# Patient Record
Sex: Male | Born: 1940 | Race: White | Hispanic: No | Smoking: Former smoker
Health system: Southern US, Community
[De-identification: ages and names within clinical notes are randomized; demographics above are authoritative.]

## PROBLEM LIST (undated history)

## (undated) DIAGNOSIS — I700 Atherosclerosis of aorta: Secondary | ICD-10-CM

## (undated) DIAGNOSIS — C20 Malignant neoplasm of rectum: Secondary | ICD-10-CM

## (undated) DIAGNOSIS — Z7952 Long term (current) use of systemic steroids: Secondary | ICD-10-CM

## (undated) DIAGNOSIS — I4891 Unspecified atrial fibrillation: Secondary | ICD-10-CM

## (undated) DIAGNOSIS — Z7901 Long term (current) use of anticoagulants: Secondary | ICD-10-CM

## (undated) DIAGNOSIS — Z7960 Long term (current) use of unspecified immunomodulators and immunosuppressants: Secondary | ICD-10-CM

## (undated) DIAGNOSIS — I10 Essential (primary) hypertension: Secondary | ICD-10-CM

## (undated) DIAGNOSIS — Z79899 Other long term (current) drug therapy: Secondary | ICD-10-CM

## (undated) DIAGNOSIS — I714 Abdominal aortic aneurysm, without rupture: Secondary | ICD-10-CM

## (undated) DIAGNOSIS — E039 Hypothyroidism, unspecified: Secondary | ICD-10-CM

## (undated) DIAGNOSIS — M069 Rheumatoid arthritis, unspecified: Secondary | ICD-10-CM

## (undated) DIAGNOSIS — K8689 Other specified diseases of pancreas: Secondary | ICD-10-CM

## (undated) DIAGNOSIS — I495 Sick sinus syndrome: Secondary | ICD-10-CM

## (undated) DIAGNOSIS — M359 Systemic involvement of connective tissue, unspecified: Secondary | ICD-10-CM

## (undated) HISTORY — DX: Malignant neoplasm of rectum: C20

## (undated) HISTORY — DX: Other specified diseases of pancreas: K86.89

## (undated) HISTORY — PX: COLONOSCOPY: SHX174

## (undated) HISTORY — DX: Hypothyroidism, unspecified: E03.9

## (undated) HISTORY — DX: Essential (primary) hypertension: I10

## (undated) HISTORY — DX: Rheumatoid arthritis, unspecified: M06.9

## (undated) HISTORY — DX: Abdominal aortic aneurysm, without rupture: I71.4

## (undated) HISTORY — DX: Unspecified atrial fibrillation: I48.91

---

## 1977-06-04 HISTORY — PX: VARICOSE VEIN SURGERY: SHX832

## 1998-06-04 DIAGNOSIS — C20 Malignant neoplasm of rectum: Secondary | ICD-10-CM

## 1998-06-04 HISTORY — PX: TRANSANAL RECTAL RESECTION: SHX2562

## 1998-06-04 HISTORY — DX: Malignant neoplasm of rectum: C20

## 2005-10-04 ENCOUNTER — Other Ambulatory Visit: Payer: Self-pay

## 2005-10-30 ENCOUNTER — Ambulatory Visit: Payer: Self-pay | Admitting: Unknown Physician Specialty

## 2007-03-06 ENCOUNTER — Ambulatory Visit: Payer: Self-pay | Admitting: Surgery

## 2011-08-10 ENCOUNTER — Encounter: Payer: Self-pay | Admitting: Internal Medicine

## 2011-08-28 ENCOUNTER — Encounter: Payer: Self-pay | Admitting: Internal Medicine

## 2011-08-29 ENCOUNTER — Ambulatory Visit (INDEPENDENT_AMBULATORY_CARE_PROVIDER_SITE_OTHER): Payer: Federal, State, Local not specified - PPO | Admitting: Internal Medicine

## 2011-08-29 ENCOUNTER — Encounter: Payer: Self-pay | Admitting: Internal Medicine

## 2011-08-29 ENCOUNTER — Telehealth: Payer: Self-pay | Admitting: Internal Medicine

## 2011-08-29 VITALS — BP 144/72 | HR 76 | Ht 75.0 in | Wt 198.0 lb

## 2011-08-29 DIAGNOSIS — Z85048 Personal history of other malignant neoplasm of rectum, rectosigmoid junction, and anus: Secondary | ICD-10-CM | POA: Insufficient documentation

## 2011-08-29 DIAGNOSIS — Z8679 Personal history of other diseases of the circulatory system: Secondary | ICD-10-CM | POA: Insufficient documentation

## 2011-08-29 DIAGNOSIS — Z1211 Encounter for screening for malignant neoplasm of colon: Secondary | ICD-10-CM

## 2011-08-29 DIAGNOSIS — I10 Essential (primary) hypertension: Secondary | ICD-10-CM | POA: Insufficient documentation

## 2011-08-29 DIAGNOSIS — M069 Rheumatoid arthritis, unspecified: Secondary | ICD-10-CM

## 2011-08-29 DIAGNOSIS — Z85038 Personal history of other malignant neoplasm of large intestine: Secondary | ICD-10-CM

## 2011-08-29 HISTORY — DX: Rheumatoid arthritis, unspecified: M06.9

## 2011-08-29 MED ORDER — PEG-KCL-NACL-NASULF-NA ASC-C 100 G PO SOLR: 1.0000 | Freq: Once | ORAL | Status: DC

## 2011-08-29 NOTE — Telephone Encounter (Signed)
Received 171 pages. Sent to Dr. Rhea Belton. SD 08/29/11.

## 2011-08-29 NOTE — Patient Instructions (Signed)
You have been scheduled for a colonoscopy with propofol. Please follow written instructions given to you at your visit today.  Please pick up your prep kit at the pharmacy within the next 1-3 days.     We have sent the following medications to your pharmacy for you to pick up at your convenience:Moviprep, please follow the instructions that were given to you today at your office visit.

## 2011-08-29 NOTE — Progress Notes (Signed)
Subjective:    Patient ID: Damon Torres, male    DOB: 12/04/1940, 71 y.o.   MRN: [id number]  HPI Damon Torres is a 72 year old male with a past medical history of rectal cancer status post resection, rheumatoid arthritis on Enbrel who is seen in consultation by the request of Dr. Dan Humphreys for evaluation of surveillance colonoscopy. The patient is currently doing well and he is without complaint. No abdominal pain, loss of appetite, nausea, vomiting. No diarrhea or constipation. No blood in the stool or melena. He was diagnosed with rectal cancer at [facility] in 2000 and underwent what sounds like a two-part surgery, with ileal diversion for some period. He then had secondary anastomosis. He reports having colonoscopies initially at year from his diagnosed, 3 years, and then every 5 years. He recalls be due for his surveillance colonoscopy now. No other complaints.  Review of Systems As per history of present illness, otherwise negative  Past Medical History  Diagnosis Date  . 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 Hypertension   . Rheumatoid arthritis    Past Surgical History  Procedure Date  . Transanal rectal resection 2000    [facility]  . Varicose vein surgery 1979   Current Outpatient Prescriptions  Medication Sig Dispense Refill  . amLODipine-benazepril (LOTREL) 5-10 MG per capsule Take 1 capsule by mouth daily.      [name] etanercept (ENBREL) 50 MG/ML injection Inject 50 mg into the skin once a week.      . peg 3350 powder (MOVIPREP) SOLR Take 1 kit (100 g total) by mouth once.  1 kit  0   No Known Allergies  Family History  Problem Relation Age of Onset  . Liver cancer Mother   . Breast cancer Mother     Social History  . Marital Status: Married    Number of Children: 2   Occupational History  . retired [employer]    Social History Main Topics  . Smoking status: Former Games developer  . Smokeless tobacco: None  . Alcohol Use: No  . Drug Use: No          Objective:   Physical Exam BP 144/72  Pulse 76  Ht 6\' 3"  (1.905 m)  Wt 198 lb (89.812 kg)  BMI 24.75 kg/m2 Constitutional: Well-developed and well-nourished. No distress. HEENT: Normocephalic and atraumatic. Oropharynx is clear and moist. No oropharyngeal exudate. Conjunctivae are normal. Pupils are equal round and reactive to light. No scleral icterus. Neck: Neck supple. Trachea midline. Cardiovascular: Normal rate, regular rhythm and intact distal pulses. No M/R/G Pulmonary/chest: Effort normal and breath sounds normal. No wheezing, rales or rhonchi. Abdominal: Soft, nontender, nondistended. Bowel sounds active throughout. There are no masses palpable. No hepatosplenomegaly. Well-healed multiple abdominal scars Extremities: no clubbing, cyanosis, or edema Lymphadenopathy: No cervical adenopathy noted. Neurological: Alert and oriented to person place and time. Skin: Skin is warm and dry. No rashes noted. Psychiatric: Normal mood and affect. Behavior is normal.  Previous records reviewed: Labs obtained, 08/07/2011 WBC 5.7, hemoglobin 12.1, hematocrit 35.8, MCV 97.8, platelets 185 Sodium 139, potassium 4.9, chloride 106, CO2 28.7, BUN 33, creatinine 1.2 Albumin 4.1 Alkaline phosphatase 47, bili 0.3, AST 22, ALT 17 Total protein 7.2  Colonoscopy, date 02/01/2003 -- : Colonic anastomosis in the rectum. The sigmoid, descending colon, splenic flexure, transverse colon, hepatic flexure, ascending colon, cecum, and ileocecal valve are normal.  Colonoscopy, date 11/10/1998 -- one sessile polyp, 4 mm in size, in the transverse  colon. Resected. Pathology: Tubular adenoma, no high-grade dysplasia  Colonoscopy, date 10/05/1999 -- colocolonic anastomosis in the rectum, otherwise normal.    Assessment & Plan:  71 year old male with a past medical history of rectal cancer status post resection, rheumatoid arthritis on Enbrel who is seen in consultation by the request of Dr. Dan Humphreys for evaluation  of surveillance colonoscopy  1. Hx of colon cancer -- the patient has a history of rectal cancer status post resection with colocolonic anastomosis. He is now due for surveillance colonoscopy, and we have discussed this test including the risks and benefits. He is agreeable to proceed. This will be scheduled for him in the coming weeks. He is asymptomatic at present. He is high risk given his history and therefore should continue with the surveillance interval every 5 yrs.

## 2011-09-24 ENCOUNTER — Ambulatory Visit (AMBULATORY_SURGERY_CENTER): Payer: Federal, State, Local not specified - PPO | Admitting: Internal Medicine

## 2011-09-24 ENCOUNTER — Encounter: Payer: Self-pay | Admitting: Internal Medicine

## 2011-09-24 VITALS — BP 154/92 | HR 73 | Temp 97.2°F | Resp 19 | Ht 75.0 in | Wt 198.0 lb

## 2011-09-24 DIAGNOSIS — Z85048 Personal history of other malignant neoplasm of rectum, rectosigmoid junction, and anus: Secondary | ICD-10-CM

## 2011-09-24 DIAGNOSIS — Z860100 Personal history of colon polyps, unspecified: Secondary | ICD-10-CM

## 2011-09-24 DIAGNOSIS — D126 Benign neoplasm of colon, unspecified: Secondary | ICD-10-CM

## 2011-09-24 DIAGNOSIS — K635 Polyp of colon: Secondary | ICD-10-CM

## 2011-09-24 DIAGNOSIS — Z1211 Encounter for screening for malignant neoplasm of colon: Secondary | ICD-10-CM

## 2011-09-24 MED ORDER — SODIUM CHLORIDE 0.9 % IV SOLN: 500.0000 mL | INTRAVENOUS | Status: DC

## 2011-09-24 NOTE — Progress Notes (Signed)
12:35 Molli Hazard, CRNA per Marrion Coy, CRNA hung second bag of normal saline 0.9% bag. Maw  The pt tolerated the colonoscopy very well. Maw

## 2011-09-24 NOTE — Patient Instructions (Signed)
HOLD ASPIRIN PRODUCTS AND ANTIINFLAMMATORIES FOR ONE Damon Torres, UNTIL April 29,2013.  YOU HAD AN ENDOSCOPIC PROCEDURE TODAY AT THE [facility]: Refer to the procedure report that was given to you for any specific questions about what was found during the examination.  If the procedure report does not answer your questions, please call your gastroenterologist to clarify.  If you requested that your care partner not be given the details of your procedure findings, then the procedure report has been included in a sealed envelope for you to review at your convenience later.  YOU SHOULD EXPECT: Some feelings of bloating in the abdomen. Passage of more gas than usual.  Walking can help get rid of the air that was put into your GI tract during the procedure and reduce the bloating. If you had a lower endoscopy (such as a colonoscopy or flexible sigmoidoscopy) you may notice spotting of blood in your stool or on the toilet paper. If you underwent a bowel prep for your procedure, then you may not have a normal bowel movement for a few days.  DIET: Your first meal following the procedure should be a light meal and then it is ok to progress to your normal diet.  A half-sandwich or bowl of soup is an example of a good first meal.  Heavy or fried foods are harder to digest and may make you feel nauseous or bloated.  Likewise meals heavy in dairy and vegetables can cause extra gas to form and this can also increase the bloating.  Drink plenty of fluids but you should avoid alcoholic beverages for 24 hours.  ACTIVITY: Your care partner should take you home directly after the procedure.  You should plan to take it easy, moving slowly for the rest of the day.  You can resume normal activity the day after the procedure however you should NOT DRIVE or use heavy machinery for 24 hours (because of the sedation medicines used during the test).    SYMPTOMS TO REPORT IMMEDIATELY: A gastroenterologist can be reached at  any hour.  During normal business hours, 8:30 AM to 5:00 PM Monday through Friday, call [phone].  After hours and on weekends, please call the GI answering service at [phone] who will take a message and have the physician on call contact you.   Following lower endoscopy (colonoscopy or flexible sigmoidoscopy):  Excessive amounts of blood in the stool  Significant tenderness or worsening of abdominal pains  Swelling of the abdomen that is new, acute  Fever of 100F or higher  Following upper endoscopy (EGD)  Vomiting of blood or coffee ground material  New chest pain or pain under the shoulder blades  Painful or persistently difficult swallowing  New shortness of breath  Fever of 100F or higher  Black, tarry-looking stools  FOLLOW UP: If any biopsies were taken you will be contacted by phone or by letter within the next 1-3 weeks.  Call your gastroenterologist if you have not heard about the biopsies in 3 weeks.  Our staff will call the home number listed on your records the next business day following your procedure to check on you and address any questions or concerns that you may have at that time regarding the information given to you following your procedure. This is a courtesy call and so if there is no answer at the home number and we have not heard from you through the emergency physician on call, we will assume that you have returned to  your regular daily activities without incident.  SIGNATURES/CONFIDENTIALITY: You and/or your care partner have signed paperwork which will be entered into your electronic medical record.  These signatures attest to the fact that that the information above on your After Visit Summary has been reviewed and is understood.  Full responsibility of the confidentiality of this discharge information lies with you and/or your care-partner.

## 2011-09-24 NOTE — Progress Notes (Signed)
Patient did not experience any of the following events: a burn prior to discharge; a fall within the facility; wrong site/side/patient/procedure/implant event; or a hospital transfer or hospital admission upon discharge from the facility. (G8907) Patient did not have preoperative order for IV antibiotic SSI prophylaxis. (G8918)  

## 2011-09-24 NOTE — Op Note (Signed)
[facility] [address] [place], [place]  [address]  COLONOSCOPY PROCEDURE REPORT  PATIENT:  Damon Torres, Damon Torres  MR#:  [id number] BIRTHDATE:  1941-01-22, 71 yrs. old  GENDER:  male ENDOSCOPIST:  Carie Caddy. Loukisha Gunnerson, MD REF. BY:  Ronna Polio, M.D. PROCEDURE DATE:  09/24/2011 PROCEDURE:  Colonoscopy with snare polypectomy ASA CLASS:  Class II INDICATIONS:  history of colon cancer, surveillance and high-risk screening MEDICATIONS:   MAC sedation, administered by CRNA, propofol (Diprivan) 350 mg IV  DESCRIPTION OF PROCEDURE:   After the risks benefits and alternatives of the procedure were thoroughly explained, informed consent was obtained.  Digital rectal exam was performed and revealed no rectal masses.   The LB CF-[id number] endoscope was introduced through the anus and advanced to the terminal ileum which was intubated for a short distance, without limitations. The quality of the prep was good, using MoviPrep.  The instrument was then slowly withdrawn as the colon was fully examined. <<PROCEDUREIMAGES>>  FINDINGS:  The terminal ileum appeared normal.  A 5 mm sessile polyp was found in the ascending colon. Polyp was snared without cautery. Retrieval was successful.  Mild diverticulosis was found in the left colon.  There was a surgical anastomosis in the rectum with very mild erythema, but was otherwise healthy appearing. Unable to retroflex due to prior surgery.   The scope was then withdrawn from the cecum and the procedure completed.  COMPLICATIONS:  None ENDOSCOPIC IMPRESSION: 1) Normal terminal ileum 2) Sessile polyp in the ascending colon. Removed and sent to pathology. 3) Mild diverticulosis in the left colon 4) Anastomosis in the rectum, healthy appearing.  RECOMMENDATIONS: 1) Hold aspirin, aspirin products, and anti-inflammatory medication for 1 week. 2) Await pathology results 3) High fiber diet. 4) Given your personal history rectal cancer and the  1 polyp removed today, you will need a repeat colonoscopy in 5 years.  Carie Caddy. Rhea Belton, MD  CC:  Ronna Polio MD The Patient  n. eSIGNEDCarie Caddy. Mathis Cashman at 09/24/2011 12:49 PM  Marcy Panning, [phone]

## 2011-09-25 ENCOUNTER — Telehealth: Payer: Self-pay

## 2011-09-25 NOTE — Telephone Encounter (Signed)
  Follow up Call-  Call back number 09/24/2011  Post procedure Call Back phone  # [phone]  Permission to leave phone message Yes     Patient questions:  Do you have a fever, pain , or abdominal swelling? no Pain Score  0 *  Have you tolerated food without any problems? yes  Have you been able to return to your normal activities? yes  Do you have any questions about your discharge instructions: Diet   no Medications  no Follow up visit  no  Do you have questions or concerns about your Care? no  Actions: * If pain score is 4 or above: No action needed, pain <4. Per the pt "I am fine no problems". Maw

## 2011-09-28 ENCOUNTER — Encounter: Payer: Self-pay | Admitting: Internal Medicine

## 2012-02-13 ENCOUNTER — Ambulatory Visit (INDEPENDENT_AMBULATORY_CARE_PROVIDER_SITE_OTHER): Payer: Federal, State, Local not specified - PPO | Admitting: Internal Medicine

## 2012-02-13 ENCOUNTER — Encounter: Payer: Self-pay | Admitting: Internal Medicine

## 2012-02-13 VITALS — BP 138/72 | HR 70 | Temp 98.0°F | Ht 74.0 in | Wt 183.0 lb

## 2012-02-13 DIAGNOSIS — M069 Rheumatoid arthritis, unspecified: Secondary | ICD-10-CM

## 2012-02-13 DIAGNOSIS — Z85048 Personal history of other malignant neoplasm of rectum, rectosigmoid junction, and anus: Secondary | ICD-10-CM

## 2012-02-13 DIAGNOSIS — Z23 Encounter for immunization: Secondary | ICD-10-CM

## 2012-02-13 DIAGNOSIS — I10 Essential (primary) hypertension: Secondary | ICD-10-CM

## 2012-02-13 MED ORDER — AMLODIPINE BESY-BENAZEPRIL HCL 5-10 MG PO CAPS: 1.0000 | ORAL_CAPSULE | Freq: Every day | ORAL | Status: DC

## 2012-02-13 NOTE — Assessment & Plan Note (Signed)
Has kept up with colonoscopies No symptoms Bowels are different since surgery---but okay

## 2012-02-13 NOTE — Assessment & Plan Note (Signed)
BP Readings from Last 3 Encounters:  02/13/12 138/72  09/24/11 154/92  08/29/11 144/72   Good control No problems with the meds Labs done at [facility] reviewed--looks fine from March

## 2012-02-13 NOTE — Assessment & Plan Note (Signed)
Doing well on embrel Really quiet Dr Gavin Potters manages this

## 2012-02-13 NOTE — Progress Notes (Signed)
  Subjective:    Patient ID: Damon Torres, male    DOB: July 02, 1940, 71 y.o.   MRN: [id number]  HPI Changing doctors from Dr Dan Humphreys  Had rectal cancer---surgery in 2000 Had ostomy and then reanastamosis Bowels are not regular since then but okay (skips days, then may go multiple times) Occ gets up at night with urgency--some sphincter control issues No pad but occ trouble holding it  Has high blood pressure Goes back 12-15 years Has done well with the current medication Usually well controlled  Rheumatoid arthritis diagnosed ~1990 Sees Dr Lavenia Atlas Now on embrel---has really helped him and he considers it controlled Hands were damaged but have stabilized Retains good function  Has problem with left foot 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       Wears seat belt  HENT: Positive for tinnitus. Negative for hearing loss, congestion, rhinorrhea and dental problem.        Regular with dentist  Eyes: Negative for visual disturbance.       No diplopia or unilateral vision loss  Respiratory: Negative for cough, chest tightness and shortness of breath.   Cardiovascular: Negative for chest pain, palpitations and leg swelling.  Gastrointestinal: Negative for nausea, vomiting, abdominal pain, constipation and blood in stool.       No heartburn  Genitourinary: Negative for urgency, frequency and difficulty urinating.       No sexual problems  Musculoskeletal: Positive for joint swelling and arthralgias. Negative for back pain.  Skin: Negative for rash.       No suspicious areas  Neurological: Negative for dizziness, syncope, weakness, light-headedness, numbness and headaches.  Hematological: Negative for adenopathy. Bruises/bleeds easily.  Psychiatric/Behavioral: Positive for disturbed wake/sleep cycle. Negative for  dysphoric mood. The patient is not nervous/anxious.        Not a great sleeper---chronic No daytime somnolence       Objective:   Physical Exam  Constitutional: He appears well-developed and well-nourished. No distress.  HENT:  Mouth/Throat: Oropharynx is clear and moist. No oropharyngeal exudate.  Eyes: Conjunctivae normal and EOM are normal. Pupils are equal, round, and reactive to light.  Neck: Normal range of motion. Neck supple. No thyromegaly present.  Cardiovascular: Normal rate, regular rhythm, normal heart sounds and intact distal pulses.  Exam reveals no gallop.   No murmur heard.      occ skip beats  Pulmonary/Chest: Effort normal and breath sounds normal. No respiratory distress. He has no wheezes. He has no rales.  Abdominal: Soft. There is no tenderness.  Musculoskeletal: He exhibits no edema and no tenderness.       Thickening in PIP of hands without active synovitis Bilateral bunions but no skin breakdown  Lymphadenopathy:    He has no cervical adenopathy.  Skin: No rash noted. No erythema.       Scattered ecchymoses  Psychiatric: He has a normal mood and affect. His behavior is normal. Thought content normal.          Assessment & Plan:

## 2012-02-19 NOTE — Progress Notes (Signed)
  Subjective:    Patient ID: Yves Fodor, male    DOB: 1941/04/03, 71 y.o.   MRN: [id number]  HPI  Current Outpatient Prescriptions on File Prior to Visit  Medication Sig Dispense Refill  . amLODipine-benazepril (LOTREL) 5-10 MG per capsule Take 1 capsule by mouth daily.  90 capsule  3  . etanercept (ENBREL) 50 MG/ML injection Inject 50 mg into the skin once a week.        No Known Allergies  Past Medical History  Diagnosis Date  . 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 Hypertension   . Rheumatoid arthritis     Past Surgical History  Procedure Date  . Transanal rectal resection 2000    [facility]  . Varicose vein surgery 1979  . Colonoscopy     Family History  Problem Relation Age of Onset  . Liver cancer Mother   . Breast cancer Mother   . Heart disease Mother   . Colon cancer Neg Hx   . Esophageal cancer Neg Hx   . Rectal cancer Neg Hx   . Stomach cancer Neg Hx   . Diabetes Neg Hx   . Heart disease Brother   . Hypertension Brother     History   Social History  . Marital Status: Married    Spouse Name: N/A    Number of Children: 2  . Years of Education: N/A   Occupational History  . 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 Smoking status: Former Games developer  . Smokeless tobacco: Never Used  . Alcohol Use: No  . Drug Use: No  . Sexually Active: Not on file   Other Topics Concern  . Not on file   Social History Narrative   No living willWould want wife to make health care decisionsInfo given on advanced directives 9/11/13Would accept resuscitation attemptsWould not want tube feeds if cognitively unaware     Review of Systems     Objective:   Physical Exam        Assessment & Plan:

## 2012-02-26 DIAGNOSIS — M069 Rheumatoid arthritis, unspecified: Secondary | ICD-10-CM

## 2012-02-26 DIAGNOSIS — M2012 Hallux valgus (acquired), left foot: Secondary | ICD-10-CM | POA: Insufficient documentation

## 2012-02-26 HISTORY — DX: Rheumatoid arthritis, unspecified: M06.9

## 2012-02-26 HISTORY — DX: Hallux valgus (acquired), left foot: M20.12

## 2012-06-04 DIAGNOSIS — E038 Other specified hypothyroidism: Secondary | ICD-10-CM

## 2012-06-04 HISTORY — DX: Other specified hypothyroidism: E03.8

## 2012-07-01 DIAGNOSIS — I10 Essential (primary) hypertension: Secondary | ICD-10-CM

## 2012-07-01 HISTORY — DX: Essential (primary) hypertension: I10

## 2012-07-05 HISTORY — PX: OTHER SURGICAL HISTORY: SHX169

## 2012-09-16 ENCOUNTER — Encounter: Payer: Federal, State, Local not specified - PPO | Admitting: Internal Medicine

## 2012-09-26 ENCOUNTER — Encounter: Payer: Self-pay | Admitting: Internal Medicine

## 2012-09-26 ENCOUNTER — Ambulatory Visit (INDEPENDENT_AMBULATORY_CARE_PROVIDER_SITE_OTHER): Payer: Federal, State, Local not specified - PPO | Admitting: Internal Medicine

## 2012-09-26 VITALS — BP 148/70 | HR 64 | Temp 98.6°F | Ht 75.0 in | Wt 188.0 lb

## 2012-09-26 DIAGNOSIS — Z0000 Encounter for general adult medical examination without abnormal findings: Secondary | ICD-10-CM | POA: Insufficient documentation

## 2012-09-26 DIAGNOSIS — I499 Cardiac arrhythmia, unspecified: Secondary | ICD-10-CM

## 2012-09-26 DIAGNOSIS — I10 Essential (primary) hypertension: Secondary | ICD-10-CM

## 2012-09-26 DIAGNOSIS — M069 Rheumatoid arthritis, unspecified: Secondary | ICD-10-CM

## 2012-09-26 DIAGNOSIS — R350 Frequency of micturition: Secondary | ICD-10-CM

## 2012-09-26 DIAGNOSIS — Z23 Encounter for immunization: Secondary | ICD-10-CM

## 2012-09-26 LAB — LIPID PANEL
Cholesterol: 189 mg/dL (ref 0–200)
LDL Cholesterol: 127 mg/dL — ABNORMAL HIGH (ref 0–99)
Total CHOL/HDL Ratio: 4
Triglycerides: 97 mg/dL (ref 0.0–149.0)
VLDL: 19.4 mg/dL (ref 0.0–40.0)

## 2012-09-26 LAB — HEPATIC FUNCTION PANEL
Alkaline Phosphatase: 56 U/L (ref 39–117)
Bilirubin, Direct: 0.1 mg/dL (ref 0.0–0.3)
Total Bilirubin: 0.5 mg/dL (ref 0.3–1.2)
Total Protein: 7.7 g/dL (ref 6.0–8.3)

## 2012-09-26 LAB — TSH: TSH: 16.1 u[IU]/mL — ABNORMAL HIGH (ref 0.35–5.50)

## 2012-09-26 LAB — BASIC METABOLIC PANEL
CO2: 24 mEq/L (ref 19–32)
Calcium: 9.2 mg/dL (ref 8.4–10.5)
Chloride: 100 mEq/L (ref 96–112)
Sodium: 135 mEq/L (ref 135–145)

## 2012-09-26 NOTE — Assessment & Plan Note (Signed)
BP Readings from Last 3 Encounters:  09/26/12 148/70  02/13/12 138/72  09/24/11 154/92   BP okay Hopefully can improve with better fitness efforts

## 2012-09-26 NOTE — Assessment & Plan Note (Signed)
EKG shows sinus with freq ectopy No action needed

## 2012-09-26 NOTE — Progress Notes (Signed)
Subjective:    Patient ID: Damon Torres, male    DOB: [birth year], 72 y.o.   MRN: [id number]  HPI Here for physical  Had surgery on right foot---toe pinning (with large toe having pin permanently) Still with some ankle swelling on the right---doesn't vary Mildly uncomfortable from the tightness  Still on enbrel from Dr Gavin Potters No recent blood work  Keeps up with colonoscopies  Current Outpatient Prescriptions on File Prior to Visit  Medication Sig Dispense Refill  . amLODipine-benazepril (LOTREL) 5-10 MG per capsule Take 1 capsule by mouth daily.  90 capsule  3  . etanercept (ENBREL) 50 MG/ML injection Inject 50 mg into the skin once a week.       No current facility-administered medications on file prior to visit.    No Known Allergies  Past Medical History  Diagnosis Date  . 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 Hypertension   . Rheumatoid arthritis     Past Surgical History  Procedure Laterality Date  . Transanal rectal resection  2000    [facility]  . Varicose vein surgery  1979  . Colonoscopy    . Right foot surgery Right 2/14    toes pinned    Family History  Problem Relation Age of Onset  . Liver cancer Mother   . Breast cancer Mother   . Heart disease Mother   . Colon cancer Neg Hx   . Esophageal cancer Neg Hx   . Rectal cancer Neg Hx   . Stomach cancer Neg Hx   . Diabetes Neg Hx   . Heart disease Brother   . Hypertension Brother     History   Social History  . Marital Status: Married    Spouse Name: N/A    Number of Children: 2  . Years of Education: N/A   Occupational History  . 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 Smoking status: Former Games developer  . Smokeless tobacco: Never Used  . Alcohol Use: No  . Drug Use: No  . Sexually Active: Not on file   Other Topics Concern  . Not on file   Social History Narrative   No living will   Would want wife to make health  care decisions   Would accept resuscitation attempts   Would not want tube feeds if cognitively unaware   Review of Systems  Constitutional: Negative for fatigue and unexpected weight change.       Weight stable Not going to Y due to foot surgery  HENT: Negative for hearing loss, congestion, rhinorrhea, dental problem and tinnitus.        Regular with dentist  Eyes: Negative for visual disturbance.       No diplopia or unilateral vision loss  Respiratory: Negative for cough, chest tightness and shortness of breath.   Cardiovascular: Positive for leg swelling. Negative for chest pain and palpitations.       Right ankle  Gastrointestinal: Negative for nausea, vomiting, abdominal pain, constipation and blood in stool.       No heartburn  Endocrine: Negative for cold intolerance and heat intolerance.  Genitourinary: Positive for frequency.       Slow stream Nocturia x 1 No sexual stream  Musculoskeletal: Negative for back pain, joint swelling and arthralgias.       Arthritis controlled on enbrel  Skin: Negative for rash.  No suspicious lesions  Allergic/Immunologic: Negative for environmental allergies and immunocompromised state.  Neurological: Negative for dizziness, syncope, weakness, light-headedness, numbness and headaches.  Hematological: Negative for adenopathy. Bruises/bleeds easily.  Psychiatric/Behavioral: Negative for sleep disturbance and dysphoric mood. The patient is not nervous/anxious.        Objective:   Physical Exam  Constitutional: He is oriented to person, place, and time. He appears well-developed and well-nourished. No distress.  HENT:  Head: Normocephalic and atraumatic.  Right Ear: External ear normal.  Left Ear: External ear normal.  Mouth/Throat: Oropharynx is clear and moist. No oropharyngeal exudate.  Eyes: Conjunctivae and EOM are normal. Pupils are equal, round, and reactive to light.  Neck: Normal range of motion. Neck supple. No thyromegaly  present.  Cardiovascular: Normal rate, normal heart sounds and intact distal pulses.  Exam reveals no gallop.   No murmur heard. Irregular (?regular with freq ectopy vs. Irregular)  Pulmonary/Chest: Effort normal and breath sounds normal. No respiratory distress. He has no wheezes. He has no rales.  Abdominal: Soft. There is no tenderness.  Musculoskeletal: He exhibits edema. He exhibits no tenderness.  Right ankle thick with minimal pitting  Lymphadenopathy:    He has no cervical adenopathy.  Neurological: He is alert and oriented to person, place, and time.  Skin: No rash noted. No erythema.  Scattered small ecchymotic areas  Psychiatric: He has a normal mood and affect. His behavior is normal.          Assessment & Plan:

## 2012-09-26 NOTE — Assessment & Plan Note (Signed)
Controlled with the enbrel 

## 2012-09-26 NOTE — Assessment & Plan Note (Signed)
Generally healthy Needs to get back to exercise now that foot is getting better Discussed PSA---will check one more time since it has been 10 years or so since his last check Td to update

## 2012-09-26 NOTE — Addendum Note (Signed)
Addended by: Sueanne Margarita on: 09/26/2012 10:50 AM   Modules accepted: Orders

## 2012-10-16 ENCOUNTER — Other Ambulatory Visit (INDEPENDENT_AMBULATORY_CARE_PROVIDER_SITE_OTHER): Payer: Federal, State, Local not specified - PPO

## 2012-10-16 DIAGNOSIS — E039 Hypothyroidism, unspecified: Secondary | ICD-10-CM

## 2012-10-17 ENCOUNTER — Encounter: Payer: Self-pay | Admitting: Internal Medicine

## 2012-10-21 MED ORDER — LEVOTHYROXINE SODIUM 25 MCG PO TABS: 25.0000 ug | ORAL_TABLET | Freq: Every day | ORAL | Status: DC

## 2012-10-21 NOTE — Addendum Note (Signed)
Addended by: Liane Comber C on: 10/21/2012 12:30 PM   Modules accepted: Orders

## 2012-12-31 ENCOUNTER — Other Ambulatory Visit (INDEPENDENT_AMBULATORY_CARE_PROVIDER_SITE_OTHER): Payer: Federal, State, Local not specified - PPO

## 2012-12-31 DIAGNOSIS — E039 Hypothyroidism, unspecified: Secondary | ICD-10-CM

## 2012-12-31 LAB — TSH: TSH: 12.98 u[IU]/mL — ABNORMAL HIGH (ref 0.35–5.50)

## 2013-01-05 ENCOUNTER — Other Ambulatory Visit: Payer: Self-pay | Admitting: *Deleted

## 2013-01-05 MED ORDER — LEVOTHYROXINE SODIUM 50 MCG PO TABS: 50.0000 ug | ORAL_TABLET | Freq: Every day | ORAL | Status: DC

## 2013-02-17 ENCOUNTER — Other Ambulatory Visit: Payer: Self-pay

## 2013-02-17 MED ORDER — AMLODIPINE BESY-BENAZEPRIL HCL 5-10 MG PO CAPS: 1.0000 | ORAL_CAPSULE | Freq: Every day | ORAL | Status: DC

## 2013-02-17 NOTE — Telephone Encounter (Signed)
Damon Torres left v/m requestinig refill amlodipine benazepril to [facility]. Advised Damon Fontanez done.

## 2013-04-04 DIAGNOSIS — I4891 Unspecified atrial fibrillation: Secondary | ICD-10-CM

## 2013-04-04 HISTORY — DX: Unspecified atrial fibrillation: I48.91

## 2013-04-09 ENCOUNTER — Other Ambulatory Visit: Payer: Self-pay

## 2013-04-12 DIAGNOSIS — I4891 Unspecified atrial fibrillation: Secondary | ICD-10-CM | POA: Insufficient documentation

## 2013-04-12 DIAGNOSIS — I4811 Longstanding persistent atrial fibrillation: Secondary | ICD-10-CM | POA: Insufficient documentation

## 2013-04-14 ENCOUNTER — Encounter: Payer: Self-pay | Admitting: Internal Medicine

## 2013-04-14 DIAGNOSIS — I4891 Unspecified atrial fibrillation: Secondary | ICD-10-CM

## 2013-06-08 DIAGNOSIS — E039 Hypothyroidism, unspecified: Secondary | ICD-10-CM | POA: Insufficient documentation

## 2013-06-08 DIAGNOSIS — Z7901 Long term (current) use of anticoagulants: Secondary | ICD-10-CM

## 2013-06-08 HISTORY — DX: Hypothyroidism, unspecified: E03.9

## 2013-06-08 HISTORY — DX: Long term (current) use of anticoagulants: Z79.01

## 2013-06-09 ENCOUNTER — Encounter: Payer: Self-pay | Admitting: Internal Medicine

## 2013-06-09 MED ORDER — LEVOTHYROXINE SODIUM 75 MCG PO TABS: 75.0000 ug | ORAL_TABLET | Freq: Every day | ORAL | Status: DC

## 2013-07-29 ENCOUNTER — Other Ambulatory Visit: Payer: Self-pay | Admitting: Internal Medicine

## 2013-07-29 DIAGNOSIS — I10 Essential (primary) hypertension: Secondary | ICD-10-CM

## 2013-07-31 ENCOUNTER — Telehealth: Payer: Self-pay | Admitting: Internal Medicine

## 2013-07-31 ENCOUNTER — Other Ambulatory Visit: Payer: Federal, State, Local not specified - PPO

## 2013-07-31 ENCOUNTER — Other Ambulatory Visit (INDEPENDENT_AMBULATORY_CARE_PROVIDER_SITE_OTHER): Payer: Federal, State, Local not specified - PPO

## 2013-07-31 DIAGNOSIS — I10 Essential (primary) hypertension: Secondary | ICD-10-CM

## 2013-07-31 LAB — COMPREHENSIVE METABOLIC PANEL
ALBUMIN: 3.9 g/dL (ref 3.5–5.2)
ALT: 15 U/L (ref 0–53)
AST: 20 U/L (ref 0–37)
Alkaline Phosphatase: 45 U/L (ref 39–117)
BUN: 27 mg/dL — ABNORMAL HIGH (ref 6–23)
CALCIUM: 9.7 mg/dL (ref 8.4–10.5)
CHLORIDE: 102 meq/L (ref 96–112)
CO2: 32 meq/L (ref 19–32)
CREATININE: 1.4 mg/dL (ref 0.4–1.5)
GFR: 53.69 mL/min — AB (ref >60.00)
GLUCOSE: 109 mg/dL — AB (ref 70–99)
POTASSIUM: 4.5 meq/L (ref 3.5–5.1)
Sodium: 137 mEq/L (ref 135–145)
Total Bilirubin: 0.4 mg/dL (ref 0.3–1.2)
Total Protein: 7.5 g/dL (ref 6.0–8.3)

## 2013-07-31 LAB — CBC WITH DIFFERENTIAL/PLATELET
Basophils Absolute: 0 10*3/uL (ref 0.0–0.1)
Basophils Relative: 0.7 % (ref 0.0–3.0)
EOS ABS: 0.3 10*3/uL (ref 0.0–0.7)
Eosinophils Relative: 6.2 % — ABNORMAL HIGH (ref 0.0–5.0)
HCT: 37.4 % — ABNORMAL LOW (ref 39.0–52.0)
Hemoglobin: 12.2 g/dL — ABNORMAL LOW (ref 13.0–17.0)
Lymphocytes Relative: 24.3 % (ref 12.0–46.0)
Lymphs Abs: 1.3 10*3/uL (ref 0.7–4.0)
MCHC: 32.5 g/dL (ref 30.0–36.0)
MCV: 102.3 fl — AB (ref 78.0–100.0)
MONO ABS: 0.6 10*3/uL (ref 0.1–1.0)
Monocytes Relative: 11.3 % (ref 3.0–12.0)
Neutro Abs: 3.2 10*3/uL (ref 1.4–7.7)
Neutrophils Relative %: 57.5 % (ref 43.0–77.0)
PLATELETS: 229 10*3/uL (ref 150.0–400.0)
RBC: 3.66 Mil/uL — AB (ref 4.22–5.81)
RDW: 12.8 % (ref 11.5–14.6)
WBC: 5.6 10*3/uL (ref 4.5–10.5)

## 2013-07-31 LAB — LIPID PANEL
CHOLESTEROL: 175 mg/dL (ref 0–200)
HDL: 49.9 mg/dL (ref >39.00)
LDL Cholesterol: 105 mg/dL — ABNORMAL HIGH (ref 0–99)
TRIGLYCERIDES: 99 mg/dL (ref 0.0–149.0)
Total CHOL/HDL Ratio: 4
VLDL: 19.8 mg/dL (ref 0.0–40.0)

## 2013-07-31 LAB — T4, FREE: Free T4: 0.97 ng/dL (ref 0.60–1.60)

## 2013-07-31 LAB — TSH: TSH: 13.44 u[IU]/mL — ABNORMAL HIGH (ref 0.35–5.50)

## 2013-07-31 NOTE — Telephone Encounter (Signed)
Relevant patient education assigned to patient using Emmi. ° °

## 2013-08-19 ENCOUNTER — Other Ambulatory Visit: Payer: Self-pay | Admitting: Internal Medicine

## 2013-08-19 NOTE — Telephone Encounter (Signed)
Last OV 09/2012.

## 2013-08-19 NOTE — Telephone Encounter (Signed)
rx sent to pharmacy by e-script  

## 2013-08-19 NOTE — Telephone Encounter (Signed)
Okay to fill for 6 months Needs physical within that time

## 2013-08-24 ENCOUNTER — Other Ambulatory Visit: Payer: Self-pay | Admitting: Internal Medicine

## 2013-10-02 ENCOUNTER — Encounter: Payer: Self-pay | Admitting: Internal Medicine

## 2013-10-02 ENCOUNTER — Ambulatory Visit (INDEPENDENT_AMBULATORY_CARE_PROVIDER_SITE_OTHER): Payer: Federal, State, Local not specified - PPO | Admitting: Internal Medicine

## 2013-10-02 VITALS — BP 138/80 | HR 67 | Temp 97.8°F | Ht 75.0 in | Wt 183.0 lb

## 2013-10-02 DIAGNOSIS — I10 Essential (primary) hypertension: Secondary | ICD-10-CM

## 2013-10-02 DIAGNOSIS — Z23 Encounter for immunization: Secondary | ICD-10-CM

## 2013-10-02 DIAGNOSIS — M069 Rheumatoid arthritis, unspecified: Secondary | ICD-10-CM

## 2013-10-02 DIAGNOSIS — Z0000 Encounter for general adult medical examination without abnormal findings: Secondary | ICD-10-CM

## 2013-10-02 DIAGNOSIS — I4891 Unspecified atrial fibrillation: Secondary | ICD-10-CM

## 2013-10-02 NOTE — Assessment & Plan Note (Signed)
Generally healthy No PSA due to age 73 today If off enbrel for some time, would give zostavax then

## 2013-10-02 NOTE — Assessment & Plan Note (Signed)
BP Readings from Last 3 Encounters:  10/02/13 138/80  09/26/12 148/70  02/13/12 138/72   Good control

## 2013-10-02 NOTE — Assessment & Plan Note (Signed)
Rate well controlled On xarelto

## 2013-10-02 NOTE — Assessment & Plan Note (Signed)
Seems to be in remission on the med

## 2013-10-02 NOTE — Progress Notes (Signed)
   Subjective:    Patient ID: Damon Torres, male    DOB: 12-14-1940, 73 y.o.   MRN: [id number]  HPI    Review of Systems     Objective:   Physical Exam  Musculoskeletal: He exhibits edema.  2+ left foot edema          Assessment & Plan:

## 2013-10-02 NOTE — Progress Notes (Signed)
Subjective:    Patient ID: Damon Torres, male    DOB: [birth year], 73 y.o.   MRN: [id number]  HPI Here for physical  EKG in November showed atrial fib Seen by Dr Waylan Boga metoprolol and xarelto  On enbrel for RA This is working well  Still works part time---appraisals. Own business Current Outpatient Prescriptions on File Prior to Visit  Medication Sig Dispense Refill  . amLODipine-benazepril (LOTREL) 5-10 MG per capsule TAKE 1 CAPSULE DAILY  90 capsule  0  . etanercept (ENBREL) 50 MG/ML injection Inject 50 mg into the skin once a week.      . levothyroxine (SYNTHROID, LEVOTHROID) 75 MCG tablet Take 1 tablet (75 mcg total) by mouth daily.  90 tablet  3   No current facility-administered medications on file prior to visit.    No Known Allergies  Past Medical History  Diagnosis Date  . 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 Hypertension   . Rheumatoid arthritis(714.0)   . Subclinical hypothyroidism 2014  . Atrial fibrillation 11/14    Past Surgical History  Procedure Laterality Date  . [facility]anal rectal resection  2000    [facility]  . Varicose vein surgery  1979  . Colonoscopy    . Right foot surgery Right 2/14    toes pinned    Family History  Problem Relation Age of Onset  . Liver cancer Mother   . Breast cancer Mother   . Heart disease Mother   . Colon cancer Neg Hx   . Esophageal cancer Neg Hx   . Rectal cancer Neg Hx   . Stomach cancer Neg Hx   . Diabetes Neg Hx   . Heart disease Brother   . Hypertension Brother     History   Social History  . Marital Status: Married    Spouse Name: N/A    Number of Children: 2  . Years of Education: N/A   Occupational History  . 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 Smoking status: Former Research scientist (life sciences)  . Smokeless tobacco: Never Used  . Alcohol Use: No  . Drug Use: No  . Sexual Activity: Not on file   Other Topics Concern  . Not  on file   Social History Narrative   No living will   Would want wife to make health care decisions   Would accept resuscitation attempts   Would not want tube feeds if cognitively unaware   Review of Systems  Constitutional: Negative for fatigue and unexpected weight change.       Wears seat belt  HENT: Positive for hearing loss. Negative for dental problem and tinnitus.        Regular with dentist  Eyes: Negative for visual disturbance.       No diplopia or unilateral vision loss  Respiratory: Negative for cough, chest tightness and shortness of breath.   Cardiovascular: Positive for leg swelling. Negative for chest pain and palpitations.       Swelling just from foot surgery  Gastrointestinal: Negative for nausea, vomiting, abdominal pain, constipation and blood in stool.       No heartburn  Endocrine: Negative for cold intolerance and heat intolerance.  Genitourinary: Negative for urgency, frequency and difficulty urinating.       No sexual problems  Musculoskeletal: Positive for arthralgias. Negative for back pain and joint swelling.  Still some foot soreness RA is quiet  Skin: Negative for rash.       No suspicious lesions  Allergic/Immunologic: Positive for immunocompromised state. Negative for environmental allergies.       On enbrel  Neurological: Negative for dizziness, syncope, weakness, light-headedness and headaches.  Hematological: Negative for adenopathy. Bruises/bleeds easily.  Psychiatric/Behavioral: Negative for sleep disturbance and dysphoric mood. The patient is not nervous/anxious.        Objective:   Physical Exam  Constitutional: He is oriented to person, place, and time. He appears well-developed and well-nourished. No distress.  HENT:  Head: Normocephalic and atraumatic.  Right Ear: External ear normal.  Left Ear: External ear normal.  Mouth/Throat: Oropharynx is clear and moist. No oropharyngeal exudate.  Eyes: Conjunctivae and EOM are  normal. Pupils are equal, round, and reactive to light.  Neck: Normal range of motion. Neck supple. No thyromegaly present.  Cardiovascular: Normal rate, normal heart sounds and intact distal pulses.  Exam reveals no gallop.   No murmur heard. irregular  Pulmonary/Chest: Effort normal and breath sounds normal. No respiratory distress. He has no wheezes. He has no rales.  Abdominal: Soft. There is no tenderness.  Musculoskeletal: He exhibits no edema and no tenderness.  Lymphadenopathy:    He has no cervical adenopathy.  Neurological: He is alert and oriented to person, place, and time.  Skin: No rash noted. No erythema.  Psychiatric: He has a normal mood and affect. His behavior is normal.          Assessment & Plan:

## 2013-10-02 NOTE — Addendum Note (Signed)
Addended by: Despina Hidden on: 10/02/2013 12:16 PM   Modules accepted: Orders

## 2013-10-02 NOTE — Progress Notes (Signed)
Pre visit review using our clinic review tool, if applicable. No additional management support is needed unless otherwise documented below in the visit note. 

## 2013-10-13 ENCOUNTER — Other Ambulatory Visit: Payer: Self-pay | Admitting: Internal Medicine

## 2014-01-12 ENCOUNTER — Ambulatory Visit (INDEPENDENT_AMBULATORY_CARE_PROVIDER_SITE_OTHER): Payer: Federal, State, Local not specified - PPO | Admitting: Internal Medicine

## 2014-01-12 ENCOUNTER — Encounter: Payer: Self-pay | Admitting: Internal Medicine

## 2014-01-12 VITALS — BP 130/70 | HR 64 | Temp 98.6°F | Wt 180.0 lb

## 2014-01-12 DIAGNOSIS — K6289 Other specified diseases of anus and rectum: Secondary | ICD-10-CM

## 2014-01-12 MED ORDER — HYDROCORTISONE 2.5 % RE CREA: 1.0000 "application " | TOPICAL_CREAM | Freq: Three times a day (TID) | RECTAL | Status: DC | PRN

## 2014-01-12 NOTE — Progress Notes (Signed)
Subjective:    Patient ID: RAEDYN WENKE, male    DOB: 1941/04/17, 73 y.o.   MRN: [id number]  HPI Has a sore rectum Not sure if outside or inside 3-4 weeks Constant sensation Sitz baths have helped Preparation H not much help  No blood in bowl or toilet paper Bowels are irregular for 15 years since the rectal cancer Usually goes several times per day and loose Rarely will have large BM and then not go for 2 days. No episodes where he had injury No pain with passing stool  Last colonoscopy was about 2 years ago  Current Outpatient Prescriptions on File Prior to Visit  Medication Sig Dispense Refill  . amLODipine-benazepril (LOTREL) 5-10 MG per capsule TAKE 1 CAPSULE DAILY  90 capsule  3  . calcium-vitamin D (OSCAL WITH D) 500-200 MG-UNIT per tablet Take 1 tablet by mouth daily with breakfast.      . etanercept (ENBREL) 50 MG/ML injection Inject 50 mg into the skin once a week.      . levothyroxine (SYNTHROID, LEVOTHROID) 75 MCG tablet Take 1 tablet (75 mcg total) by mouth daily.  90 tablet  3  . metoprolol succinate (TOPROL-XL) 25 MG 24 hr tablet Take 25 mg by mouth daily.       . Omega-3 Fatty Acids (FISH OIL) 1000 MG CAPS Take 1 capsule by mouth daily.      . rivaroxaban (XARELTO) 20 MG TABS tablet Take 20 mg by mouth daily with supper.       No current facility-administered medications on file prior to visit.    No Known Allergies  Past Medical History  Diagnosis Date  . 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 Hypertension   . Rheumatoid arthritis(714.0)   . Subclinical hypothyroidism 2014  . Atrial fibrillation 11/14    Past Surgical History  Procedure Laterality Date  . [facility]anal rectal resection  2000    [facility]  . Varicose vein surgery  1979  . Colonoscopy    . Right foot surgery Right 2/14    toes pinned    Family History  Problem Relation Age of Onset  . Liver cancer Mother   . Breast cancer Mother   . Heart disease Mother   . Colon cancer Neg Hx     . Esophageal cancer Neg Hx   . Rectal cancer Neg Hx   . Stomach cancer Neg Hx   . Diabetes Neg Hx   . Heart disease Brother   . Hypertension Brother     History   Social History  . Marital Status: Married    Spouse Name: N/A    Number of Children: 2  . Years of Education: N/A   Occupational History  . 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 Smoking status: Former Research scientist (life sciences)  . Smokeless tobacco: Never Used  . Alcohol Use: No  . Drug Use: No  . Sexual Activity: Not on file   Other Topics Concern  . Not on file   Social History Narrative   No living will   Would want wife to make health care decisions   Would accept resuscitation attempts   Would not want tube feeds if cognitively unaware   Review of Systems No fever Appetite is fine No abdominal pain Weight is stable    Objective:   Physical Exam  Constitutional: He appears well-developed  and well-nourished. No distress.  Abdominal: Soft. He exhibits no distension. There is no tenderness. There is no rebound and no guarding.  Genitourinary:  Mild circumferential irritation at rectum Very small rectal vault but no masses or tenderness No hemorrhoids          Assessment & Plan:

## 2014-01-12 NOTE — Assessment & Plan Note (Signed)
Looks only to be some irritation Will try anusol [facility] If ongoing symptoms, would set up with a local surgeon to check him (surgery 15 years ago and no signs of recurrent cancer--but would just want to make sure)--- Byrnett/Sankar

## 2014-01-12 NOTE — Patient Instructions (Signed)
If the rectal pain is not better in the next couple of weeks, please call for a referral to a surgeon (as we discussed).

## 2014-01-12 NOTE — Progress Notes (Signed)
Pre visit review using our clinic review tool, if applicable. No additional management support is needed unless otherwise documented below in the visit note. 

## 2014-04-23 ENCOUNTER — Ambulatory Visit (INDEPENDENT_AMBULATORY_CARE_PROVIDER_SITE_OTHER): Payer: Federal, State, Local not specified - PPO | Admitting: Internal Medicine

## 2014-04-23 ENCOUNTER — Encounter: Payer: Self-pay | Admitting: Internal Medicine

## 2014-04-23 VITALS — BP 130/80 | HR 66 | Temp 97.8°F | Wt 182.0 lb

## 2014-04-23 DIAGNOSIS — K529 Noninfective gastroenteritis and colitis, unspecified: Secondary | ICD-10-CM | POA: Insufficient documentation

## 2014-04-23 DIAGNOSIS — M79671 Pain in right foot: Secondary | ICD-10-CM | POA: Insufficient documentation

## 2014-04-23 MED ORDER — COLESEVELAM HCL 3.75 G PO PACK: 1.0000 | PACK | Freq: Two times a day (BID) | ORAL | Status: DC

## 2014-04-23 MED ORDER — LEVOTHYROXINE SODIUM 75 MCG PO TABS: 75.0000 ug | ORAL_TABLET | Freq: Every day | ORAL | Status: DC

## 2014-04-23 NOTE — Progress Notes (Signed)
Subjective:    Patient ID: Damon Torres, male    DOB: 1941/03/24, 73 y.o.   MRN: [id number]  HPI Having left foot pain January surgery to repair bunion--did well after that but still some tenderness When he wears a shoe--he has pain across mid foot Notes some veins dilated there Pain starts with walking--better if stops (and takes the pressure off)  Having "too many BMs"--loose but formed 10-20 stools per day---mostly at night  Will have incontinence at night if he is not quick Not as much during the day---doesn't bother him Has been off and on an issue since rectal surgery Worse in past 2 weeks No blood No pain  Current Outpatient Prescriptions on File Prior to Visit  Medication Sig Dispense Refill  . amLODipine-benazepril (LOTREL) 5-10 MG per capsule TAKE 1 CAPSULE DAILY 90 capsule 3  . calcium-vitamin D (OSCAL WITH D) 500-200 MG-UNIT per tablet Take 1 tablet by mouth daily with breakfast.    . etanercept (ENBREL) 50 MG/ML injection Inject 50 mg into the skin once a week.    . levothyroxine (SYNTHROID, LEVOTHROID) 75 MCG tablet Take 1 tablet (75 mcg total) by mouth daily. 90 tablet 3  . metoprolol succinate (TOPROL-XL) 25 MG 24 hr tablet Take 25 mg by mouth daily.     . Omega-3 Fatty Acids (FISH OIL) 1000 MG CAPS Take 1 capsule by mouth daily.    . rivaroxaban (XARELTO) 20 MG TABS tablet Take 20 mg by mouth daily with supper.     No current facility-administered medications on file prior to visit.    No Known Allergies  Past Medical History  Diagnosis Date  . Rectal cancer 2000    At [facility] -stage 3. RT and chemo also  . Hypertension   . Rheumatoid arthritis(714.0)   . Subclinical hypothyroidism 2014  . Atrial fibrillation 11/14    Past Surgical History  Procedure Laterality Date  . [facility]anal rectal resection  2000    [facility]  . Varicose vein surgery  1979  . Colonoscopy    . Right foot surgery Right 2/14    toes pinned    Family History  Problem Relation Age  of Onset  . Liver cancer Mother   . Breast cancer Mother   . Heart disease Mother   . Colon cancer Neg Hx   . Esophageal cancer Neg Hx   . Rectal cancer Neg Hx   . Stomach cancer Neg Hx   . Diabetes Neg Hx   . Heart disease Brother   . Hypertension Brother     History   Social History  . Marital Status: Married    Spouse Name: N/A    Number of Children: 2  . Years of Education: N/A   Occupational History  . 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 Smoking status: Former Research scientist (life sciences)  . Smokeless tobacco: Never Used  . Alcohol Use: No  . Drug Use: No  . Sexual Activity: Not on file   Other Topics Concern  . Not on file   Social History Narrative   No living will   Would want wife to make health care decisions   Would accept resuscitation attempts   Would not want tube feeds if cognitively unaware   Review of Systems No fever Appetite is fine Weight stable    Objective:   Physical Exam  Constitutional: He appears well-developed and  well-nourished. No distress.  Abdominal: Soft. He exhibits no distension. There is no tenderness. There is no rebound and no guarding.  Musculoskeletal:  No foot swelling or pain Ankle negative          Assessment & Plan:

## 2014-04-23 NOTE — Assessment & Plan Note (Signed)
Seems mechanical (slight venous dilation not involved) He will try better shoes (like [employer] sports) Consider eval by Dr Lorelei Pont

## 2014-04-23 NOTE — Patient Instructions (Signed)
Try the welchol once a day and increase to twice a day if your frequent bowel movements continue. If better shoes don't help the foot pain, set up an appointment with Dr Lorelei Pont here.

## 2014-04-23 NOTE — Assessment & Plan Note (Signed)
Seems functional No worrisome features

## 2014-05-11 MED ORDER — LEVOTHYROXINE SODIUM 75 MCG PO TABS: 75.0000 ug | ORAL_TABLET | Freq: Every day | ORAL | Status: DC

## 2014-05-11 NOTE — Addendum Note (Signed)
Addended by: Despina Hidden on: 05/11/2014 12:09 PM   Modules accepted: Orders

## 2014-06-01 ENCOUNTER — Encounter: Payer: Self-pay | Admitting: Internal Medicine

## 2014-06-01 MED ORDER — COLESEVELAM HCL 3.75 G PO PACK: 1.0000 | PACK | Freq: Two times a day (BID) | ORAL | Status: DC

## 2014-06-02 ENCOUNTER — Other Ambulatory Visit: Payer: Self-pay | Admitting: *Deleted

## 2014-06-02 MED ORDER — COLESEVELAM HCL 3.75 G PO PACK: 1.0000 | PACK | Freq: Every day | ORAL | Status: DC

## 2014-06-02 NOTE — Telephone Encounter (Signed)
Received fax from [facility] stating that welchol should be once daily and not twice daily. Spoke with patient and he takes once daily. i will resend back to [facility] corrected

## 2014-07-11 DIAGNOSIS — I7143 Infrarenal abdominal aortic aneurysm, without rupture: Secondary | ICD-10-CM

## 2014-07-11 HISTORY — DX: Infrarenal abdominal aortic aneurysm, without rupture: I71.43

## 2014-08-09 ENCOUNTER — Encounter: Payer: Self-pay | Admitting: Family Medicine

## 2014-08-09 ENCOUNTER — Ambulatory Visit (INDEPENDENT_AMBULATORY_CARE_PROVIDER_SITE_OTHER): Payer: Federal, State, Local not specified - PPO | Admitting: Family Medicine

## 2014-08-09 VITALS — BP 110/60 | HR 63 | Temp 98.3°F | Ht 75.0 in | Wt 183.0 lb

## 2014-08-09 DIAGNOSIS — M069 Rheumatoid arthritis, unspecified: Secondary | ICD-10-CM

## 2014-08-09 DIAGNOSIS — M7742 Metatarsalgia, left foot: Secondary | ICD-10-CM

## 2014-08-09 NOTE — Progress Notes (Signed)
Dr. Frederico Hamman T. Nohealani Medinger, MD, [facility] [place], [address] Phone: [phone] Fax: [phone]  08/09/2014  Patient: Damon Torres, MRN: [id number], DOB: 1940-09-18, 74 y.o.  Primary Physician:  Viviana Simpler, MD  Chief Complaint: Foot Pain  Subjective:   KEREM GILMER is a 74 y.o. very pleasant male patient who presents with the following:  Pleasant gentleman with a long-standing history of rheumatoid arthritis who is on Enbrel for 15 years, and he also within the last 2 years had some foot surgery where he has had functional fusion of the great toe bilaterally as well as some additional forefoot surgery, which appears to be of the third and fifth.  Operative notes are not available.  He today reports having some pain primarily in the forefoot on the left.  He is not having significant numbness or tingling.  He has not had any trauma or accident that he can recall.  B bunionectomy L midfoot is a little swollen.   GW Felipa Evener is Rheum, currently on Enbrel.   R hindfoot valgus L great toe fusion L midfoot pain  Metatarsal bar  Past Medical History, Surgical History, Social History, Family History, Problem List, Medications, and Allergies have been reviewed and updated if relevant.  GEN: No fevers, chills. Nontoxic. Primarily MSK c/o today. MSK: Detailed in the HPI GI: tolerating PO intake without difficulty Neuro: No numbness, parasthesias, or tingling associated. Otherwise the pertinent positives of the ROS are noted above.   Objective:   BP 110/60 mmHg  Pulse 63  Temp(Src) 98.3 F (36.8 C) (Oral)  Ht 6\' 3"  (1.905 m)  Wt 183 lb (83.008 kg)  BMI 22.87 kg/m2   GEN: WDWN, NAD, Non-toxic, Alert & Oriented x 3 HEENT: Atraumatic, Normocephalic.  Ears and Nose: No external deformity. EXTR: No clubbing/cyanosis/edema PSYCH: Normally interactive. Conversant. Not depressed or anxious appearing.  Calm  demeanor.   FEET: B Echymosis: no Edema: no ROM: full LE B Gait: heel toe, non-antalgic, dramatic pronation on the R MT pain: no Callus pattern: none Lateral Mall: NT Medial Mall: NT Talus: NT Navicular: NT Cuboid: NT Calcaneous: NT Metatarsals: L TTP with squeeze, not along shaft 5th MT: NT Phalanges: NT Achilles: NT Plantar Fascia: NT Fat Pad: NT Peroneals: NT Post Tib: NT Great Toe: NO MOTION Ant Drawer: neg ATFL: NT CFL: NT Deltoid: NT Other foot breakdown: none Long arch: R > L Long arch collapse Transverse arch: prior collapse with post-op changes Hindfoot breakdown: dramatic R hindfoot valgus with too many toes sign, L minimal Sensation: intact   Radiology: No results found.  Assessment and Plan:   Metatarsalgia of left foot  Rheumatoid arthritis  >25 minutes spent in face to face time with patient, >50% spent in counselling or coordination of care:  Interestingly, the patient has more foot breakdown on the right, particularly in the longitudinal arch as well as the hindfoot.  He is having all of his symptoms on the left, so I decided to make no correction on the right.  He functionally has a fused first MTP bilaterally secondary to operation.  It is possible that this is creating some translated force into the forefoot laterally and more proximal.  I wanted to start with something basic, and I created him a metatarsal bar to unload this region on a basic sports insole.  He felt that this felt better immediately.  I gave him an extra sheet of poron to try  on other shoes.  I appreciate the opportunity to evaluate this very friendly patient. If you have any question regarding her care or prognosis, do not hesitate to ask.   Signed,  Maud Deed. Chiquetta Langner, MD   Patient's Medications  New Prescriptions   No medications on file  Previous Medications   AMLODIPINE-BENAZEPRIL (LOTREL) 5-10 MG PER CAPSULE    TAKE 1 CAPSULE DAILY   CALCIUM-VITAMIN D (OSCAL WITH D)  500-200 MG-UNIT PER TABLET    Take 1 tablet by mouth daily with breakfast.   COLESEVELAM HCL (WELCHOL) 3.75 G PACK    Take 1 packet by mouth daily.   ETANERCEPT (ENBREL) 50 MG/ML INJECTION    Inject 50 mg into the skin once a week.   LEVOTHYROXINE (SYNTHROID, LEVOTHROID) 75 MCG TABLET    Take 1 tablet (75 mcg total) by mouth daily. PT WOULD LIKE GENERIC   METOPROLOL SUCCINATE (TOPROL-XL) 25 MG 24 HR TABLET    Take 25 mg by mouth daily.    OMEGA-3 FATTY ACIDS (FISH OIL) 1000 MG CAPS    Take 1 capsule by mouth daily.   RIVAROXABAN (XARELTO) 20 MG TABS TABLET    Take 20 mg by mouth daily with supper.  Modified Medications   No medications on file  Discontinued Medications   No medications on file

## 2014-08-09 NOTE — Progress Notes (Signed)
Pre visit review using our clinic review tool, if applicable. No additional management support is needed unless otherwise documented below in the visit note. 

## 2014-08-30 DIAGNOSIS — M19072 Primary osteoarthritis, left ankle and foot: Secondary | ICD-10-CM

## 2014-08-30 HISTORY — DX: Primary osteoarthritis, left ankle and foot: M19.072

## 2014-10-05 ENCOUNTER — Ambulatory Visit (INDEPENDENT_AMBULATORY_CARE_PROVIDER_SITE_OTHER): Payer: Federal, State, Local not specified - PPO | Admitting: Internal Medicine

## 2014-10-05 ENCOUNTER — Encounter: Payer: Self-pay | Admitting: Internal Medicine

## 2014-10-05 VITALS — BP 128/70 | HR 65 | Temp 97.7°F | Ht 75.0 in | Wt 179.0 lb

## 2014-10-05 DIAGNOSIS — M069 Rheumatoid arthritis, unspecified: Secondary | ICD-10-CM | POA: Diagnosis not present

## 2014-10-05 DIAGNOSIS — Z0000 Encounter for general adult medical examination without abnormal findings: Secondary | ICD-10-CM | POA: Diagnosis not present

## 2014-10-05 DIAGNOSIS — I4819 Other persistent atrial fibrillation: Secondary | ICD-10-CM

## 2014-10-05 DIAGNOSIS — I481 Persistent atrial fibrillation: Secondary | ICD-10-CM | POA: Diagnosis not present

## 2014-10-05 DIAGNOSIS — I10 Essential (primary) hypertension: Secondary | ICD-10-CM

## 2014-10-05 DIAGNOSIS — E039 Hypothyroidism, unspecified: Secondary | ICD-10-CM

## 2014-10-05 DIAGNOSIS — E038 Other specified hypothyroidism: Secondary | ICD-10-CM

## 2014-10-05 LAB — COMPREHENSIVE METABOLIC PANEL
ALT: 23 U/L (ref 0–53)
AST: 25 U/L (ref 0–37)
Albumin: 3.7 g/dL (ref 3.5–5.2)
Alkaline Phosphatase: 47 U/L (ref 39–117)
BUN: 23 mg/dL (ref 6–23)
CALCIUM: 9.4 mg/dL (ref 8.4–10.5)
CO2: 29 mEq/L (ref 19–32)
CREATININE: 1.41 mg/dL (ref 0.40–1.50)
Chloride: 106 mEq/L (ref 96–112)
GFR: 52.21 mL/min — ABNORMAL LOW (ref >60.00)
GLUCOSE: 88 mg/dL (ref 70–99)
POTASSIUM: 4.1 meq/L (ref 3.5–5.1)
Sodium: 138 mEq/L (ref 135–145)
Total Bilirubin: 0.4 mg/dL (ref 0.2–1.2)
Total Protein: 7.4 g/dL (ref 6.0–8.3)

## 2014-10-05 LAB — CBC WITH DIFFERENTIAL/PLATELET
BASOS ABS: 0 10*3/uL (ref 0.0–0.1)
Basophils Relative: 0.6 % (ref 0.0–3.0)
EOS PCT: 3.9 % (ref 0.0–5.0)
Eosinophils Absolute: 0.2 10*3/uL (ref 0.0–0.7)
HCT: 36.1 % — ABNORMAL LOW (ref 39.0–52.0)
Hemoglobin: 12.5 g/dL — ABNORMAL LOW (ref 13.0–17.0)
LYMPHS PCT: 19.1 % (ref 12.0–46.0)
Lymphs Abs: 1.1 10*3/uL (ref 0.7–4.0)
MCHC: 34.5 g/dL (ref 30.0–36.0)
MCV: 98.9 fl (ref 78.0–100.0)
MONOS PCT: 12.4 % — AB (ref 3.0–12.0)
Monocytes Absolute: 0.7 10*3/uL (ref 0.1–1.0)
NEUTROS PCT: 64 % (ref 43.0–77.0)
Neutro Abs: 3.6 10*3/uL (ref 1.4–7.7)
PLATELETS: 208 10*3/uL (ref 150.0–400.0)
RBC: 3.66 Mil/uL — ABNORMAL LOW (ref 4.22–5.81)
RDW: 13.5 % (ref 11.5–15.5)
WBC: 5.6 10*3/uL (ref 4.0–10.5)

## 2014-10-05 LAB — LIPID PANEL
CHOL/HDL RATIO: 4
CHOLESTEROL: 175 mg/dL (ref 0–200)
HDL: 47.5 mg/dL (ref >39.00)
LDL Cholesterol: 114 mg/dL — ABNORMAL HIGH (ref 0–99)
NonHDL: 127.5
TRIGLYCERIDES: 67 mg/dL (ref 0.0–149.0)
VLDL: 13.4 mg/dL (ref 0.0–40.0)

## 2014-10-05 LAB — T4, FREE: FREE T4: 0.88 ng/dL (ref 0.60–1.60)

## 2014-10-05 NOTE — Assessment & Plan Note (Signed)
Good rate control On xarelto 

## 2014-10-05 NOTE — Assessment & Plan Note (Signed)
Joints quiet and no pain on Rx

## 2014-10-05 NOTE — Assessment & Plan Note (Signed)
BP Readings from Last 3 Encounters:  10/05/14 128/70  08/09/14 110/60  04/23/14 130/80   Good control Due for labs

## 2014-10-05 NOTE — Progress Notes (Signed)
Subjective:    Patient ID: Damon Torres, male    DOB: 30-May-1941, 74 y.o.   MRN: [id number]  HPI Here for physical  Reviewed foot pain Has had cortisone shots-- helped some Orthotics not indicated Seems better now  On enbrel for his RA Working well Last visit with Dr Jefm Bryant in December  Dr Naida Sleight at [facility] is cardiologist No heart issues No palpitations  No chest pain or SOB No exercise---discussed. He stays busy with yard and house work (here and place in [place])  No more follow up from rectal cancer Last colonoscopy 4/13 with Dr Pyrtle--due 5 years  Current Outpatient Prescriptions on File Prior to Visit  Medication Sig Dispense Refill  . amLODipine-benazepril (LOTREL) 5-10 MG per capsule TAKE 1 CAPSULE DAILY 90 capsule 3  . calcium-vitamin D (OSCAL WITH D) 500-200 MG-UNIT per tablet Take 1 tablet by mouth daily with breakfast.    . etanercept (ENBREL) 50 MG/ML injection Inject 50 mg into the skin once a week.    . metoprolol succinate (TOPROL-XL) 25 MG 24 hr tablet Take 25 mg by mouth daily.     . Omega-3 Fatty Acids (FISH OIL) 1000 MG CAPS Take 1 capsule by mouth daily.    . rivaroxaban (XARELTO) 20 MG TABS tablet Take 20 mg by mouth daily with supper.     No current facility-administered medications on file prior to visit.    No Known Allergies  Past Medical History  Diagnosis Date  . Rectal cancer 2000    At [facility] -stage 3. RT and chemo also  . Hypertension   . Rheumatoid arthritis(714.0)   . Subclinical hypothyroidism 2014  . Atrial fibrillation 11/14    Past Surgical History  Procedure Laterality Date  . [facility]  2000    [facility]  . Varicose vein surgery  1979  . Colonoscopy    . Right foot surgery Right 2/14    toes pinned    Family History  Problem Relation Age of Onset  . Liver cancer Mother   . Breast cancer Mother   . Heart disease Mother   . Colon cancer Neg Hx   . Esophageal cancer Neg Hx   . Rectal cancer Neg Hx   .  Stomach cancer Neg Hx   . Diabetes Neg Hx   . Heart disease Brother   . Hypertension Brother     History   Social History  . Marital Status: Married    Spouse Name: N/A  . Number of Children: 2  . Years of Education: N/A   Occupational History  . 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 Smoking status: Former Research scientist (life sciences)  . Smokeless tobacco: Never Used  . Alcohol Use: No  . Drug Use: No  . Sexual Activity: Not on file   Other Topics Concern  . Not on file   Social History Narrative   No living will   Would want wife to make health care decisions   Would accept resuscitation attempts   Would not want tube feeds if cognitively unaware   Review of Systems  Constitutional: Negative for fatigue and unexpected weight change.       Wears seat belt  HENT: Negative for dental problem, hearing loss and tinnitus.        Keeps up with dentist  Eyes: Negative for visual disturbance.  No diplopia or unilateral vision loss  Gastrointestinal: Negative for nausea, vomiting, abdominal pain, constipation and blood in stool.       No heartburn Still with fecal frequency since his surgery--but no incontinence  Endocrine: Negative for polydipsia and polyuria.  Genitourinary: Negative for urgency, frequency and difficulty urinating.       No sexual problems  Musculoskeletal: Negative for back pain, joint swelling and arthralgias.  Skin: Negative for rash.       No suspicious lesions  Allergic/Immunologic: Negative for environmental allergies and immunocompromised state.  Neurological: Negative for dizziness, syncope, weakness, light-headedness, numbness and headaches.  Hematological: Negative for adenopathy. Bruises/bleeds easily.  Psychiatric/Behavioral: Negative for sleep disturbance and dysphoric mood. The patient is not nervous/anxious.        Objective:   Physical Exam  Constitutional: He appears  well-developed and well-nourished. No distress.  HENT:  Head: Normocephalic and atraumatic.  Right Ear: External ear normal.  Left Ear: External ear normal.  Mouth/Throat: Oropharynx is clear and moist. No oropharyngeal exudate.  Eyes: Conjunctivae and EOM are normal. Pupils are equal, round, and reactive to light.  Neck: Normal range of motion. Neck supple. No thyromegaly present.  Cardiovascular: Normal rate, normal heart sounds and intact distal pulses.  Exam reveals no gallop.   No murmur heard. irregular  Pulmonary/Chest: Effort normal and breath sounds normal. No respiratory distress. He has no wheezes. He has no rales.  Abdominal: Soft. There is no tenderness.  Musculoskeletal: He exhibits no tenderness.  Trace ankle edema  Lymphadenopathy:    He has no cervical adenopathy.  Skin: No rash noted. No erythema.  Psychiatric: He has a normal mood and affect. His behavior is normal.          Assessment & Plan:

## 2014-10-05 NOTE — Assessment & Plan Note (Signed)
Healthy but should work on fitness No zostavax due to enbrel No PSA due to age Colonoscopy due 2018

## 2014-10-05 NOTE — Assessment & Plan Note (Signed)
Persistently high TSH even with meds---but normal free T4 Will recheck

## 2014-10-05 NOTE — Progress Notes (Signed)
Pre visit review using our clinic review tool, if applicable. No additional management support is needed unless otherwise documented below in the visit note. 

## 2014-11-06 ENCOUNTER — Other Ambulatory Visit: Payer: Self-pay | Admitting: Internal Medicine

## 2014-11-08 ENCOUNTER — Encounter: Payer: Self-pay | Admitting: Internal Medicine

## 2014-12-03 ENCOUNTER — Encounter: Admission: RE | Payer: Self-pay | Source: Ambulatory Visit

## 2014-12-03 ENCOUNTER — Ambulatory Visit
Admission: RE | Admit: 2014-12-03 | Payer: Federal, State, Local not specified - PPO | Source: Ambulatory Visit | Admitting: Gastroenterology

## 2014-12-03 SURGERY — COLONOSCOPY WITH PROPOFOL
Anesthesia: General

## 2015-01-11 ENCOUNTER — Encounter: Payer: Self-pay | Admitting: Internal Medicine

## 2015-01-11 ENCOUNTER — Ambulatory Visit (INDEPENDENT_AMBULATORY_CARE_PROVIDER_SITE_OTHER): Payer: Federal, State, Local not specified - PPO | Admitting: Internal Medicine

## 2015-01-11 VITALS — BP 128/70 | HR 63 | Temp 98.6°F | Wt 174.0 lb

## 2015-01-11 DIAGNOSIS — K529 Noninfective gastroenteritis and colitis, unspecified: Secondary | ICD-10-CM | POA: Diagnosis not present

## 2015-01-11 DIAGNOSIS — E039 Hypothyroidism, unspecified: Secondary | ICD-10-CM | POA: Diagnosis not present

## 2015-01-11 LAB — TSH: TSH: 9.03 u[IU]/mL — ABNORMAL HIGH (ref 0.35–4.50)

## 2015-01-11 LAB — T4, FREE: Free T4: 1.07 ng/dL (ref 0.60–1.60)

## 2015-01-11 NOTE — Assessment & Plan Note (Signed)
He seems to be euthryoid Not sure that this could be related to his loose stools I don't think I would increase the dose unless TSH is over 10

## 2015-01-11 NOTE — Progress Notes (Signed)
   Subjective:    Patient ID: Damon Torres, male    DOB: 1941-04-12, 74 y.o.   MRN: [id number]  HPI Saw Dr Rayann Heman at [facility] for chronic diarrhea No difference on the creon Now planning on going ahead with the colonoscopy  He is wondering if the thyroid is okay and whether that could have anything to do with the diarrhea  Weight is slowing going down Trying to eat more though  No change in skin, hair or nails  Current Outpatient Prescriptions on File Prior to Visit  Medication Sig Dispense Refill  . amLODipine-benazepril (LOTREL) 5-10 MG per capsule TAKE 1 CAPSULE DAILY 90 capsule 3  . calcium-vitamin D (OSCAL WITH D) 500-200 MG-UNIT per tablet Take 1 tablet by mouth daily with breakfast.    . etanercept (ENBREL) 50 MG/ML injection Inject 50 mg into the skin once a week.    . levothyroxine (SYNTHROID, LEVOTHROID) 75 MCG tablet Take 75 mcg by mouth daily before breakfast.    . metoprolol succinate (TOPROL-XL) 25 MG 24 hr tablet Take 25 mg by mouth daily.     . Omega-3 Fatty Acids (FISH OIL) 1000 MG CAPS Take 1 capsule by mouth daily.    . rivaroxaban (XARELTO) 20 MG TABS tablet Take 20 mg by mouth daily with supper.     No current facility-administered medications on file prior to visit.    No Known Allergies  Past Medical History  Diagnosis Date  . Rectal cancer 2000    At [facility] -stage 3. RT and chemo also  . Hypertension   . Rheumatoid arthritis(714.0)   . Subclinical hypothyroidism 2014  . Atrial fibrillation 11/14    Past Surgical History  Procedure Laterality Date  . Transanal rectal resection  2000    [facility]  . Varicose vein surgery  1979  . Colonoscopy    . Right foot surgery Right 2/14    toes pinned    Family History  Problem Relation Age of Onset  . Liver cancer Mother   . Breast cancer Mother   . Heart disease Mother   . Colon cancer Neg Hx   . Esophageal cancer Neg Hx   . Rectal cancer Neg Hx   . Stomach cancer Neg Hx   . Diabetes Neg Hx   . Heart  disease Brother   . Hypertension Brother     History   Social History  . Marital Status: Married    Spouse Name: N/A  . Number of Children: 2  . Years of Education: N/A   Occupational History  . 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 Smoking status: Former Research scientist (life sciences)  . Smokeless tobacco: Never Used  . Alcohol Use: No  . Drug Use: No  . Sexual Activity: Not on file   Other Topics Concern  . Not on file   Social History Narrative   No living will   Would want wife to make health care decisions   Would accept resuscitation attempts   Would not want tube feeds if cognitively unaware   Review of Systems Not sleeping well---due to loose stools that wake him up  Some fecal incontinence at night--needs Attends then    Objective:   Physical Exam        Assessment & Plan:

## 2015-01-11 NOTE — Assessment & Plan Note (Signed)
With weight loss Currently undergoing GI evaluation

## 2015-01-11 NOTE — Progress Notes (Signed)
Pre visit review using our clinic review tool, if applicable. No additional management support is needed unless otherwise documented below in the visit note. 

## 2015-01-27 ENCOUNTER — Encounter
Admission: RE | Disposition: A | Discharge status: Home / Self Care | Payer: Self-pay | Source: Ambulatory Visit | Attending: Gastroenterology

## 2015-01-27 ENCOUNTER — Encounter: Payer: Self-pay | Admitting: Anesthesiology

## 2015-01-27 ENCOUNTER — Ambulatory Visit
Admission: RE | Admit: 2015-01-27 | Discharge: 2015-01-27 | Disposition: A | Discharge status: Home / Self Care | Payer: Federal, State, Local not specified - PPO | Source: Ambulatory Visit | Attending: Gastroenterology | Admitting: Gastroenterology

## 2015-01-27 ENCOUNTER — Ambulatory Visit: Payer: Federal, State, Local not specified - PPO | Admitting: Anesthesiology

## 2015-01-27 DIAGNOSIS — M069 Rheumatoid arthritis, unspecified: Secondary | ICD-10-CM | POA: Diagnosis not present

## 2015-01-27 DIAGNOSIS — I10 Essential (primary) hypertension: Secondary | ICD-10-CM | POA: Diagnosis not present

## 2015-01-27 DIAGNOSIS — Z85048 Personal history of other malignant neoplasm of rectum, rectosigmoid junction, and anus: Secondary | ICD-10-CM | POA: Insufficient documentation

## 2015-01-27 DIAGNOSIS — K635 Polyp of colon: Secondary | ICD-10-CM | POA: Diagnosis not present

## 2015-01-27 DIAGNOSIS — E02 Subclinical iodine-deficiency hypothyroidism: Secondary | ICD-10-CM | POA: Diagnosis not present

## 2015-01-27 DIAGNOSIS — Z803 Family history of malignant neoplasm of breast: Secondary | ICD-10-CM | POA: Diagnosis not present

## 2015-01-27 DIAGNOSIS — Z87891 Personal history of nicotine dependence: Secondary | ICD-10-CM | POA: Insufficient documentation

## 2015-01-27 DIAGNOSIS — R197 Diarrhea, unspecified: Secondary | ICD-10-CM | POA: Diagnosis present

## 2015-01-27 DIAGNOSIS — K6389 Other specified diseases of intestine: Secondary | ICD-10-CM | POA: Diagnosis not present

## 2015-01-27 DIAGNOSIS — I4891 Unspecified atrial fibrillation: Secondary | ICD-10-CM | POA: Insufficient documentation

## 2015-01-27 DIAGNOSIS — Z9221 Personal history of antineoplastic chemotherapy: Secondary | ICD-10-CM | POA: Diagnosis not present

## 2015-01-27 DIAGNOSIS — K529 Noninfective gastroenteritis and colitis, unspecified: Secondary | ICD-10-CM | POA: Insufficient documentation

## 2015-01-27 DIAGNOSIS — Z800 Family history of malignant neoplasm of digestive organs: Secondary | ICD-10-CM | POA: Insufficient documentation

## 2015-01-27 HISTORY — PX: COLONOSCOPY WITH PROPOFOL: SHX5780

## 2015-01-27 SURGERY — COLONOSCOPY WITH PROPOFOL
Anesthesia: General

## 2015-01-27 MED ORDER — SODIUM CHLORIDE 0.9 % IV SOLN: INTRAVENOUS | Status: DC

## 2015-01-27 MED ORDER — PHENYLEPHRINE HCL 10 MG/ML IJ SOLN
INTRAMUSCULAR | Status: DC | PRN
  Administered 2015-01-27 (×2): 100 ug via INTRAVENOUS

## 2015-01-27 MED ORDER — SODIUM CHLORIDE 0.9 % IV SOLN
INTRAVENOUS | Status: DC
  Administered 2015-01-27: 1000 mL via INTRAVENOUS

## 2015-01-27 MED ORDER — PROPOFOL 10 MG/ML IV BOLUS
INTRAVENOUS | Status: DC | PRN
  Administered 2015-01-27: 60 mg via INTRAVENOUS

## 2015-01-27 MED ORDER — PROPOFOL INFUSION 10 MG/ML OPTIME
INTRAVENOUS | Status: DC | PRN
  Administered 2015-01-27: 200 ug/kg/min via INTRAVENOUS

## 2015-01-27 MED ORDER — LIDOCAINE HCL (CARDIAC) 20 MG/ML IV SOLN
INTRAVENOUS | Status: DC | PRN
  Administered 2015-01-27: 60 mg via INTRAVENOUS

## 2015-01-27 NOTE — Transfer of Care (Signed)
Immediate Anesthesia Transfer of Care Note  Patient: Damon Torres  Procedure(s) Performed: Procedure(s): COLONOSCOPY WITH PROPOFOL (N/A)  Patient Location: PACU  Anesthesia Type:General  Level of Consciousness: awake  Airway & Oxygen Therapy: Patient Spontanous Breathing and Patient connected to nasal cannula oxygen  Post-op Assessment: Report given to RN and Post -op Vital signs reviewed and stable  Post vital signs: Reviewed and stable  Last Vitals:  Filed Vitals:   01/27/15 1039  BP: 108/66  Pulse: 73  Temp: 35.9 C  Resp: 18    Complications: No apparent anesthesia complications

## 2015-01-27 NOTE — H&P (Signed)
Primary Care Physician:  Viviana Simpler, MD  Pre-Procedure History & Physical: HPI:  Damon Torres is a 74 y.o. male is here for an colonoscopy.   Past Medical History  Diagnosis Date  . Rectal cancer 2000    At [facility] -stage 3. RT and chemo also  . Hypertension   . Rheumatoid arthritis(714.0)   . Subclinical hypothyroidism 2014  . Atrial fibrillation 11/14    Past Surgical History  Procedure Laterality Date  . [facility]anal rectal resection  2000    [facility]  . Varicose vein surgery  1979  . Colonoscopy    . Right foot surgery Right 2/14    toes pinned    Prior to Admission medications   Medication Sig Start Date End Date Taking? Authorizing Provider  amLODipine-benazepril (LOTREL) 5-10 MG per capsule TAKE 1 CAPSULE DAILY 11/06/14  Yes Venia Carbon, MD  calcium-vitamin D (OSCAL WITH D) 500-200 MG-UNIT per tablet Take 1 tablet by mouth daily with breakfast.   Yes Historical Provider, MD  etanercept (ENBREL) 50 MG/ML injection Inject 50 mg into the skin once a week.   Yes Historical Provider, MD  levothyroxine (SYNTHROID, LEVOTHROID) 75 MCG tablet Take 75 mcg by mouth daily before breakfast.   Yes Historical Provider, MD  metoprolol succinate (TOPROL-XL) 25 MG 24 hr tablet Take 25 mg by mouth daily.  09/10/13  Yes Historical Provider, MD  Omega-3 Fatty Acids (FISH OIL) 1000 MG CAPS Take 1 capsule by mouth daily.   Yes Historical Provider, MD  Pancrelipase, Lip-Prot-Amyl, [id number] UNITS CPEP Take by mouth. 12/01/14 12/01/15 Yes Historical Provider, MD  rivaroxaban (XARELTO) 20 MG TABS tablet Take 20 mg by mouth daily with supper.   Yes Historical Provider, MD    Allergies as of 01/26/2015  . (No Known Allergies)    Family History  Problem Relation Age of Onset  . Liver cancer Mother   . Breast cancer Mother   . Heart disease Mother   . Colon cancer Neg Hx   . Esophageal cancer Neg Hx   . Rectal cancer Neg Hx   . Stomach cancer Neg Hx   . Diabetes Neg Hx   . Heart disease Brother    . Hypertension Brother     Social History   Social History  . Marital Status: Married    Spouse Name: N/A  . Number of Children: 2  . Years of Education: N/A   Occupational History  . 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 Smoking status: Former Research scientist (life sciences)  . Smokeless tobacco: Never Used  . Alcohol Use: No  . Drug Use: No  . Sexual Activity: Not on file   Other Topics Concern  . Not on file   Social History Narrative   No living will   Would want wife to make health care decisions   Would accept resuscitation attempts   Would not want tube feeds if cognitively unaware     Physical Exam: BP 124/80 mmHg  Pulse 85  Temp(Src) 96.8 F (36 C) (Tympanic)  Resp 16  Ht 6\' 3"  (1.905 m)  Wt 78.019 kg (172 lb)  BMI 21.50 kg/m2  SpO2 100% General:   Alert,  pleasant and cooperative in NAD Head:  Normocephalic and atraumatic. Neck:  Supple; no masses or thyromegaly. Lungs:  Clear throughout to auscultation.    Heart:  Regular rate and rhythm. Abdomen:  Soft, nontender  and nondistended. Normal bowel sounds, without guarding, and without rebound.   Neurologic:  Alert and  oriented x4;  grossly normal neurologically.  Impression/Plan: Damon Torres is here for an colonoscopy to be performed for chronic diarrhea  Risks, benefits, limitations, and alternatives regarding  colonoscopy have been reviewed with the patient.  Questions have been answered.  All parties agreeable.   Josefine Class, MD  01/27/2015, 10:05 AM

## 2015-01-27 NOTE — Discharge Instructions (Signed)

## 2015-01-27 NOTE — Anesthesia Preprocedure Evaluation (Signed)
Anesthesia Evaluation  Patient identified by MRN, date of birth, ID band Patient awake    Reviewed: Allergy & Precautions, NPO status , Patient's Chart, lab work & pertinent test results, reviewed documented beta blocker date and time   Airway Mallampati: II  TM Distance: >3 FB     Dental  (+) Partial Lower, Upper Dentures, Chipped   Pulmonary former smoker,          Cardiovascular hypertension, Pt. on medications and Pt. on home beta blockers     Neuro/Psych    GI/Hepatic   Endo/Other  Hypothyroidism   Renal/GU      Musculoskeletal  (+) Arthritis -, Rheumatoid disorders,    Abdominal   Peds  Hematology   Anesthesia Other Findings   Reproductive/Obstetrics                             Anesthesia Physical Anesthesia Plan  ASA: III  Anesthesia Plan: General   Post-op Pain Management:    Induction:   Airway Management Planned: Nasal Cannula  Additional Equipment:   Intra-op Plan:   Post-operative Plan:   Informed Consent: I have reviewed the patients History and Physical, chart, labs and discussed the procedure including the risks, benefits and alternatives for the proposed anesthesia with the patient or authorized representative who has indicated his/her understanding and acceptance.     Plan Discussed with: CRNA  Anesthesia Plan Comments:         Anesthesia Quick Evaluation

## 2015-01-27 NOTE — Op Note (Signed)
[facility] Gastroenterology Patient Name: Damon Torres Procedure Date: 01/27/2015 9:59 AM MRN: [id number] Account #: [id number] Date of Birth: 1940-08-01 Admit Type: Outpatient Age: 75 Room: [facility] ENDO ROOM 1 Gender: Male Note Status: Finalized Procedure:         Colonoscopy Indications:       Last colonoscopy 3 years ago, Chronic diarrhea Patient Profile:   This is a 74 year old male. Providers:         Gerrit Heck. Rayann Heman, MD Referring MD:      Venia Carbon, MD (Referring MD) Medicines:         Propofol per Anesthesia Complications:     No immediate complications. Procedure:         Pre-Anesthesia Assessment:                    - Prior to the procedure, a History and Physical was                     performed, and patient medications, allergies and                     sensitivities were reviewed. The patient's tolerance of                     previous anesthesia was reviewed.                    After obtaining informed consent, the colonoscope was                     passed under direct vision. Throughout the procedure, the                     patient's blood pressure, pulse, and oxygen saturations                     were monitored continuously. The Colonoscope was                     introduced through the anus and advanced to the the                     terminal ileum. The colonoscopy was performed without                     difficulty. The patient tolerated the procedure well. The                     quality of the bowel preparation was excellent. Findings:      The perianal and digital rectal examinations were normal.      The terminal ileum appeared normal.      There was evidence of a prior end-to-end colo-rectal anastomosis in the       rectum. This was patent. This was characterized by healthy appearing       mucosa. This was traversed.      A 2 mm polyp was found in the proximal transverse colon. The polyp was       sessile. The polyp was removed  with a jumbo cold forceps. Resection and       retrieval were complete.      Biopsies for histology were taken with a cold forceps from the right       colon, left colon and rectum for evaluation of microscopic colitis.  Impression:        - The examined portion of the ileum was normal.                    - Patent end-to-end colo-rectal anastomosis, characterized                     by healthy appearing mucosa.                    - One 2 mm polyp in the proximal transverse colon.                     Resected and retrieved.                    - Biopsies were taken with a cold forceps from the right                     colon, left colon and rectum for evaluation of microscopic                     colitis.                    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 Recommendation:    - Observe patient in GI recovery unit.                    - Resume regular diet.                    - Return to GI clinic. Consdier trial of xifaxin, 24 hour                     stool collection for lytes and osms, fecal fat.                    - Continue present medications.                    - Await pathology results.                    - Repeat colonoscopy in 5 years for surveillance.                    - The findings and recommendations were discussed with the                     patient.                    - The findings and recommendations were discussed with the                     patient's family. Procedure Code(s): --- Professional ---                    [phone], Colonoscopy, flexible; with biopsy, single or                     multiple CPT copyright 2014 American Medical Association. All rights reserved. The codes documented in this report are preliminary and upon coder review may  be revised to meet current compliance requirements. Mellody Life, MD 01/27/2015 10:37:18 AM This report has been signed electronically. Number of Addenda: 0 Note Initiated On: 01/27/2015 9:59 AM Scope Withdrawal  Time: 0 hours 12  minutes 49 seconds  Total Procedure Duration: 0 hours 14 minutes 44 seconds       [facility]

## 2015-01-28 ENCOUNTER — Other Ambulatory Visit: Payer: Self-pay | Admitting: Gastroenterology

## 2015-01-28 DIAGNOSIS — R197 Diarrhea, unspecified: Secondary | ICD-10-CM

## 2015-01-28 DIAGNOSIS — R634 Abnormal weight loss: Secondary | ICD-10-CM

## 2015-01-28 NOTE — Anesthesia Postprocedure Evaluation (Signed)
  Anesthesia Post-op Note  Patient: Damon Torres  Procedure(s) Performed: Procedure(s): COLONOSCOPY WITH PROPOFOL (N/A)  Anesthesia type:General  Patient location: PACU  Post pain: Pain level controlled  Post assessment: Post-op Vital signs reviewed, Patient's Cardiovascular Status Stable, Respiratory Function Stable, Patent Airway and No signs of Nausea or vomiting  Post vital signs: Reviewed and stable  Last Vitals:  Filed Vitals:   01/27/15 1110  BP: 130/91  Pulse: 64  Temp:   Resp: 18    Level of consciousness: awake, alert  and patient cooperative  Complications: No apparent anesthesia complications

## 2015-01-31 LAB — SURGICAL PATHOLOGY

## 2015-02-09 ENCOUNTER — Ambulatory Visit
Admission: RE | Admit: 2015-02-09 | Discharge: 2015-02-09 | Disposition: A | Discharge status: Home / Self Care | Payer: Federal, State, Local not specified - PPO | Source: Ambulatory Visit | Attending: Gastroenterology | Admitting: Gastroenterology

## 2015-02-09 DIAGNOSIS — R634 Abnormal weight loss: Secondary | ICD-10-CM | POA: Diagnosis present

## 2015-02-09 DIAGNOSIS — N281 Cyst of kidney, acquired: Secondary | ICD-10-CM | POA: Insufficient documentation

## 2015-02-09 DIAGNOSIS — J849 Interstitial pulmonary disease, unspecified: Secondary | ICD-10-CM | POA: Insufficient documentation

## 2015-02-09 DIAGNOSIS — R197 Diarrhea, unspecified: Secondary | ICD-10-CM

## 2015-02-09 DIAGNOSIS — K529 Noninfective gastroenteritis and colitis, unspecified: Secondary | ICD-10-CM | POA: Insufficient documentation

## 2015-02-09 DIAGNOSIS — I714 Abdominal aortic aneurysm, without rupture: Secondary | ICD-10-CM | POA: Diagnosis not present

## 2015-02-09 HISTORY — DX: Systemic involvement of connective tissue, unspecified: M35.9

## 2015-02-09 MED ORDER — IOHEXOL 350 MG/ML SOLN
100.0000 mL | Freq: Once | INTRAVENOUS | Status: AC | PRN
  Administered 2015-02-09: 100 mL via INTRAVENOUS

## 2015-03-29 ENCOUNTER — Other Ambulatory Visit: Payer: Self-pay | Admitting: *Deleted

## 2015-03-29 MED ORDER — LEVOTHYROXINE SODIUM 75 MCG PO TABS: 75.0000 ug | ORAL_TABLET | Freq: Every day | ORAL | Status: DC

## 2015-05-05 DIAGNOSIS — I714 Abdominal aortic aneurysm, without rupture, unspecified: Secondary | ICD-10-CM

## 2015-05-05 HISTORY — DX: Abdominal aortic aneurysm, without rupture, unspecified: I71.40

## 2015-05-05 HISTORY — DX: Abdominal aortic aneurysm, without rupture: I71.4

## 2015-05-09 ENCOUNTER — Other Ambulatory Visit: Payer: Self-pay | Admitting: Gastroenterology

## 2015-05-09 DIAGNOSIS — K8689 Other specified diseases of pancreas: Secondary | ICD-10-CM

## 2015-05-17 ENCOUNTER — Ambulatory Visit: Payer: Federal, State, Local not specified - PPO | Admitting: Physical Therapy

## 2015-05-31 ENCOUNTER — Ambulatory Visit
Admission: RE | Admit: 2015-05-31 | Discharge: 2015-05-31 | Disposition: A | Discharge status: Home / Self Care | Payer: Federal, State, Local not specified - PPO | Source: Ambulatory Visit | Attending: Gastroenterology | Admitting: Gastroenterology

## 2015-05-31 DIAGNOSIS — K8689 Other specified diseases of pancreas: Secondary | ICD-10-CM

## 2015-05-31 DIAGNOSIS — N281 Cyst of kidney, acquired: Secondary | ICD-10-CM | POA: Diagnosis not present

## 2015-05-31 DIAGNOSIS — I714 Abdominal aortic aneurysm, without rupture: Secondary | ICD-10-CM | POA: Insufficient documentation

## 2015-05-31 MED ORDER — GADOBENATE DIMEGLUMINE 529 MG/ML IV SOLN
20.0000 mL | Freq: Once | INTRAVENOUS | Status: AC | PRN
  Administered 2015-05-31: 16 mL via INTRAVENOUS

## 2015-06-14 ENCOUNTER — Encounter: Payer: Self-pay | Admitting: Internal Medicine

## 2015-06-14 DIAGNOSIS — I714 Abdominal aortic aneurysm, without rupture, unspecified: Secondary | ICD-10-CM | POA: Insufficient documentation

## 2015-06-14 DIAGNOSIS — K8689 Other specified diseases of pancreas: Secondary | ICD-10-CM

## 2015-08-11 LAB — HM SIGMOIDOSCOPY: HM SIGMOIDOSCOPY: NORMAL

## 2015-08-15 ENCOUNTER — Ambulatory Visit: Payer: Federal, State, Local not specified - PPO | Attending: Gastroenterology | Admitting: Physical Therapy

## 2015-08-15 DIAGNOSIS — M62838 Other muscle spasm: Secondary | ICD-10-CM | POA: Diagnosis present

## 2015-08-15 DIAGNOSIS — R293 Abnormal posture: Secondary | ICD-10-CM

## 2015-08-15 DIAGNOSIS — R531 Weakness: Secondary | ICD-10-CM | POA: Insufficient documentation

## 2015-08-15 NOTE — Patient Instructions (Signed)
Pelvic tilting in sidelying (pillow between knees)  And also in sitting  10 x 3 / day

## 2015-08-16 NOTE — Therapy (Signed)
[place] MAIN [facility] SERVICES [address] [place], [place], [address] Phone: [phone]   Fax:  [phone]  Physical Therapy Evaluation  Patient Details  Name: Damon Torres MRN: [id number] Date of Birth: Aug 20, 1940 Referring Provider: Rayann Heman   Encounter Date: 08/15/2015      PT End of Session - 08/16/15 1945    Visit Number 1   Number of Visits 12   Date for PT Re-Evaluation 11/07/15   PT Start Time 1000   PT Stop Time 1105   PT Time Calculation (min) 65 min   Activity Tolerance Patient tolerated treatment well;No increased pain   Behavior During Therapy [facility] for tasks assessed/performed      Past Medical History  Diagnosis Date  . Rectal cancer ([place]) 2000    At [facility] -stage 3. RT and chemo also  . Hypertension   . Rheumatoid arthritis(714.0)   . Subclinical hypothyroidism 2014  . Atrial fibrillation ([place]) 11/14  . Collagen vascular disease ([place])   . AAA (abdominal aortic aneurysm) ([place]) 12/16    4cm infrarenal  . Pancreatic insufficiency [facility])     Past Surgical History  Procedure Laterality Date  . Transanal rectal resection  2000    [facility]  . Varicose vein surgery  1979  . Colonoscopy    . Right foot surgery Right 2/14    toes pinned  . Colonoscopy with propofol N/A 01/27/2015    Procedure: COLONOSCOPY WITH PROPOFOL;  Surgeon: Josefine Class, MD;  Location: [facility] ENDOSCOPY;  Service: Endoscopy;  Laterality: N/A;    There were no vitals filed for this visit.  Visit Diagnosis:  Poor posture - Plan: PT plan of care cert/re-cert  Muscle spasm - Plan: PT plan of care cert/re-cert  Weakness generalized - Plan: PT plan of care cert/re-cert      Subjective Assessment - 08/16/15 2000    Subjective Pt was Dx w/ rectal CA in 2000 and underwent radiation and chemothoerapy. Since, that time, pt experienced frequent bowel  movements 8-15 x/ day for 3x /week. Over the past 9 months, this bowel frequency had worsened with a  need to wear Depends and 3-4x/week.  At this time, pt found out his pancreas stopped working and now he has to take enzymes for digestion. Currently, pt no longer has to wear Depends but pt continues have frequent bowel movements 8-12x/ for 3-4 x week. Pt learned he had lesions in the anal opening which is the cause for constant pain 3-4/10 (12/10 with bowel momvents). Pt learned he did not have colitis nor Crohns. Bowel movements [place] Stool 3-4, occasionally 6.  Over the past month, pt reported he experiences mm spasms in the rectum. Pt also reports fecal incontinence with every sitz bath (2 x/day as a routine for comfort). Pt also stated he has had fecal incontinence 30% over the past months.  Denied hemorrhoids. Blood in stool for the past months (MD is aware).  This pain and these Sx have impacted pt from performing hobbies and and ADLs. Pt does not "feel like it".  Pt has lost 30 pounds.       Pertinent History  Pt enjoys performing yard work, housework, work on cars. No increased pain with these activities. Hx of RA.     Patient Stated Goals 1) get better             [facility] PT Assessment - 08/16/15 1942    Assessment   Medical Diagnosis K52.9, K62.89, M62.89  Referring Provider REIN    Precautions   Precautions None   Restrictions   Weight Bearing Restrictions No   Observation/Other Assessments   Observations severe forward head, increased thoracic kyphosis, posterior tilt of pelvis    Other Surveys  --  COREFO 28%   Coordination   Gross Motor Movements are Fluid and Coordinated --  limited diaphragmatic excursion    Single Leg Stance   Comments unable to perform bilaterally without UE support    Posture/Postural Control   Posture Comments lumbopelvic instability w/ ASLR                  Pelvic Floor Special Questions - 08/16/15 1938    External Perineal Exam Noted small dried fecal pieces inside gluteal cleft      Skin Integrity --  redness w/ pimples around  anus, skin intact; no open wounds   Pelvic Floor Internal Exam pt consented verbally. Withholding deep rectal assessment due to Hx of radiation therapy to the area    Exam Type Rectal   Palpation EAS , strong contraction. Noted increased mm tensions posterior aspect. Decreased with sustained pressure and guided pelvic tilts and breathing                   PT Education - 08/16/15 1945    Education provided Yes   Education Details POC, goals, anatomy, physiology   Person(s) Educated Patient   Methods Explanation;Demonstration;Tactile cues;Verbal cues;Handout   Comprehension Verbalized understanding;Returned demonstration             PT Long Term Goals - 08/16/15 1953    PT LONG TERM GOAL #1   Title Pt will decrease his COREFO score from 28% to < 20% in order to demo improved pelvic floor function.   Time 12   Period Weeks   Status New   PT LONG TERM GOAL #2   Title Pt will demo proper upright sitting posture across 2 visits to decrease pressure on rectum and improve pelvic floor function.    Time 12   Period Weeks   Status New   PT LONG TERM GOAL #3   Title Pt will demo IND with HEP    Time 12   Period Weeks   Status New   PT LONG TERM GOAL #4   Title Pt will demo > 20 sec on SLS bilaterally in order to decrease risk for falls.   Time 12   Period Weeks   Status New               Plan - 08/16/15 1946    Clinical Impression Statement Pt is a 75 yo male who complains of fecal  incontinence, urgency, and frequency. Pt's clinical presentations include  rectal mm tensions, posterior pelvic tilt, increased thoracic kyphosis,  forward head,  weakness in his hips,  weak coordination/ strength of deep  core mm with habits that place strain on his pelvic floor mm.  Pt's  personal factors that contribute to his Sx include Hx of rectal CA and  radiation/ chemo treatment. Pt is considering surgery to resolve his Sx  and will be discussing further with Dr. Rayann Heman  re: option. Pt demo'd  decreased mm tensions in his rectum and improved diaphragmatic breathing with pelvic floor lengthening post-Tx.      Pt will benefit from skilled therapeutic intervention in order to improve on the following deficits Abnormal gait;Pain;Improper body mechanics;Hypermobility;Decreased coordination;Decreased activity tolerance;Decreased endurance;Hypomobility;Decreased range of motion;Decreased balance;Impaired flexibility;Increased muscle spasms;Postural  dysfunction;Decreased mobility   Rehab Potential Fair   PT Frequency 1x / week   PT Duration 12 weeks   PT Treatment/Interventions ADLs/Self Care Home Management;Gait training;Fluidtherapy;Aquatic Therapy;Biofeedback;Cryotherapy;Parrafin;Moist Heat;Traction;Stair training;Functional mobility training;Therapeutic activities;Therapeutic exercise;Balance training;Neuromuscular re-education;Manual techniques;Patient/family education;Scar mobilization;Passive range of motion;Energy conservation;Taping   Consulted and Agree with Plan of Care Patient         Problem List Patient Active Problem List   Diagnosis Date Noted  . AAA (abdominal aortic aneurysm) ([place])   . Pancreatic insufficiency ([place])   . Hypothyroidism   . Frequent stools 04/23/2014  . Atrial fibrillation ([place])   . Routine general medical examination at a health care facility 09/26/2012  . History of rectal cancer 08/29/2011  . Rheumatoid arthritis ([place]) 08/29/2011  . Hypertension 08/29/2011  . History of varicose veins 08/29/2011    Jerl Mina ,PT, DPT, E-RYT  08/16/2015, 8:00 PM  [place] MAIN [facility] SERVICES [address] [place], [place], [address] Phone: [phone]   Fax:  [phone]  Name: KARTIER ATER MRN: [id number] Date of Birth: 1941/06/01

## 2015-08-22 ENCOUNTER — Ambulatory Visit: Payer: Federal, State, Local not specified - PPO | Admitting: Physical Therapy

## 2015-08-22 DIAGNOSIS — R531 Weakness: Secondary | ICD-10-CM

## 2015-08-22 DIAGNOSIS — R293 Abnormal posture: Secondary | ICD-10-CM | POA: Diagnosis not present

## 2015-08-22 DIAGNOSIS — M62838 Other muscle spasm: Secondary | ICD-10-CM

## 2015-08-22 NOTE — Therapy (Signed)
[place] MAIN [facility] SERVICES [address] [place], [place], [address] Phone: [phone]   Fax:  [phone]  Physical Therapy Treatment  Patient Details  Name: Damon Torres MRN: [id number] Date of Birth: [birth year] Referring Provider: Rayann Heman   Encounter Date: 08/22/2015      PT End of Session - 08/22/15 1105    Visit Number 2   Number of Visits 12   Date for PT Re-Evaluation 11/07/15   PT Start Time 1005   PT Stop Time 1105   PT Time Calculation (min) 60 min   Activity Tolerance Patient tolerated treatment well;No increased pain   Behavior During Therapy [facility] for tasks assessed/performed      Past Medical History  Diagnosis Date  . Rectal cancer ([place]) 2000    At [facility] -stage 3. RT and chemo also  . Hypertension   . Rheumatoid arthritis(714.0)   . Subclinical hypothyroidism 2014  . Atrial fibrillation ([place]) 11/14  . Collagen vascular disease ([place])   . AAA (abdominal aortic aneurysm) ([place]) 12/16    4cm infrarenal  . Pancreatic insufficiency [facility])     Past Surgical History  Procedure Laterality Date  . Transanal rectal resection  2000    [facility]  . Varicose vein surgery  1979  . Colonoscopy    . Right foot surgery Right 2/14    toes pinned  . Colonoscopy with propofol N/A 01/27/2015    Procedure: COLONOSCOPY WITH PROPOFOL;  Surgeon: Josefine Class, MD;  Location: [facility] ENDOSCOPY;  Service: Endoscopy;  Laterality: N/A;    There were no vitals filed for this visit.  Visit Diagnosis:  Poor posture  Muscle spasm  Weakness generalized      Subjective Assessment - 08/22/15 1013    Subjective Pt reported he saw his surgeon and was informed he did not need surgery and that the "lesions" in the colon need to heal. Pt reports bowels are still frequent. Pt's sleep interrupted  5 x within 4 hours .  The urge comes as soon as he lays on his side. Fecal frequency occurs mostly at night.  Pt reports he does not eat enough veggies  nor has kept track of his water intake. Pt staed the toileting technique he learned at last session was helpful.       Patient is accompained by: Family member   Pertinent History  Pt enjoys performing yard work, housework, work on cars. No increased pain with these activities. Hx of RA.     Patient Stated Goals 1) get better             [facility] PT Assessment - 08/22/15 1056    Observation/Other Assessments   Observations sacral sitting present. slightly improved upright posture, cued for alignment for porper sitting   AROM   Overall AROM Comments limited trunk rotation in sidelying (open book exercise modified)                     [facility] Adult PT Treatment/Exercise - 08/22/15 1059    Self-Care   Other Self-Care Comments  fiber and water intake, guided to keep a food diary, explained the role of relxation for healing and decreasing urgency   Neuro Re-ed    Neuro Re-ed Details  body scan and paced breathing    Exercises   Other Exercises  open book    Manual Therapy   Manual therapy comments MWM open book STM  PT Education - 08/22/15 1104    Education provided Yes   Education Details HEP   Person(s) Educated Patient   Methods Demonstration;Explanation;Tactile cues;Verbal cues;Handout   Comprehension Verbalized understanding;Returned demonstration             PT Long Term Goals - 08/16/15 1953    PT LONG TERM GOAL #1   Title Pt will decrease his COREFO score from 28% to < 20% in order to demo improved pelvic floor function.   Time 12   Period Weeks   Status New   PT LONG TERM GOAL #2   Title Pt will demo proper upright sitting posture across 2 visits to decrease pressure on rectum and improve pelvic floor function.    Time 12   Period Weeks   Status New   PT LONG TERM GOAL #3   Title Pt will demo IND with HEP    Time 12   Period Weeks   Status New   PT LONG TERM GOAL #4   Title Pt will demo > 20 sec on SLS bilaterally in order  to decrease risk for falls.   Time 12   Period Weeks   Status New               Plan - 08/22/15 1106    Clinical Impression Statement Pt reported he consulted a surgeon and he was informed that he did not need surgery. Pt showed slight improvement with upright posture and good carry over with diaphragmatic breathing. 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  Pt was educated on pre-bedtime routine to induce relaxation in order to decrease anxiety/ nighttime fecal frequency, and promote healing. Pt was also educated on increasing fiber and water intake in order to improve bowel regularity.  Given his compromised GI system, pt was also educated to use The Mindful Diet book to promote relaxation and optimal absorbency of nutrients. Pt will require skilled PT to address his goals.      Pt will benefit from skilled therapeutic intervention in order to improve on the following deficits Abnormal gait;Pain;Improper body mechanics;Hypermobility;Decreased coordination;Decreased activity tolerance;Decreased endurance;Hypomobility;Decreased range of motion;Decreased balance;Impaired flexibility;Increased muscle spasms;Postural dysfunction;Decreased mobility   Rehab Potential Fair   PT Frequency 1x / week   PT Duration 12 weeks   PT Treatment/Interventions ADLs/Self Care Home Management;Gait training;Fluidtherapy;Aquatic Therapy;Biofeedback;Cryotherapy;Parrafin;Moist Heat;Traction;Stair training;Functional mobility training;Therapeutic activities;Therapeutic exercise;Balance training;Neuromuscular re-education;Manual techniques;Patient/family education;Scar mobilization;Passive range of motion;Energy conservation;Taping   Consulted and Agree with Plan of Care Patient        Problem List Patient Active Problem List   Diagnosis Date Noted  . AAA (abdominal aortic aneurysm) ([place])   . Pancreatic  insufficiency ([place])   . Hypothyroidism   . Frequent stools 04/23/2014  . Atrial fibrillation ([place])   . Routine general medical examination at a health care facility 09/26/2012  . History of rectal cancer 08/29/2011  . Rheumatoid arthritis ([place]) 08/29/2011  . Hypertension 08/29/2011  . History of varicose veins 08/29/2011    Jerl Mina ,PT, DPT, E-RYT  08/22/2015, 11:34 AM  [place] MAIN [facility] SERVICES [address] [place], [place], [address] Phone: [phone]   Fax:  [phone]  Name: Damon Torres MRN: [id number] Date of Birth: June 15, 1940

## 2015-08-22 NOTE — Patient Instructions (Signed)
Complete 3 day of food diary:  Bed time routine:  Open book 15 x pillow between knees and behind back , both sides    Paced breathing to calm the nervous system, inhale 1-2-3- pause, exhale 3-2-1- pause (10 x )    Body scan technique ( [email or web address])

## 2015-08-25 ENCOUNTER — Ambulatory Visit: Payer: Federal, State, Local not specified - PPO | Admitting: Physical Therapy

## 2015-08-25 ENCOUNTER — Encounter: Payer: Self-pay | Admitting: Internal Medicine

## 2015-08-25 DIAGNOSIS — M62838 Other muscle spasm: Secondary | ICD-10-CM

## 2015-08-25 DIAGNOSIS — R293 Abnormal posture: Secondary | ICD-10-CM

## 2015-08-25 DIAGNOSIS — R531 Weakness: Secondary | ICD-10-CM

## 2015-08-25 NOTE — Patient Instructions (Addendum)
Nutrition:  [email or web address]  . Under "Short" column  . Homemade Vegetable Broth . Welcome to My Pantry . Cooking Spices    [email or web address]   [email or web address]   Speak to your MD for additional resources , possible referral for nutritionist   Incorporate steamed vegetables into lunch and dinner, fresh fruits for increased fiber  _____________________  You are now ready to begin training the deep core muscles system: diaphragm, transverse abdominis, pelvic floor . These muscles must work together as a team.           The key to these exercises to train the brain to coordinate the timing of these muscles and to have them turn on for long periods of time to hold you upright against gravity (especially important if you are on your feet all day).These muscles are postural muscles and play a role stabilizing your spine and bodyweight. By doing these repetitions slowly and correctly instead of doing crunches, you will achieve a flatter belly without a lower pooch. You are also placing your spine in a more neutral position and breathing properly which in turn, decreases your risk for problems related to your pelvic floor, abdominal, and low back such as pelvic organ prolapse, hernias, diastasis recti (separation of superficial muscles), disk herniations, spinal fractures. These exercises set a solid foundation for you to later progress to resistance/ strength training with therabands and weights and return to other typical fitness exercises with a stronger deeper core.  Do Level 1-2 (10 x 3 sets. 2 x day)     Yellow band "w"  10 x 3 sets / 2 x day

## 2015-08-26 NOTE — Therapy (Signed)
[place] MAIN [facility] SERVICES [address] [place], [place], [address] Phone: [phone]   Fax:  [phone]  Physical Therapy Treatment  Patient Details  Name: Damon Torres MRN: [id number] Date of Birth: 1940/11/15 Referring Provider: REIN   Encounter Date: 08/25/2015      PT End of Session - 08/26/15 2250    Visit Number 3   Number of Visits 12   Date for PT Re-Evaluation 11/07/15   PT Start Time 1330   PT Stop Time 1430   PT Time Calculation (min) 60 min   Activity Tolerance Patient tolerated treatment well;No increased pain   Behavior During Therapy [facility] for tasks assessed/performed      Past Medical History  Diagnosis Date  . Rectal cancer ([place]) 2000    At [facility] -stage 3. RT and chemo also  . Hypertension   . Rheumatoid arthritis(714.0)   . Subclinical hypothyroidism 2014  . Atrial fibrillation ([place]) 11/14  . Collagen vascular disease ([place])   . AAA (abdominal aortic aneurysm) ([place]) 12/16    4cm infrarenal  . Pancreatic insufficiency [facility])     Past Surgical History  Procedure Laterality Date  . Transanal rectal resection  2000    [facility]  . Varicose vein surgery  1979  . Colonoscopy    . Right foot surgery Right 2/14    toes pinned  . Colonoscopy with propofol N/A 01/27/2015    Procedure: COLONOSCOPY WITH PROPOFOL;  Surgeon: Josefine Class, MD;  Location: [facility] ENDOSCOPY;  Service: Endoscopy;  Laterality: N/A;    There were no vitals filed for this visit.  Visit Diagnosis:  Poor posture  Muscle spasm  Weakness generalized      Subjective Assessment - 08/26/15 2245    Subjective Pt reported he has been trying to gain back his weight because he had lost 30#.  Last night, pt ate pizza buffet in large portions which led to 5 bowel movements that night and additional BMs up until 7 am this morning.  Pt started drinking Slim Fast a few weeks. Pt returned his Food Journal.    Patient is accompained by: Family  member   Pertinent History  Pt enjoys performing yard work, housework, work on cars. No increased pain with these activities. Hx of RA.     Patient Stated Goals 1) get better             [facility] PT Assessment - 08/26/15 2249    Observation/Other Assessments   Observations more upright posture w/ minor cuing                     [facility] Adult PT Treatment/Exercise - 08/26/15 2249    Self-Care   Other Self-Care Comments  see pt instructions   Neuro Re-ed    Neuro Re-ed Details  see pt instructions                PT Education - 08/26/15 2250    Education provided Yes   Education Details HEP   Person(s) Educated Patient   Methods Explanation;Demonstration;Tactile cues;Verbal cues;Handout   Comprehension Returned demonstration;Verbalized understanding             PT Long Term Goals - 08/16/15 1953    PT LONG TERM GOAL #1   Title Pt will decrease his COREFO score from 28% to < 20% in order to demo improved pelvic floor function.   Time 12   Period Weeks   Status New  PT LONG TERM GOAL #2   Title Pt will demo proper upright sitting posture across 2 visits to decrease pressure on rectum and improve pelvic floor function.    Time 12   Period Weeks   Status New   PT LONG TERM GOAL #3   Title Pt will demo IND with HEP    Time 12   Period Weeks   Status New   PT LONG TERM GOAL #4   Title Pt will demo > 20 sec on SLS bilaterally in order to decrease risk for falls.   Time 12   Period Weeks   Status New               Plan - 08/26/15 2250    Clinical Impression Statement Pt showed improved upright posture and was progressed to deep core exercises and posterior back strengthening in supine position today. Pt received nutritional resources as pt would benefit from  consultation with a nutrition to make healthier choices for better digestion given compromised GI system and pt's food diary that showed  poor intake of fiber. Pt was also educated on  body scan technique to induce relaxation to manage anxiety and promote parasympathetic activation.  Pt will continue to benefit from skilled PT.     Pt will benefit from skilled therapeutic intervention in order to improve on the following deficits Abnormal gait;Pain;Improper body mechanics;Hypermobility;Decreased coordination;Decreased activity tolerance;Decreased endurance;Hypomobility;Decreased range of motion;Decreased balance;Impaired flexibility;Increased muscle spasms;Postural dysfunction;Decreased mobility   Rehab Potential Fair   PT Frequency 1x / week   PT Duration 12 weeks   PT Treatment/Interventions ADLs/Self Care Home Management;Gait training;Fluidtherapy;Aquatic Therapy;Biofeedback;Cryotherapy;Parrafin;Moist Heat;Traction;Stair training;Functional mobility training;Therapeutic activities;Therapeutic exercise;Balance training;Neuromuscular re-education;Manual techniques;Patient/family education;Scar mobilization;Passive range of motion;Energy conservation;Taping   Consulted and Agree with Plan of Care Patient        Problem List Patient Active Problem List   Diagnosis Date Noted  . AAA (abdominal aortic aneurysm) ([place])   . Pancreatic insufficiency ([place])   . Hypothyroidism   . Frequent stools 04/23/2014  . Atrial fibrillation ([place])   . Routine general medical examination at a health care facility 09/26/2012  . History of rectal cancer 08/29/2011  . Rheumatoid arthritis ([place]) 08/29/2011  . Hypertension 08/29/2011  . History of varicose veins 08/29/2011    Jerl Mina ,PT, DPT, E-RYT  08/26/2015, 10:56 PM  [place] MAIN [facility] SERVICES [address] [place], [place], [address] Phone: [phone]   Fax:  [phone]  Name: Damon Torres MRN: [id number] Date of Birth: [birth year]

## 2015-08-29 ENCOUNTER — Ambulatory Visit: Payer: Federal, State, Local not specified - PPO | Admitting: Physical Therapy

## 2015-09-05 ENCOUNTER — Ambulatory Visit: Payer: Federal, State, Local not specified - PPO | Attending: Gastroenterology | Admitting: Physical Therapy

## 2015-09-05 DIAGNOSIS — M62838 Other muscle spasm: Secondary | ICD-10-CM | POA: Diagnosis present

## 2015-09-05 DIAGNOSIS — R293 Abnormal posture: Secondary | ICD-10-CM | POA: Diagnosis present

## 2015-09-05 DIAGNOSIS — R531 Weakness: Secondary | ICD-10-CM | POA: Diagnosis present

## 2015-09-05 NOTE — Patient Instructions (Addendum)
Community classess  Tai Chi for balance  Arthritis classes for building muscles    Information for Arthritis Support Group

## 2015-09-06 NOTE — Therapy (Signed)
[place] MAIN [facility] SERVICES [address] [place], [place], [address] Phone: [phone]   Fax:  [phone]  Physical Therapy Treatment /Discharge Summary  Patient Details  Name: Damon Torres MRN: [id number] Date of Birth: [birth year] Referring Provider: Rayann Heman   Encounter Date: 09/05/2015      PT End of Session - 09/05/15 1538    Visit Number 4   Number of Visits 12   Date for PT Re-Evaluation 11/07/15   PT Start Time 5726   PT Stop Time 1546   PT Time Calculation (min) 35 min   Activity Tolerance Patient tolerated treatment well;No increased pain   Behavior During Therapy [facility] for tasks assessed/performed      Past Medical History  Diagnosis Date  . Rectal cancer ([place]) 2000    At [facility] -stage 3. RT and chemo also  . Hypertension   . Rheumatoid arthritis(714.0)   . Subclinical hypothyroidism 2014  . Atrial fibrillation ([place]) 11/14  . Collagen vascular disease ([place])   . AAA (abdominal aortic aneurysm) ([place]) 12/16    4cm infrarenal  . Pancreatic insufficiency [facility])     Past Surgical History  Procedure Laterality Date  . Transanal rectal resection  2000    [facility]  . Varicose vein surgery  1979  . Colonoscopy    . Right foot surgery Right 2/14    toes pinned  . Colonoscopy with propofol N/A 01/27/2015    Procedure: COLONOSCOPY WITH PROPOFOL;  Surgeon: Josefine Class, MD;  Location: [facility] ENDOSCOPY;  Service: Endoscopy;  Laterality: N/A;    There were no vitals filed for this visit.  Visit Diagnosis:  Poor posture  Muscle spasm  Weakness generalized      Subjective Assessment - 09/06/15 2329    Subjective Pt reports his bowel frequency Sx has improved by 30% and his rectal mm spasms have decreased to no mm for the past weak. Pt has a little pain with his bowel movement and feels he has not healed up. Pt has returned to eating what he likes and started incorporated more veggies. Pt reports PT was been  a "life saver" to  him.     Patient is accompained by: Family member   Pertinent History  Pt enjoys performing yard work, housework, work on cars. No increased pain with these activities. Hx of RA.     Patient Stated Goals 1) get better             [facility] PT Assessment - 09/06/15 2329    Observation/Other Assessments   Observations more upright posture in standing, gait, and sitting   Single Leg Stance   Comments 30 sec bilaterally with hand support on wall , unable to perform > 1 sec without UE support                     [facility] Adult PT Treatment/Exercise - 09/05/15 1554    Self-Care   Other Self-Care Comments  reviewed food diary and discussed maintenance post d/c for wellness                 PT Education - 09/05/15 1551    Education provided Yes   Education Details d/c and community classes for wellness   Methods Explanation   Comprehension Verbalized understanding;Returned demonstration;Verbal cues required;Tactile cues required             PT Long Term Goals - 09/05/15 1514    PT LONG TERM GOAL #1  Title Pt will decrease his COREFO score from 28% to < 20% in order to demo improved pelvic floor function. (09/05/15: 21%)    Time 12   Period Weeks   Status Achieved   PT LONG TERM GOAL #2   Title Pt will demo proper upright sitting posture across 2 visits to decrease pressure on rectum and improve pelvic floor function.    Time 12   Period Weeks   Status Achieved   PT LONG TERM GOAL #3   Title Pt will demo IND with HEP    Time 12   Period Weeks   Status Achieved   PT LONG TERM GOAL #4   Title Pt will demo > 20 sec on SLS bilaterally in order to decrease risk for falls. ( 09/05/15:    Time 12   Period Weeks   Status Partially Met               Plan - 09/05/15 1530    Clinical Impression Statement Pt reports since [facility], his Sx has improved a "A Very Great Deal better" according to [facility].Scale. His bowel movements have decreased in frequency with less  diarrhea and less fecal incontinence. Pt is able to sleep through the night without having to get up to the toilet for bowel movements. Pt has made healthier eating habits and maintained his exercises which have contributed to his progress. Pt 's rectal mm spasms have resolved.  Pt has all his goals related to his bowel Sx but has partially met one remaining goal related to balance. Pt was provided information on community fitness classes to maintain LE strength and improve balance.  Pt is ready to be d/c at this time. Thank you for your referral.    Pt will benefit from skilled therapeutic intervention in order to improve on the following deficits Abnormal gait;Pain;Improper body mechanics;Hypermobility;Decreased coordination;Decreased activity tolerance;Decreased endurance;Hypomobility;Decreased range of motion;Decreased balance;Impaired flexibility;Increased muscle spasms;Postural dysfunction;Decreased mobility   Rehab Potential Fair   PT Frequency 1x / week   PT Duration 12 weeks   PT Treatment/Interventions ADLs/Self Care Home Management;Gait training;Fluidtherapy;Aquatic Therapy;Biofeedback;Cryotherapy;Parrafin;Moist Heat;Traction;Stair training;Functional mobility training;Therapeutic activities;Therapeutic exercise;Balance training;Neuromuscular re-education;Manual techniques;Patient/family education;Scar mobilization;Passive range of motion;Energy conservation;Taping   Consulted and Agree with Plan of Care Patient        Problem List Patient Active Problem List   Diagnosis Date Noted  . AAA (abdominal aortic aneurysm) ([place])   . Pancreatic insufficiency ([place])   . Hypothyroidism   . Frequent stools 04/23/2014  . Atrial fibrillation ([place])   . Routine general medical examination at a health care facility 09/26/2012  . History of rectal cancer 08/29/2011  . Rheumatoid arthritis ([place]) 08/29/2011  . Hypertension 08/29/2011  . History of varicose veins 08/29/2011    Jerl Mina ,PT,  DPT, E-RYT  09/06/2015, 11:31 PM  [place] MAIN [facility] SERVICES [address] [place], [place], [address] Phone: [phone]   Fax:  [phone]  Name: MIKHAEL HENDRIKS MRN: [id number] Date of Birth: 22-Jul-1940

## 2015-09-08 ENCOUNTER — Encounter: Payer: Federal, State, Local not specified - PPO | Admitting: Physical Therapy

## 2015-09-12 ENCOUNTER — Encounter: Payer: Federal, State, Local not specified - PPO | Admitting: Physical Therapy

## 2015-09-19 ENCOUNTER — Encounter: Payer: Federal, State, Local not specified - PPO | Admitting: Physical Therapy

## 2015-09-21 ENCOUNTER — Other Ambulatory Visit: Payer: Self-pay | Admitting: Internal Medicine

## 2015-09-26 ENCOUNTER — Encounter: Payer: Federal, State, Local not specified - PPO | Admitting: Physical Therapy

## 2015-09-29 DIAGNOSIS — D7589 Other specified diseases of blood and blood-forming organs: Secondary | ICD-10-CM | POA: Insufficient documentation

## 2015-09-29 DIAGNOSIS — M6289 Other specified disorders of muscle: Secondary | ICD-10-CM | POA: Insufficient documentation

## 2015-09-29 DIAGNOSIS — K8689 Other specified diseases of pancreas: Secondary | ICD-10-CM

## 2015-09-29 HISTORY — DX: Other specified diseases of blood and blood-forming organs: D75.89

## 2015-09-29 HISTORY — DX: Other specified diseases of pancreas: K86.89

## 2015-09-29 HISTORY — DX: Other specified disorders of muscle: M62.89

## 2015-10-03 ENCOUNTER — Encounter: Payer: Federal, State, Local not specified - PPO | Admitting: Physical Therapy

## 2015-10-12 ENCOUNTER — Encounter: Payer: Self-pay | Admitting: Internal Medicine

## 2015-10-12 ENCOUNTER — Ambulatory Visit (INDEPENDENT_AMBULATORY_CARE_PROVIDER_SITE_OTHER): Payer: Federal, State, Local not specified - PPO | Admitting: Internal Medicine

## 2015-10-12 VITALS — BP 136/92 | HR 63 | Temp 98.1°F | Ht 73.75 in | Wt 176.5 lb

## 2015-10-12 DIAGNOSIS — I714 Abdominal aortic aneurysm, without rupture, unspecified: Secondary | ICD-10-CM

## 2015-10-12 DIAGNOSIS — I482 Chronic atrial fibrillation, unspecified: Secondary | ICD-10-CM

## 2015-10-12 DIAGNOSIS — I10 Essential (primary) hypertension: Secondary | ICD-10-CM

## 2015-10-12 DIAGNOSIS — Z0000 Encounter for general adult medical examination without abnormal findings: Secondary | ICD-10-CM | POA: Diagnosis not present

## 2015-10-12 DIAGNOSIS — K8689 Other specified diseases of pancreas: Secondary | ICD-10-CM

## 2015-10-12 DIAGNOSIS — M05741 Rheumatoid arthritis with rheumatoid factor of right hand without organ or systems involvement: Secondary | ICD-10-CM

## 2015-10-12 DIAGNOSIS — M05742 Rheumatoid arthritis with rheumatoid factor of left hand without organ or systems involvement: Secondary | ICD-10-CM

## 2015-10-12 NOTE — Assessment & Plan Note (Signed)
Good control on enbrel Sees Dr Jefm Bryant

## 2015-10-12 NOTE — Assessment & Plan Note (Signed)
4 cm infrarenal Just seen on MRI Will recheck with ultrasound next year

## 2015-10-12 NOTE — Assessment & Plan Note (Signed)
BP Readings from Last 3 Encounters:  10/12/15 136/92  01/27/15 130/91  01/11/15 128/70   Generally controlled No changes for now Reviewed recent labs at [facility]

## 2015-10-12 NOTE — Assessment & Plan Note (Signed)
No PSA due to age Recent colonoscopy Can't get zostavax on enbrel UTD otherwise Discussed staying active in the winter

## 2015-10-12 NOTE — Progress Notes (Signed)
Subjective:    Patient ID: Damon Torres, male    DOB: 01-30-1941, 75 y.o.   MRN: [id number]  HPI Here for physical  Has had a number of problems since my last visit Bowels had gotten worse Saw Dr Rayann Heman at [facility]---colonoscopy was okay (biopsy did show slight colitis) Eventually diagnosed with pancreatic insufficiency. 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 amLODipine-benazepril (LOTREL) 5-10 MG capsule TAKE 1 CAPSULE DAILY 90 capsule 0  . calcium-vitamin D (OSCAL WITH D) 500-200 MG-UNIT per tablet Take 1 tablet by mouth daily with breakfast.    . etanercept (ENBREL) 50 MG/ML injection Inject 50 mg into the skin once a week.    . levothyroxine (SYNTHROID, LEVOTHROID) 75 MCG tablet Take 1 tablet (75 mcg total) by mouth daily before breakfast. 90 tablet 3  . metoprolol succinate (TOPROL-XL) 25 MG 24 hr tablet Take 25 mg by mouth daily.     . Omega-3 Fatty Acids (FISH OIL) 1000 MG CAPS Take 1 capsule by mouth daily.    . rivaroxaban (XARELTO) 20 MG TABS tablet Take 20 mg by mouth daily with supper.     No current facility-administered medications on file prior to visit.    No Known Allergies  Past Medical History  Diagnosis Date  . Rectal cancer ([place]) 2000    At [facility] -stage 3. RT and chemo also  . Hypertension   . Rheumatoid arthritis(714.0)   . Subclinical hypothyroidism 2014  . Atrial fibrillation ([place]) 11/14  . Collagen vascular disease ([place])   . 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 Pancreatic insufficiency [facility])     Past Surgical History  Procedure Laterality Date  . Transanal  [facility]al [facility]  [facility]  . Varicose vein surgery  1979  . Colonoscopy    . Right foot surgery Right 2/14    toes pinned  . Colonoscopy with propofol N/A 01/27/2015    Procedure: COLONOSCOPY WITH PROPOFOL;  Surgeon: Josefine Class, MD;  Location: [facility] ENDOSCOPY;  Service: Endoscopy;  Laterality: N/A;    Family History  Problem Relation Age of Onset  . Liver cancer Mother   . Breast cancer Mother   . Heart disease Mother   . Colon cancer Neg Hx   . Esophageal cancer Neg Hx   . Rectal cancer Neg Hx   . Stomach cancer Neg Hx   . Diabetes Neg Hx   . Heart disease Brother   . Hypertension Brother     Social History   Social History  . Marital Status: Married    Spouse Name: N/A  . Number of Children: 2  . Years of Education: N/A   Occupational History  . 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 Smoking status: Former Research scientist (life sciences)  . Smokeless tobacco: Never Used  . Alcohol Use: No  . Drug Use: No  . Sexual Activity: Not on file   Other Topics Concern  . Not on file   Social History  Narrative   No living will   Would want wife to make health care decisions   Would accept resuscitation attempts   Would not want tube feeds if cognitively unaware   Review of Systems  Constitutional: Negative for fatigue.       Had lost weight--but coming back up again Wears seat belt  HENT: Negative for dental problem, hearing loss and tinnitus.        Keeps up with dentist  Eyes: Negative for visual disturbance.       No diplopia or unilateral vision loss  Respiratory: Negative for cough, chest tightness and shortness of breath.   Cardiovascular: Negative for chest pain, palpitations and leg swelling.  Gastrointestinal: Negative for nausea, vomiting and abdominal pain.       No heartburn  Endocrine: Negative for polydipsia and polyuria.  Genitourinary: Negative for urgency and difficulty urinating.        No sexual problems  Musculoskeletal: Negative for back pain, joint swelling and arthralgias.  Skin: Negative for rash.       No suspicious lesions  Allergic/Immunologic: Negative for environmental allergies and immunocompromised state.  Neurological: Negative for dizziness, syncope, weakness, light-headedness and headaches.  Hematological: Negative for adenopathy. Bruises/bleeds easily.  Psychiatric/Behavioral: Negative for sleep disturbance and dysphoric mood. The patient is not nervous/anxious.        Objective:   Physical Exam  Constitutional: He is oriented to person, place, and time. He appears well-developed and well-nourished. No distress.  HENT:  Head: Normocephalic and atraumatic.  Right Ear: External ear normal.  Left Ear: External ear normal.  Mouth/Throat: Oropharynx is clear and moist. No oropharyngeal exudate.  Eyes: Conjunctivae are normal. Pupils are equal, round, and reactive to light.  Neck: Normal range of motion. Neck supple. No thyromegaly present.  Cardiovascular: Normal rate, normal heart sounds and intact distal pulses.  Exam reveals no gallop.   No murmur heard. irregular  Pulmonary/Chest: Effort normal and breath sounds normal. No respiratory distress. He has no wheezes. He has no rales.  Abdominal: Soft. There is no tenderness.  Musculoskeletal: He exhibits no edema or tenderness.  No joint swelling  Lymphadenopathy:    He has no cervical adenopathy.  Neurological: He is alert and oriented to person, place, and time.  Skin: No rash noted. No erythema.  Psychiatric: He has a normal mood and affect. His behavior is normal.          Assessment & Plan:

## 2015-10-12 NOTE — Progress Notes (Signed)
Pre visit review using our clinic review tool, if applicable. No additional management support is needed unless otherwise documented below in the visit note. 

## 2015-10-12 NOTE — Assessment & Plan Note (Signed)
Rate is controlled On xarelto 

## 2015-10-12 NOTE — Assessment & Plan Note (Signed)
Also sphincter dysynergy---all this is better lately

## 2015-10-23 ENCOUNTER — Other Ambulatory Visit: Payer: Self-pay | Admitting: Internal Medicine

## 2016-02-14 ENCOUNTER — Encounter: Payer: Self-pay | Admitting: *Deleted

## 2016-02-14 ENCOUNTER — Encounter
Admission: RE | Disposition: A | Discharge status: Home / Self Care | Payer: Self-pay | Source: Ambulatory Visit | Attending: Ophthalmology

## 2016-02-14 ENCOUNTER — Ambulatory Visit: Payer: Federal, State, Local not specified - PPO | Admitting: Anesthesiology

## 2016-02-14 ENCOUNTER — Ambulatory Visit
Admission: RE | Admit: 2016-02-14 | Discharge: 2016-02-14 | Disposition: A | Discharge status: Home / Self Care | Payer: Federal, State, Local not specified - PPO | Source: Ambulatory Visit | Attending: Ophthalmology | Admitting: Ophthalmology

## 2016-02-14 DIAGNOSIS — I10 Essential (primary) hypertension: Secondary | ICD-10-CM | POA: Insufficient documentation

## 2016-02-14 DIAGNOSIS — Z79899 Other long term (current) drug therapy: Secondary | ICD-10-CM | POA: Insufficient documentation

## 2016-02-14 DIAGNOSIS — Z85048 Personal history of other malignant neoplasm of rectum, rectosigmoid junction, and anus: Secondary | ICD-10-CM | POA: Diagnosis not present

## 2016-02-14 DIAGNOSIS — M069 Rheumatoid arthritis, unspecified: Secondary | ICD-10-CM | POA: Insufficient documentation

## 2016-02-14 DIAGNOSIS — H2511 Age-related nuclear cataract, right eye: Secondary | ICD-10-CM | POA: Diagnosis not present

## 2016-02-14 DIAGNOSIS — I4891 Unspecified atrial fibrillation: Secondary | ICD-10-CM | POA: Diagnosis not present

## 2016-02-14 DIAGNOSIS — Z87891 Personal history of nicotine dependence: Secondary | ICD-10-CM | POA: Diagnosis not present

## 2016-02-14 DIAGNOSIS — E039 Hypothyroidism, unspecified: Secondary | ICD-10-CM | POA: Insufficient documentation

## 2016-02-14 DIAGNOSIS — Z7901 Long term (current) use of anticoagulants: Secondary | ICD-10-CM | POA: Insufficient documentation

## 2016-02-14 HISTORY — PX: CATARACT EXTRACTION W/PHACO: SHX586

## 2016-02-14 SURGERY — PHACOEMULSIFICATION, CATARACT, WITH IOL INSERTION
Anesthesia: Monitor Anesthesia Care | Site: Eye | Laterality: Right | Wound class: Clean

## 2016-02-14 MED ORDER — ARMC OPHTHALMIC DILATING GEL
OPHTHALMIC | Status: AC
  Administered 2016-02-14: 1 via OPHTHALMIC
  Filled 2016-02-14: qty 0.25

## 2016-02-14 MED ORDER — EPINEPHRINE HCL 1 MG/ML IJ SOLN
INTRAMUSCULAR | Status: AC
  Filled 2016-02-14: qty 2

## 2016-02-14 MED ORDER — NA CHONDROIT SULF-NA HYALURON 40-17 MG/ML IO SOLN
INTRAOCULAR | Status: DC | PRN
  Administered 2016-02-14: 1 mL via INTRAOCULAR

## 2016-02-14 MED ORDER — NA CHONDROIT SULF-NA HYALURON 40-17 MG/ML IO SOLN
INTRAOCULAR | Status: AC
  Filled 2016-02-14: qty 1

## 2016-02-14 MED ORDER — MIDAZOLAM HCL 2 MG/2ML IJ SOLN
INTRAMUSCULAR | Status: DC | PRN
  Administered 2016-02-14 (×2): 0.5 mg via INTRAVENOUS

## 2016-02-14 MED ORDER — MOXIFLOXACIN HCL 0.5 % OP SOLN: 1.0000 [drp] | OPHTHALMIC | Status: DC | PRN

## 2016-02-14 MED ORDER — MOXIFLOXACIN HCL 0.5 % OP SOLN
OPHTHALMIC | Status: AC
  Filled 2016-02-14: qty 3

## 2016-02-14 MED ORDER — ARMC OPHTHALMIC DILATING GEL
1.0000 "application " | OPHTHALMIC | Status: AC
  Administered 2016-02-14: 1 via OPHTHALMIC

## 2016-02-14 MED ORDER — TETRACAINE HCL 0.5 % OP SOLN
OPHTHALMIC | Status: AC
  Filled 2016-02-14: qty 2

## 2016-02-14 MED ORDER — CEFUROXIME OPHTHALMIC INJECTION 1 MG/0.1 ML
INJECTION | OPHTHALMIC | Status: DC | PRN
  Administered 2016-02-14: .1 mL via INTRACAMERAL

## 2016-02-14 MED ORDER — CEFUROXIME OPHTHALMIC INJECTION 1 MG/0.1 ML
INJECTION | OPHTHALMIC | Status: AC
  Filled 2016-02-14: qty 0.2

## 2016-02-14 MED ORDER — CARBACHOL 0.01 % IO SOLN
INTRAOCULAR | Status: DC | PRN
  Administered 2016-02-14: .5 mL via INTRAOCULAR

## 2016-02-14 MED ORDER — SODIUM CHLORIDE 0.9 % IV SOLN
INTRAVENOUS | Status: DC
  Administered 2016-02-14: 12:00:00 via INTRAVENOUS

## 2016-02-14 MED ORDER — POVIDONE-IODINE 5 % OP SOLN
1.0000 "application " | OPHTHALMIC | Status: DC | PRN
  Administered 2016-02-14: 1 via OPHTHALMIC

## 2016-02-14 MED ORDER — POVIDONE-IODINE 5 % OP SOLN
OPHTHALMIC | Status: AC
  Filled 2016-02-14: qty 30

## 2016-02-14 MED ORDER — ALFENTANIL 500 MCG/ML IJ INJ
INJECTION | INTRAMUSCULAR | Status: DC | PRN
  Administered 2016-02-14: 250 ug via INTRAVENOUS

## 2016-02-14 MED ORDER — MOXIFLOXACIN HCL 0.5 % OP SOLN
OPHTHALMIC | Status: DC | PRN
  Administered 2016-02-14: 1 [drp] via OPHTHALMIC

## 2016-02-14 MED ORDER — LIDOCAINE HCL (PF) 4 % IJ SOLN
INTRAOCULAR | Status: DC | PRN
  Administered 2016-02-14: .5 mL via OPHTHALMIC

## 2016-02-14 MED ORDER — TETRACAINE HCL 0.5 % OP SOLN
1.0000 [drp] | OPHTHALMIC | Status: DC | PRN
  Administered 2016-02-14: 1 [drp] via OPHTHALMIC

## 2016-02-14 MED ORDER — EPINEPHRINE HCL 1 MG/ML IJ SOLN
INTRAMUSCULAR | Status: DC | PRN
  Administered 2016-02-14: 1 mL via OPHTHALMIC

## 2016-02-14 MED ORDER — LIDOCAINE HCL (PF) 4 % IJ SOLN
INTRAMUSCULAR | Status: AC
  Filled 2016-02-14: qty 5

## 2016-02-14 MED ORDER — LIDOCAINE HCL (PF) 1 % IJ SOLN
INTRAMUSCULAR | Status: AC
  Filled 2016-02-14: qty 2

## 2016-02-14 SURGICAL SUPPLY — 21 items
CANNULA ANT/CHMB 27GA (MISCELLANEOUS) ×2 IMPLANT
CUP MEDICINE 2OZ PLAST GRAD ST (MISCELLANEOUS) ×2 IMPLANT
GLOVE BIO SURGEON STRL SZ8 (GLOVE) ×2 IMPLANT
GLOVE BIOGEL M 6.5 STRL (GLOVE) ×2 IMPLANT
GLOVE SURG LX 8.0 MICRO (GLOVE) ×1
GLOVE SURG LX STRL 8.0 MICRO (GLOVE) ×1 IMPLANT
GOWN STRL REUS W/ TWL LRG LVL3 (GOWN DISPOSABLE) ×2 IMPLANT
GOWN STRL REUS W/TWL LRG LVL3 (GOWN DISPOSABLE) ×2
LENS IOL TECNIS ITEC 18.0 (Intraocular Lens) ×2 IMPLANT
PACK CATARACT (MISCELLANEOUS) ×2 IMPLANT
PACK CATARACT BRASINGTON LX (MISCELLANEOUS) ×2 IMPLANT
PACK EYE AFTER SURG (MISCELLANEOUS) ×2 IMPLANT
SOL BSS BAG (MISCELLANEOUS) ×2
SOL PREP PVP 2OZ (MISCELLANEOUS) ×2
SOLUTION BSS BAG (MISCELLANEOUS) ×1 IMPLANT
SOLUTION PREP PVP 2OZ (MISCELLANEOUS) ×1 IMPLANT
SYR 3ML LL SCALE MARK (SYRINGE) ×2 IMPLANT
SYR 5ML LL (SYRINGE) ×2 IMPLANT
SYR TB 1ML 27GX1/2 LL (SYRINGE) ×2 IMPLANT
WATER STERILE IRR 250ML POUR (IV SOLUTION) ×2 IMPLANT
WIPE NON LINTING 3.25X3.25 (MISCELLANEOUS) ×2 IMPLANT

## 2016-02-14 NOTE — Anesthesia Preprocedure Evaluation (Signed)
Anesthesia Evaluation  Patient identified by MRN, date of birth, ID band Patient awake    Reviewed: Allergy & Precautions, NPO status , Patient's Chart, lab work & pertinent test results, reviewed documented beta blocker date and time   History of Anesthesia Complications Negative for: history of anesthetic complications  Airway Mallampati: III  TM Distance: >3 FB Neck ROM: limited    Dental  (+) Partial Lower, Upper Dentures, Chipped, Poor Dentition, Missing   Pulmonary former smoker,    Pulmonary exam normal breath sounds clear to auscultation       Cardiovascular hypertension, Pt. on medications and Pt. on home beta blockers + Peripheral Vascular Disease  Normal cardiovascular exam Rhythm:regular Rate:Normal     Neuro/Psych    GI/Hepatic   Endo/Other  Hypothyroidism   Renal/GU      Musculoskeletal  (+) Arthritis , Rheumatoid disorders,    Abdominal   Peds  Hematology   Anesthesia Other Findings Past Medical History: 12/16: AAA (abdominal aortic aneurysm) ([facility])     Comment: 4cm infrarenal 11/14: Atrial fibrillation ([facility]) No date: Collagen vascular disease ([place]) No date: Hypertension No date: Pancreatic insufficiency ([place]) 2000: Rectal cancer ([place])     Comment: At [facility] -stage 3. RT and chemo also No date: Rheumatoid arthritis(714.0) 2014: Subclinical hypothyroidism   Reproductive/Obstetrics                             Anesthesia Physical  Anesthesia Plan  ASA: III  Anesthesia Plan: MAC   Post-op Pain Management:    Induction:   Airway Management Planned: Nasal Cannula  Additional Equipment:   Intra-op Plan:   Post-operative Plan:   Informed Consent: I have reviewed the patients History and Physical, chart, labs and discussed the procedure including the risks, benefits and alternatives for the proposed anesthesia with the patient or authorized representative who has  indicated his/her understanding and acceptance.     Plan Discussed with: CRNA, Surgeon and Anesthesiologist  Anesthesia Plan Comments:         Anesthesia Quick Evaluation

## 2016-02-14 NOTE — Discharge Instructions (Signed)
Eye Surgery Discharge Instructions  Expect mild scratchy sensation or mild soreness. DO NOT RUB YOUR EYE!  The day of surgery:  Minimal physical activity, but bed rest is not required  No reading, computer work, or close hand work  No bending, lifting, or straining.  May watch TV  For 24 hours:  No driving, legal decisions, or alcoholic beverages  Safety precautions  Eat anything you prefer: It is better to start with liquids, then soup then solid foods.  _____ Eye patch should be worn until postoperative exam tomorrow.  ____ Solar shield eyeglasses should be worn for comfort in the sunlight/patch while sleeping  Resume all regular medications including aspirin or Coumadin if these were discontinued prior to surgery. You may shower, bathe, shave, or wash your hair. Tylenol may be taken for mild discomfort.  Call your doctor if you experience significant pain, nausea, or vomiting, fever > 101 or other signs of infection. [phone] or [phone] Specific instructions:  Follow-up Information    Tim Lair, MD. Go on 02/15/2016.   Specialty:  Ophthalmology Why:  Appointment time is set for 09:35 am (TOMORROW) Contact information: [place] [address] [phone]

## 2016-02-14 NOTE — Transfer of Care (Signed)
Immediate Anesthesia Transfer of Care Note  Patient: Damon Torres  Procedure(s) Performed: Procedure(s) with comments: CATARACT EXTRACTION PHACO AND INTRAOCULAR LENS PLACEMENT (IOC) (Right) - [place] 01:02 AP% 18.7 CDE 11.59 Fluid pack lot # [id number]  Patient Location: PACU  Anesthesia Type:MAC  Level of Consciousness: awake, alert  and oriented  Airway & Oxygen Therapy: Patient Spontanous Breathing  Post-op Assessment: Report given to RN and Post -op Vital signs reviewed and stable  Post vital signs: Reviewed and stable  Last Vitals:  Vitals:   02/14/16 1105  BP: (!) 146/83  Pulse: 64  Resp: 16  Temp: 36.6 C    Last Pain:  Vitals:   02/14/16 1105  TempSrc: Oral         Complications: No apparent anesthesia complications

## 2016-02-14 NOTE — Op Note (Signed)
PREOPERATIVE DIAGNOSIS:  Nuclear sclerotic cataract of the right eye.   POSTOPERATIVE DIAGNOSIS: nuclear sclerotic cataract right eye   OPERATIVE PROCEDURE:  Procedure(s): CATARACT EXTRACTION PHACO AND INTRAOCULAR LENS PLACEMENT (IOC)   SURGEON:  Birder Robson, MD.   ANESTHESIA:  Anesthesiologist: Andria Frames, MD CRNA: Johnna Acosta, CRNA  1.      Managed anesthesia care. 2.      Topical tetracaine drops followed by 2% Xylocaine jelly applied in the preoperative holding area.    3.  0.2 ml of epi-Shugarcaine was  placed in the anterior chamber following the paracentesis.    COMPLICATIONS:  None.   TECHNIQUE:   Stop and chop   DESCRIPTION OF PROCEDURE:  The patient was examined and consented in the preoperative holding area where the aforementioned topical anesthesia was applied to the right eye and then brought back to the Operating Room where the right eye was prepped and draped in the usual sterile ophthalmic fashion and a lid speculum was placed. A paracentesis was created with the side port blade and the anterior chamber was filled with viscoelastic. A near clear corneal incision was performed with the steel keratome. A continuous curvilinear capsulorrhexis was performed with a cystotome followed by the capsulorrhexis forceps. Hydrodissection and hydrodelineation were carried out with BSS on a blunt cannula. The lens was removed in a stop and chop  technique and the remaining cortical material was removed with the irrigation-aspiration handpiece. The capsular bag was inflated with viscoelastic and the Technis [id number]  lens was placed in the capsular bag without complication. The remaining viscoelastic was removed from the eye with the irrigation-aspiration handpiece. The wounds were hydrated. The anterior chamber was flushed with Miostat and the eye was inflated to physiologic pressure. 0.1 mL of cefuroxime concentration 10 mg/mL was placed in the anterior chamber. The wounds  were found to be water tight. The eye was dressed with Vigamox. The patient was given protective glasses to wear throughout the day and a shield with which to sleep tonight. The patient was also given drops with which to begin a drop regimen today and will follow-up with me in one day.  Implant Name Type Inv. Item Serial No. Manufacturer Lot No. LRB No. Used  LENS IOL DIOP 18.0 - [id number]   Intraocular Lens LENS IOL DIOP 18.0 [id number]   AMO   Right 1   Procedure(s) with comments: CATARACT EXTRACTION PHACO AND INTRAOCULAR LENS PLACEMENT (IOC) (Right) - [place] 01:02 AP% 18.7 CDE 11.59 Fluid pack lot # [id number]  Electronically signed: Tim Lair 02/14/2016 12:38 PM

## 2016-02-14 NOTE — Anesthesia Postprocedure Evaluation (Signed)
Anesthesia Post Note  Patient: Damon Torres  Procedure(s) Performed: Procedure(s) (LRB): CATARACT EXTRACTION PHACO AND INTRAOCULAR LENS PLACEMENT (IOC) (Right)  Patient location during evaluation: PACU Anesthesia Type: MAC Level of consciousness: awake and alert Pain management: pain level controlled Vital Signs Assessment: post-procedure vital signs reviewed and stable Respiratory status: spontaneous breathing, nonlabored ventilation, respiratory function stable and patient connected to nasal cannula oxygen Cardiovascular status: stable and blood pressure returned to baseline Anesthetic complications: no    Last Vitals:  Vitals:   02/14/16 1242 02/14/16 1256  BP: (!) 141/74 140/78  Pulse:    Resp: 16 16  Temp: 36.6 C     Last Pain:  Vitals:   02/14/16 1242  TempSrc: Oral                 Precious Haws Piscitello

## 2016-02-14 NOTE — H&P (Signed)
  All labs reviewed. Abnormal studies sent to patients PCP when indicated.  Previous H&P reviewed, patient examined, there are NO CHANGES.  Damon Saephan LOUIS9/12/201712:12 PM

## 2016-02-14 NOTE — Anesthesia Procedure Notes (Signed)
Procedure Name: MAC Date/Time: 02/14/2016 12:18 PM Performed by: Johnna Acosta Pre-anesthesia Checklist: Patient identified, Emergency Drugs available, Suction available, Patient being monitored and Timeout performed Patient Re-evaluated:Patient Re-evaluated prior to inductionOxygen Delivery Method: Nasal cannula

## 2016-03-28 DIAGNOSIS — K3189 Other diseases of stomach and duodenum: Secondary | ICD-10-CM

## 2016-03-28 HISTORY — DX: Other diseases of stomach and duodenum: K31.89

## 2016-04-01 DIAGNOSIS — K52832 Lymphocytic colitis: Secondary | ICD-10-CM

## 2016-04-01 HISTORY — DX: Lymphocytic colitis: K52.832

## 2016-10-12 ENCOUNTER — Ambulatory Visit (INDEPENDENT_AMBULATORY_CARE_PROVIDER_SITE_OTHER): Payer: Federal, State, Local not specified - PPO | Admitting: Internal Medicine

## 2016-10-12 ENCOUNTER — Encounter: Payer: Self-pay | Admitting: Internal Medicine

## 2016-10-12 VITALS — BP 134/80 | HR 47 | Temp 97.9°F | Ht 73.5 in | Wt 179.0 lb

## 2016-10-12 DIAGNOSIS — M05741 Rheumatoid arthritis with rheumatoid factor of right hand without organ or systems involvement: Secondary | ICD-10-CM

## 2016-10-12 DIAGNOSIS — I482 Chronic atrial fibrillation: Secondary | ICD-10-CM | POA: Diagnosis not present

## 2016-10-12 DIAGNOSIS — E039 Hypothyroidism, unspecified: Secondary | ICD-10-CM | POA: Diagnosis not present

## 2016-10-12 DIAGNOSIS — I714 Abdominal aortic aneurysm, without rupture, unspecified: Secondary | ICD-10-CM

## 2016-10-12 DIAGNOSIS — M05742 Rheumatoid arthritis with rheumatoid factor of left hand without organ or systems involvement: Secondary | ICD-10-CM | POA: Diagnosis not present

## 2016-10-12 DIAGNOSIS — I10 Essential (primary) hypertension: Secondary | ICD-10-CM

## 2016-10-12 DIAGNOSIS — Z0000 Encounter for general adult medical examination without abnormal findings: Secondary | ICD-10-CM | POA: Diagnosis not present

## 2016-10-12 DIAGNOSIS — I4821 Permanent atrial fibrillation: Secondary | ICD-10-CM

## 2016-10-12 NOTE — Assessment & Plan Note (Signed)
Healthy Colon fine in 2016 No PSA due to age Would give shingrix if off the enbrel

## 2016-10-12 NOTE — Progress Notes (Signed)
Subjective:    Patient ID: Damon Torres, male    DOB: 11-Feb-1941, 76 y.o.   MRN: [id number]  HPI Here for physical  Multiple visits for pancreatic insufficiency Has to use the enzymes for this  Noted the AAA found on MRI in 2016 Hasn't had follow up yet  Continues on enbrel for RA From Dr Jefm Bryant at [facility] to have reasonable control  Energy levels are okay Thinks the thyroid is okay  Current Outpatient Prescriptions on File Prior to Visit  Medication Sig Dispense Refill  . amLODipine-benazepril (LOTREL) 5-10 MG capsule TAKE 1 CAPSULE DAILY 90 capsule 3  . calcium-vitamin D (OSCAL WITH D) 500-200 MG-UNIT per tablet Take 1 tablet by mouth daily with breakfast.    . etanercept (ENBREL) 50 MG/ML injection Inject 50 mg into the skin once a week.    . levothyroxine (SYNTHROID, LEVOTHROID) 75 MCG tablet Take 1 tablet (75 mcg total) by mouth daily before breakfast. 90 tablet 3  . metoprolol succinate (TOPROL-XL) 25 MG 24 hr tablet Take 25 mg by mouth daily.     . Omega-3 Fatty Acids (FISH OIL) 1000 MG CAPS Take 1 capsule by mouth daily.    . Pancrelipase, Lip-Prot-Amyl, (ZENPEP) 40000 units CPEP Take 4 capsules by mouth 3 (three) times daily before meals. Take 4 capsules by mouth 3 times a day before meals. Take up to 2 before all snacks    . rivaroxaban (XARELTO) 20 MG TABS tablet Take 20 mg by mouth daily with supper.     No current facility-administered medications on file prior to visit.     No Known Allergies  Past Medical History:  Diagnosis Date  . 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 Atrial fibrillation ([place]) 11/14  . Collagen vascular disease ([place])   . Hypertension   . Pancreatic insufficiency   . Rectal cancer ([place]) 2000   At [facility] -stage 3. RT and chemo also  . Rheumatoid arthritis(714.0)   . Subclinical hypothyroidism 2014    Past Surgical History:  Procedure Laterality Date  . CATARACT EXTRACTION W/PHACO Right 02/14/2016   Procedure: CATARACT EXTRACTION PHACO AND INTRAOCULAR LENS PLACEMENT (IOC);  Surgeon: Birder Robson, MD;  Location: [facility] ORS;  Service: Ophthalmology;  Laterality: Right;  [place] 01:02 AP% 18.7 CDE 11.59 Fluid pack lot # [id number]  . COLONOSCOPY    . COLONOSCOPY WITH PROPOFOL N/A 01/27/2015   Procedure: COLONOSCOPY WITH PROPOFOL;  Surgeon: Josefine Class, MD;  Location: [facility] ENDOSCOPY;  Service: Endoscopy;  Laterality: N/A;  . Right foot surgery Right 2/14   toes pinned  . TRANSANAL RECTAL RESECTION  2000   [facility]  . VARICOSE VEIN SURGERY  1979    Family History  Problem Relation Age of Onset  . Liver cancer Mother   . Breast cancer Mother   . Heart disease Mother   . Heart disease Brother   . Hypertension Brother   . Colon cancer Neg Hx   . Esophageal cancer Neg Hx   . Rectal cancer Neg Hx   . Stomach cancer Neg Hx   . Diabetes Neg Hx     Social History   Social History  . Marital status: Married    Spouse name: N/A  . Number of children: 2  . Years of education: N/A   Occupational History  . 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  Smoking status: Former Research scientist (life sciences)  . Smokeless tobacco: Never Used  . Alcohol use No  . Drug use: No  . Sexual activity: Not on file   Other Topics Concern  . Not on file   Social History Narrative   Now has living will   Wife is health care POA   Would accept resuscitation attempts   Would not want tube feeds if cognitively unaware   Review of Systems  Constitutional: Negative for unexpected weight change.       Wears seat belt  HENT: Negative for dental problem, hearing loss and tinnitus.        Keeps up with dentist  Eyes: Negative for visual disturbance.       No diplopia or unilateral vision loss Cataract done 9/17  Respiratory: Negative for cough, chest tightness and shortness of breath.   Cardiovascular: Negative for chest pain, palpitations and leg  swelling.  Gastrointestinal: Negative for abdominal pain and blood in stool.       Will have too many BMs sometimes  Endocrine: Negative for polydipsia and polyuria.  Genitourinary: Negative for difficulty urinating and urgency.  Musculoskeletal: Negative for arthralgias, back pain and joint swelling.  Skin: Negative for rash.  Allergic/Immunologic: Negative for environmental allergies and immunocompromised state.  Neurological: Negative for dizziness, syncope, light-headedness and headaches.  Hematological: Negative for adenopathy. Bruises/bleeds easily.  Psychiatric/Behavioral: Negative for dysphoric mood and sleep disturbance. The patient is not nervous/anxious.        Objective:   Physical Exam  Constitutional: He is oriented to person, place, and time. He appears well-developed and well-nourished. No distress.  HENT:  Head: Normocephalic and atraumatic.  Right Ear: External ear normal.  Left Ear: External ear normal.  Mouth/Throat: Oropharynx is clear and moist. No oropharyngeal exudate.  Eyes: Conjunctivae are normal. Pupils are equal, round, and reactive to light.  Neck: Normal range of motion. Neck supple. No thyromegaly present.  Cardiovascular: Normal rate, normal heart sounds and intact distal pulses.  Exam reveals no gallop.   No murmur heard. irregular  Pulmonary/Chest: Effort normal and breath sounds normal. No respiratory distress. He has no wheezes. He has no rales.  Abdominal: Soft. There is no tenderness.  Musculoskeletal: He exhibits no tenderness.  1+ ankle edema  Lymphadenopathy:    He has no cervical adenopathy.  Neurological: He is alert and oriented to person, place, and time.  Skin: No rash noted. No erythema.  Psychiatric: He has a normal mood and affect. His behavior is normal.          Assessment & Plan:

## 2016-10-12 NOTE — Assessment & Plan Note (Signed)
Will ask Dr Raliegh Ip to add thyroid test to blood work

## 2016-10-12 NOTE — Assessment & Plan Note (Signed)
Rate is good On xarelto Sees cardiologist at [facility]

## 2016-10-12 NOTE — Assessment & Plan Note (Signed)
Due for follow up--will do ultrasound

## 2016-10-12 NOTE — Assessment & Plan Note (Signed)
BP Readings from Last 3 Encounters:  10/12/16 134/80  02/14/16 140/78  10/12/15 (!) 136/92   Good control Labs by Dr Raliegh Ip

## 2016-10-12 NOTE — Assessment & Plan Note (Signed)
Controlled with the enbrel

## 2016-10-18 ENCOUNTER — Encounter: Payer: Self-pay | Admitting: Internal Medicine

## 2016-10-18 MED ORDER — LEVOTHYROXINE SODIUM 75 MCG PO TABS: 75.0000 ug | ORAL_TABLET | Freq: Every day | ORAL | 3 refills | Status: DC

## 2016-11-14 ENCOUNTER — Other Ambulatory Visit: Payer: Federal, State, Local not specified - PPO

## 2016-12-06 ENCOUNTER — Ambulatory Visit: Payer: Federal, State, Local not specified - PPO

## 2016-12-06 DIAGNOSIS — I714 Abdominal aortic aneurysm, without rupture, unspecified: Secondary | ICD-10-CM

## 2016-12-26 IMAGING — MR MR ABDOMEN WO/W CM
10 of 18 series · 26 of 48 positions shown · IV contrast (16 ML MULTIHANCE)
Comparison: 02/09/2015 CT abdomen/ pelvis.

CLINICAL DATA: Pancreatic insufficiency. No acute abdominal
symptoms are reported. Remote history of resected rectal cancer in
9666.

EXAM:
MRI ABDOMEN WITHOUT AND WITH CONTRAST
TECHNIQUE: Multiplanar multisequence MR imaging of the abdomen was performed
both before and after the administration of intravenous contrast.
CONTRAST:  16mL MULTIHANCE GADOBENATE DIMEGLUMINE 529 MG/ML IV SOLN

[Series 6: T2 · coronal · 8.0mm · 1.64mm/px · 2 of 27 slices shown (1 of 2)]
[im 1/27]
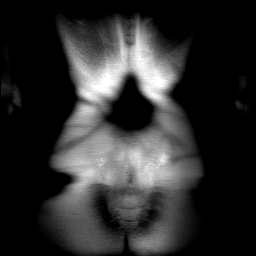
[im 27/27]
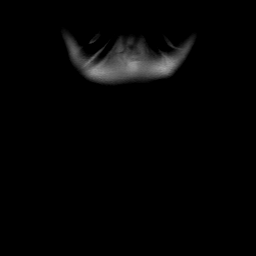

[Series 7: T2 fat-sat · axial · 7.0mm · 0.74mm/px · z∈[-92,+168]mm · 3 of 32 slices shown]
[im 1/32]
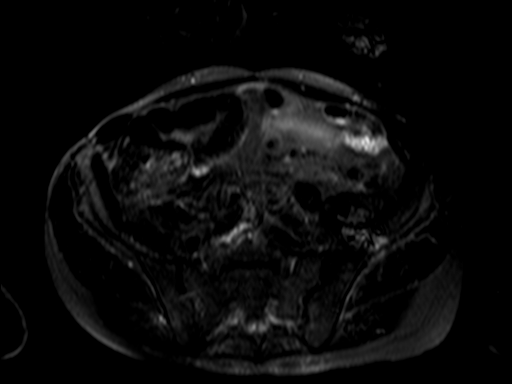
[im 16/32]
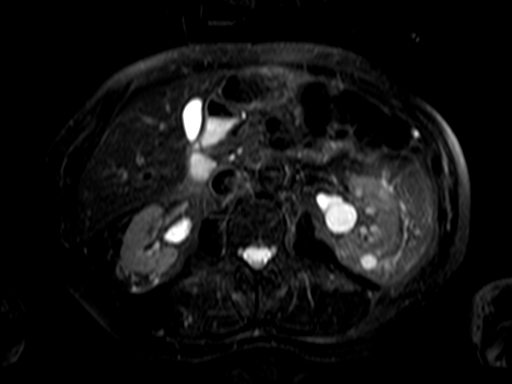
[im 32/32]
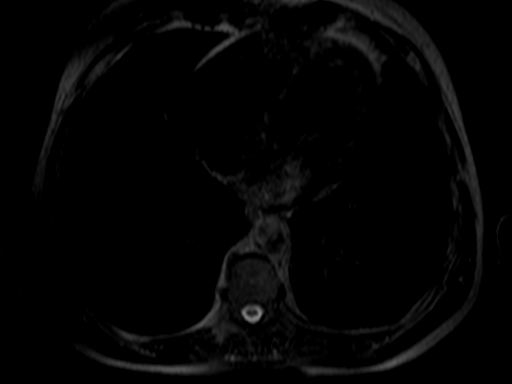

[Series 9: out of phase · axial · 7.0mm · 0.74mm/px · z∈[-92,+168]mm · 2 of 32 slices shown]
[im 1/32]
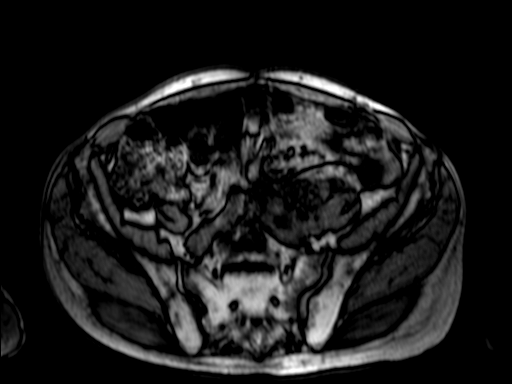
[im 32/32]
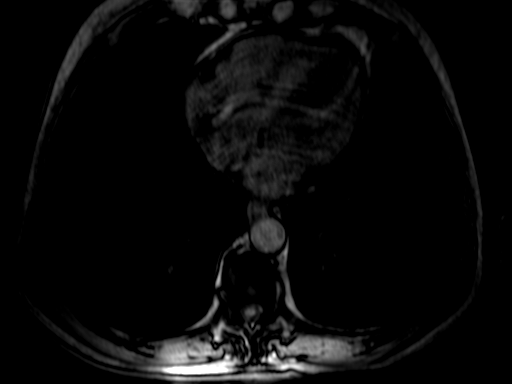

[Series 10: in phase · axial · 7.0mm · 0.74mm/px · z∈[-92,+168]mm · 2 of 32 slices shown]
[im 1/32]
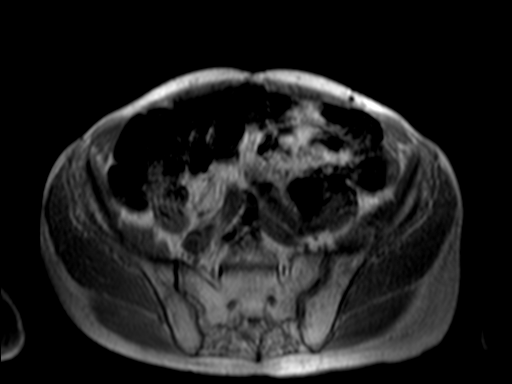
[im 32/32]
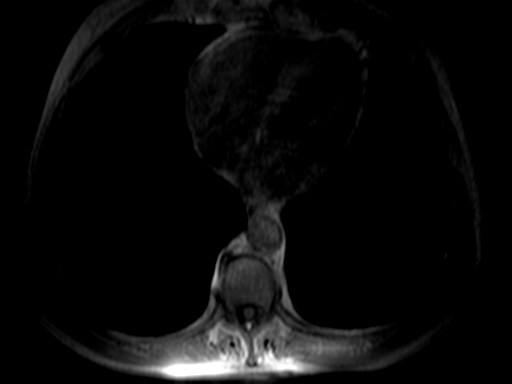

[Series 11: T2 · axial · 7.0mm · 1.48mm/px · z∈[-92,+168]mm · 2 of 32 slices shown (2 of 2)]
[im 1/32]
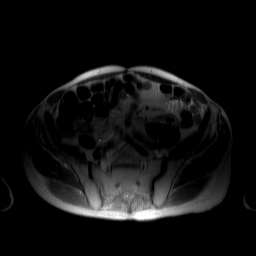
[im 32/32]
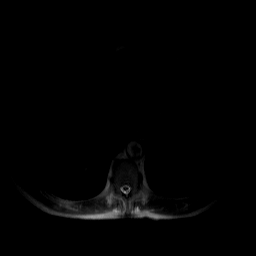

[Series 12: axial true fisp · axial · 4.0mm · 0.74mm/px · z∈[-90,+166]mm · 3 of 65 slices shown]
[im 1/65]
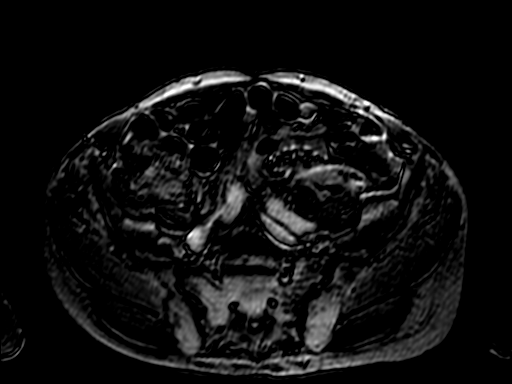
[im 33/65]
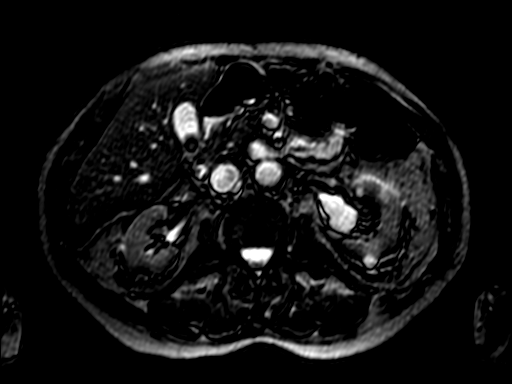
[im 65/65]
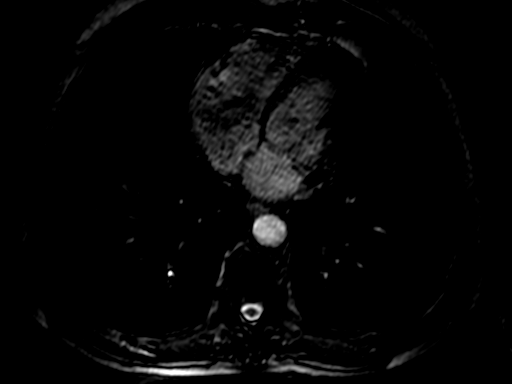

[Series 24: (id) sub · axial · 4.0mm · 0.74mm/px · z∈[-115,+169]mm · 3 of 72 slices shown (1 of 3)]
[im 1/72]
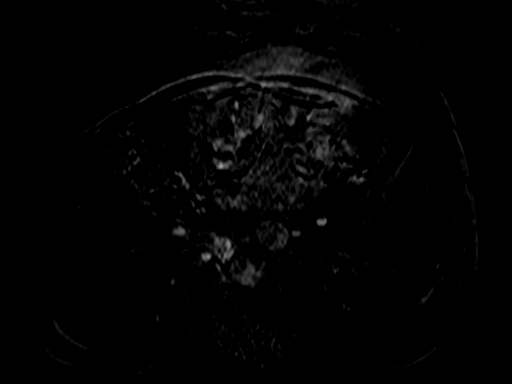
[im 36/72]
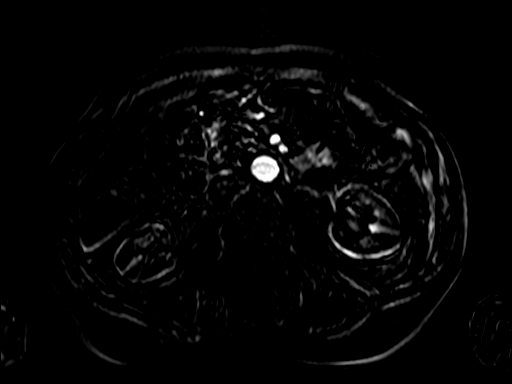
[im 72/72]
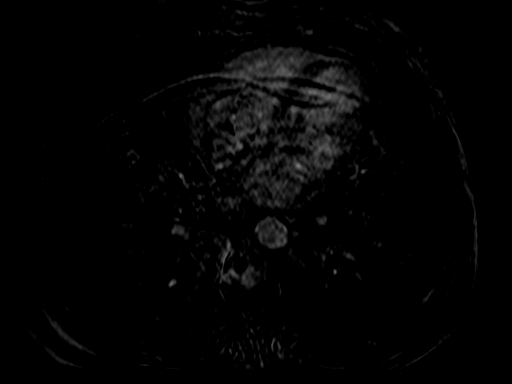

[Series 25: (id) sub · axial · 4.0mm · 0.74mm/px · z∈[-115,+169]mm · 3 of 72 slices shown (2 of 3)]
[im 1/72]
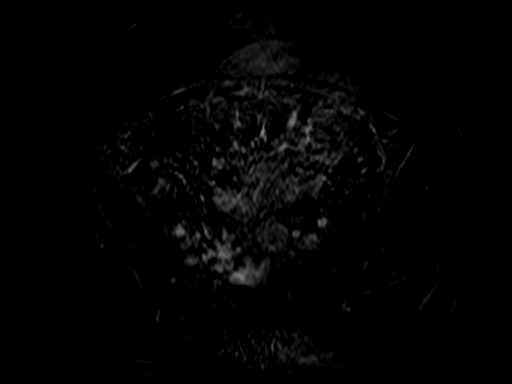
[im 36/72]
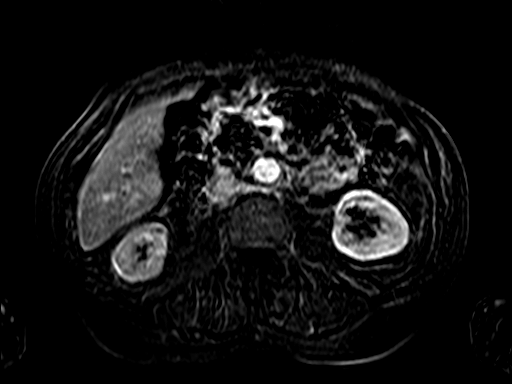
[im 72/72]
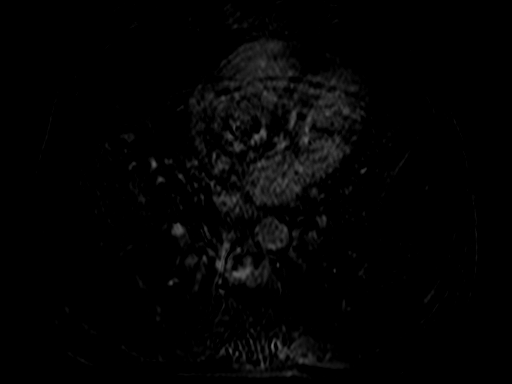

[Series 26: (id) sub · axial · 4.0mm · 0.74mm/px · z∈[-115,+169]mm · 3 of 72 slices shown (3 of 3)]
[im 1/72]
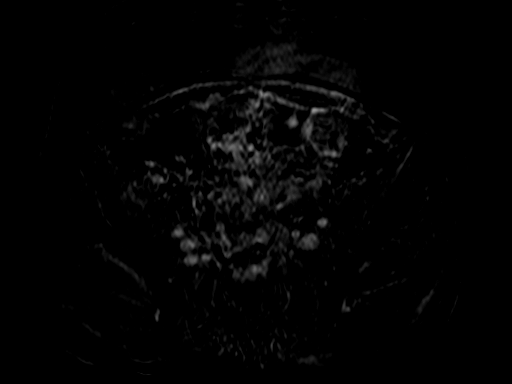
[im 36/72]
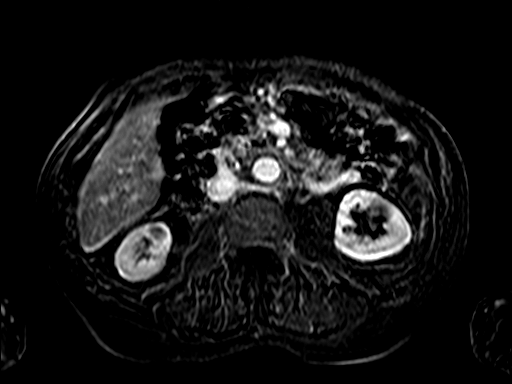
[im 72/72]
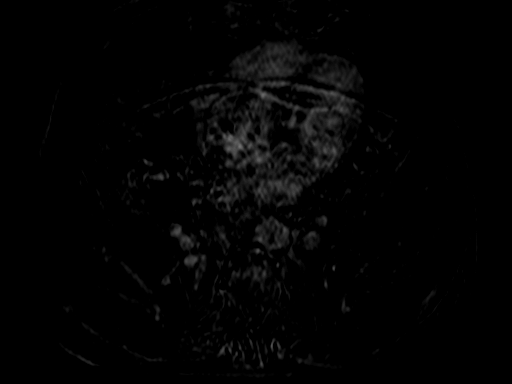

[Series 27: 3min sub · axial · 4.0mm · 0.74mm/px · z∈[-115,+169]mm · 3 of 72 slices shown]
[im 1/72]
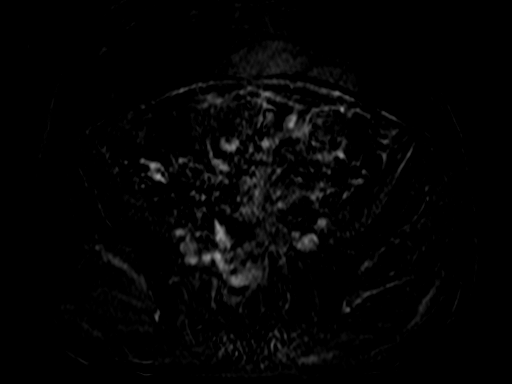
[im 36/72]
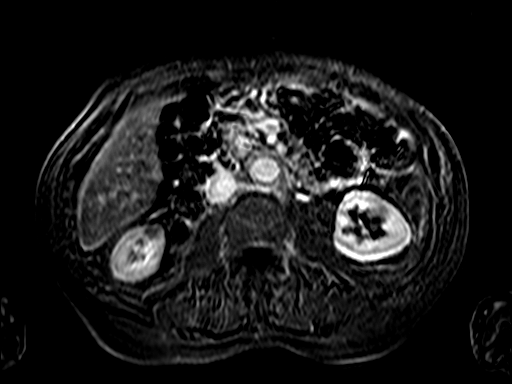
[im 72/72]
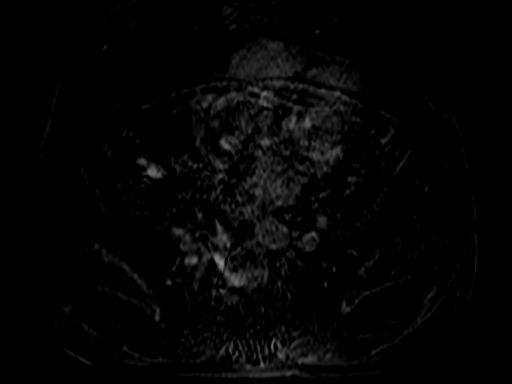

[26 of 48 positions shown; findings below may reference images not displayed]

FINDINGS: Lower chest: No gross mass or consolidation at the visualized lung
bases.

Hepatobiliary: Normal liver size and configuration. No hepatic
steatosis. No liver mass. Normal gallbladder with no cholelithiasis.
No biliary ductal dilatation. Common bile duct diameter 4 mm. No
choledocholithiasis.

Pancreas: The pancreas is diffusely mildly atrophic. No pancreatic
mass or duct dilation (main pancreatic duct is uniformly top-normal
in caliber, approximately 2 mm diameter). No pancreas divisum.

Spleen: Normal size. No mass.

Adrenals/Urinary Tract: Normal adrenals. No hydronephrosis. There is
a 0.9 cm renal lesion in the anterior interpolar left kidney with
precontrast T1 hyperintensity (series 16/ image 30), without
appreciable enhancement, in keeping with a Bosniak category 2
hemorrhagic/proteinaceous renal cyst. There are several Bosniak
category 1 simple renal cysts in both kidneys measuring up to 2.2 cm
in the right lower kidney and 2.4 cm in the left lower kidney. No
enhancing renal masses.

Stomach/Bowel: Grossly normal stomach. Visualized small and large
bowel is normal caliber, with no bowel wall thickening.

Vascular/Lymphatic: There is a 4.0 cm infrarenal abdominal aortic
aneurysm, not appreciably changed since 02/09/2015 using similar
measurement technique. Patent portal, splenic, hepatic and renal
veins. No pathologically enlarged lymph nodes in the abdomen.

Other: No abdominal ascites or focal fluid collection.

Musculoskeletal: No aggressive appearing focal osseous lesions.
IMPRESSION: 1. Diffuse mild pancreatic atrophy. No pancreatic mass. Top-normal
caliber main pancreatic duct. Normal liver, gallbladder and bile
ducts.
2. Bosniak category 1 and category 2 renal cysts. No enhancing renal
masses.
3. Stable 4.0 cm infrarenal abdominal aortic aneurysm. Recommend
followup by ultrasound in 1 year. This recommendation follows ACR
consensus guidelines: White Paper of the ACR Incidental Findings
Committee II on Vascular Findings. [HOSPITAL] 9684;

## 2017-02-25 ENCOUNTER — Other Ambulatory Visit: Payer: Self-pay | Admitting: Internal Medicine

## 2017-02-28 ENCOUNTER — Encounter: Payer: Self-pay | Admitting: Internal Medicine

## 2017-03-01 MED ORDER — AMLODIPINE BESY-BENAZEPRIL HCL 5-10 MG PO CAPS: 1.0000 | ORAL_CAPSULE | Freq: Every day | ORAL | 3 refills | Status: DC

## 2017-03-01 NOTE — Addendum Note (Signed)
Addended by: Pilar Grammes on: 03/01/2017 10:03 AM   Modules accepted: Orders

## 2017-10-11 ENCOUNTER — Ambulatory Visit (INDEPENDENT_AMBULATORY_CARE_PROVIDER_SITE_OTHER): Payer: Federal, State, Local not specified - PPO | Admitting: Internal Medicine

## 2017-10-11 ENCOUNTER — Encounter: Payer: Self-pay | Admitting: Internal Medicine

## 2017-10-11 VITALS — BP 116/76 | HR 49 | Temp 97.5°F | Ht 73.5 in | Wt 183.0 lb

## 2017-10-11 DIAGNOSIS — I714 Abdominal aortic aneurysm, without rupture, unspecified: Secondary | ICD-10-CM

## 2017-10-11 DIAGNOSIS — K8689 Other specified diseases of pancreas: Secondary | ICD-10-CM

## 2017-10-11 DIAGNOSIS — I10 Essential (primary) hypertension: Secondary | ICD-10-CM

## 2017-10-11 DIAGNOSIS — Z0000 Encounter for general adult medical examination without abnormal findings: Secondary | ICD-10-CM

## 2017-10-11 DIAGNOSIS — I482 Chronic atrial fibrillation: Secondary | ICD-10-CM

## 2017-10-11 DIAGNOSIS — M05742 Rheumatoid arthritis with rheumatoid factor of left hand without organ or systems involvement: Secondary | ICD-10-CM

## 2017-10-11 DIAGNOSIS — I4821 Permanent atrial fibrillation: Secondary | ICD-10-CM

## 2017-10-11 DIAGNOSIS — M05741 Rheumatoid arthritis with rheumatoid factor of right hand without organ or systems involvement: Secondary | ICD-10-CM | POA: Diagnosis not present

## 2017-10-11 DIAGNOSIS — Z23 Encounter for immunization: Secondary | ICD-10-CM | POA: Diagnosis not present

## 2017-10-11 LAB — CBC
HCT: 35.6 % — ABNORMAL LOW (ref 39.0–52.0)
Hemoglobin: 12 g/dL — ABNORMAL LOW (ref 13.0–17.0)
MCHC: 33.8 g/dL (ref 30.0–36.0)
MCV: 100.5 fl — AB (ref 78.0–100.0)
Platelets: 190 10*3/uL (ref 150.0–400.0)
RBC: 3.54 Mil/uL — ABNORMAL LOW (ref 4.22–5.81)
RDW: 13.1 % (ref 11.5–15.5)
WBC: 5.1 10*3/uL (ref 4.0–10.5)

## 2017-10-11 LAB — COMPREHENSIVE METABOLIC PANEL
ALT: 12 U/L (ref 0–53)
AST: 22 U/L (ref 0–37)
Albumin: 3.9 g/dL (ref 3.5–5.2)
Alkaline Phosphatase: 44 U/L (ref 39–117)
BUN: 27 mg/dL — ABNORMAL HIGH (ref 6–23)
CO2: 27 meq/L (ref 19–32)
Calcium: 9.3 mg/dL (ref 8.4–10.5)
Chloride: 105 mEq/L (ref 96–112)
Creatinine, Ser: 1.48 mg/dL (ref 0.40–1.50)
GFR: 48.97 mL/min — AB (ref >60.00)
GLUCOSE: 91 mg/dL (ref 70–99)
POTASSIUM: 4.7 meq/L (ref 3.5–5.1)
Sodium: 138 mEq/L (ref 135–145)
Total Bilirubin: 0.4 mg/dL (ref 0.2–1.2)
Total Protein: 7.7 g/dL (ref 6.0–8.3)

## 2017-10-11 LAB — LIPID PANEL
CHOL/HDL RATIO: 3
Cholesterol: 112 mg/dL (ref 0–200)
HDL: 43.8 mg/dL (ref >39.00)
LDL CALC: 57 mg/dL (ref 0–99)
NonHDL: 68.23
TRIGLYCERIDES: 54 mg/dL (ref 0.0–149.0)
VLDL: 10.8 mg/dL (ref 0.0–40.0)

## 2017-10-11 LAB — T4, FREE: Free T4: 0.82 ng/dL (ref 0.60–1.60)

## 2017-10-11 LAB — TSH: TSH: 14.92 u[IU]/mL — AB (ref 0.35–4.50)

## 2017-10-11 NOTE — Assessment & Plan Note (Signed)
Healthy Pneumovax booster Done with cancer screening Discussed exercise/resistance training

## 2017-10-11 NOTE — Assessment & Plan Note (Signed)
BP Readings from Last 3 Encounters:  10/11/17 116/76  10/12/16 134/80  02/14/16 140/78   Good control still

## 2017-10-11 NOTE — Progress Notes (Signed)
Subjective:    Patient ID: Damon Torres, male    DOB: 1940-08-08, 77 y.o.   MRN: [id number]  HPI Here for physical Does lots of yard work---no set exercise (discussed) Plans to stop all part time work this summer  He is doing well Still on embrel from Dr Jefm Bryant This is controlling the RA  Ultrasound last summer---AAA still small Yearly follow up On statin  Sees the cardiologist yearly No symptoms from the atrial fib Continues on xarelto  Current Outpatient Medications on File Prior to Visit  Medication Sig Dispense Refill  . amLODipine-benazepril (LOTREL) 5-10 MG capsule Take 1 capsule by mouth daily. 90 capsule 3  . calcium-vitamin D (OSCAL WITH D) 500-200 MG-UNIT per tablet Take 1 tablet by mouth daily with breakfast.    . etanercept (ENBREL) 50 MG/ML injection Inject 50 mg into the skin once a week.    . levothyroxine (SYNTHROID, LEVOTHROID) 75 MCG tablet Take 1 tablet (75 mcg total) by mouth daily before breakfast. 90 tablet 3  . metoprolol succinate (TOPROL-XL) 25 MG 24 hr tablet Take 25 mg by mouth daily.     . Omega-3 Fatty Acids (FISH OIL) 1000 MG CAPS Take 1 capsule by mouth daily.    . Pancrelipase, Lip-Prot-Amyl, (ZENPEP) 40000 units CPEP Take 4 capsules by mouth 3 (three) times daily before meals. Take 4 capsules by mouth 3 times a day before meals. Take up to 2 before all snacks    . rivaroxaban (XARELTO) 20 MG TABS tablet Take 20 mg by mouth daily with supper.    . rosuvastatin (CRESTOR) 5 MG tablet Take 1 tablet by mouth daily.     No current facility-administered medications on file prior to visit.     No Known Allergies  Past Medical History:  Diagnosis Date  . AAA (abdominal aortic aneurysm) ([place]) 12/16   4cm infrarenal  . Atrial fibrillation ([place]) 11/14  . Collagen vascular disease ([place])   . Hypertension   . Pancreatic insufficiency   . Rectal cancer ([place]) 2000   At [facility] -stage 3. RT and chemo also  . Rheumatoid arthritis(714.0)   .  Subclinical hypothyroidism 2014    Past Surgical History:  Procedure Laterality Date  . CATARACT EXTRACTION W/PHACO Right 02/14/2016   Procedure: CATARACT EXTRACTION PHACO AND INTRAOCULAR LENS PLACEMENT (IOC);  Surgeon: Birder Robson, MD;  Location: [facility] ORS;  Service: Ophthalmology;  Laterality: Right;  [place] 01:02 AP% 18.7 CDE 11.59 Fluid pack lot # [id number]  . COLONOSCOPY    . COLONOSCOPY WITH PROPOFOL N/A 01/27/2015   Procedure: COLONOSCOPY WITH PROPOFOL;  Surgeon: Josefine Class, MD;  Location: [facility] ENDOSCOPY;  Service: Endoscopy;  Laterality: N/A;  . Right foot surgery Right 2/14   toes pinned  . TRANSANAL RECTAL RESECTION  2000   [facility]  . VARICOSE VEIN SURGERY  1979    Family History  Problem Relation Age of Onset  . Liver cancer Mother   . Breast cancer Mother   . Heart disease Mother   . Heart disease Brother        CABG and valve repair  . Hypertension Brother   . Colon cancer Neg Hx   . Esophageal cancer Neg Hx   . Rectal cancer Neg Hx   . Stomach cancer Neg Hx   . Diabetes Neg Hx     Social History   Socioeconomic History  . Marital status: Married    Spouse name: Not on file  . Number of children: 2  .  Years of education: Not on file  . Highest education level: Not on file  Occupational History  . 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 Financial resource strain: Not on file  . Food insecurity:    Worry: Not on file    Inability: Not on file  . Transportation needs:    Medical: Not on file    Non-medical: Not on file  Tobacco Use  . Smoking status: Former Research scientist (life sciences)  . Smokeless tobacco: Never Used  Substance and Sexual Activity  . Alcohol use: No  . Drug use: No  . Sexual activity: Not on file  Lifestyle  . Physical activity:    Days per week: Not on file    Minutes per session: Not on file  . Stress: Not on file  Relationships  . Social connections:    Talks  on phone: Not on file    Gets together: Not on file    Attends religious service: Not on file    Active member of club or organization: Not on file    Attends meetings of clubs or organizations: Not on file    Relationship status: Not on file  . Intimate partner violence:    Fear of current or ex partner: Not on file    Emotionally abused: Not on file    Physically abused: Not on file    Forced sexual activity: Not on file  Other Topics Concern  . Not on file  Social History Narrative   Now has living will   Wife is health care POA---alternate is daughter   Would accept resuscitation attempts   Would not want tube feeds if cognitively unaware   Review of Systems  Constitutional: Negative for fatigue and unexpected weight change.       Wears seat belt  HENT: Negative for dental problem, hearing loss and tinnitus.        Keeps up with dentist  Eyes: Negative for visual disturbance.       No diplopia or unilateral vision loss  Respiratory: Negative for cough, chest tightness and shortness of breath.   Cardiovascular: Negative for chest pain and palpitations.       Slight ankle edema at times  Gastrointestinal: Negative for abdominal pain and blood in stool.       Frequent stools but no change----still on the enzymes No heartburn  Endocrine: Negative for polydipsia and polyuria.  Genitourinary: Negative for difficulty urinating and urgency.       Stable nocturia x 2  Musculoskeletal: Negative for arthralgias, back pain and joint swelling.  Skin: Negative for rash.       No suspicious lesions  Allergic/Immunologic: Negative for environmental allergies and immunocompromised state.  Neurological: Negative for dizziness, syncope, light-headedness and headaches.  Hematological: Negative for adenopathy. Bruises/bleeds easily.  Psychiatric/Behavioral: Negative for dysphoric mood. The patient is not nervous/anxious.        Some sleep problems---may be related to daytime naps         Objective:   Physical Exam  Constitutional: He appears well-developed. No distress.  HENT:  Head: Normocephalic and atraumatic.  Right Ear: External ear normal.  Left Ear: External ear normal.  Mouth/Throat: Oropharynx is clear and moist. No oropharyngeal exudate.  Eyes: Pupils are equal, round, and reactive to light. EOM are normal.  Neck: No thyromegaly present.  Cardiovascular: Normal rate, regular rhythm, normal heart sounds and intact distal pulses.  Exam reveals no gallop.  No murmur heard. Pulmonary/Chest: Effort normal and breath sounds normal. No respiratory distress. He has no wheezes. He has no rales.  Abdominal: Soft. He exhibits no distension and no mass. There is no tenderness.  Musculoskeletal: He exhibits no tenderness.  Slight ankle edema No active synovitis  Lymphadenopathy:    He has no cervical adenopathy.  Skin: No rash noted.  No suspicious lesions  Psychiatric: He has a normal mood and affect. His behavior is normal.          Assessment & Plan:

## 2017-10-11 NOTE — Addendum Note (Signed)
Addended by: Pilar Grammes on: 10/11/2017 10:47 AM   Modules accepted: Orders

## 2017-10-11 NOTE — Assessment & Plan Note (Signed)
Quiet on the enbrel Due for labs

## 2017-10-11 NOTE — Assessment & Plan Note (Signed)
Rate is controlled On xarelto

## 2017-10-11 NOTE — Assessment & Plan Note (Signed)
Will continue yearly checks On statin

## 2017-10-11 NOTE — Assessment & Plan Note (Signed)
Continues on enzymes

## 2017-10-18 ENCOUNTER — Encounter: Payer: Federal, State, Local not specified - PPO | Admitting: Internal Medicine

## 2017-10-23 ENCOUNTER — Other Ambulatory Visit: Payer: Self-pay | Admitting: Internal Medicine

## 2017-11-15 DIAGNOSIS — I495 Sick sinus syndrome: Secondary | ICD-10-CM | POA: Insufficient documentation

## 2018-01-27 ENCOUNTER — Other Ambulatory Visit: Payer: Self-pay | Admitting: Internal Medicine

## 2018-04-24 MED ORDER — PANCRELIPASE (LIP-PROT-AMYL) 36000-114000 UNITS PO CPEP: ORAL_CAPSULE | ORAL | 11 refills | Status: DC

## 2018-04-24 NOTE — Telephone Encounter (Signed)
Go ahead and send a year's worth of the 36000 dose

## 2018-04-24 NOTE — Telephone Encounter (Signed)
Please check with him to see if it is okay if we refill the 36000 units one---that is all I see

## 2018-04-24 NOTE — Telephone Encounter (Signed)
I tried to build the rx because it will not allow me to refill it from the med list. When I look it up, I cannot find the same med that he has in med list. Was not sure which to select.

## 2018-04-24 NOTE — Telephone Encounter (Signed)
He said that was fine. I had pulled one over for 36000 but I sent it to the wrong pharmacy. Was that the right one? I will send it to mail order if so.

## 2018-04-25 MED ORDER — PANCRELIPASE (LIP-PROT-AMYL) 36000-114000 UNITS PO CPEP: ORAL_CAPSULE | ORAL | 3 refills | Status: DC

## 2018-07-29 ENCOUNTER — Other Ambulatory Visit: Payer: Self-pay | Admitting: Internal Medicine

## 2018-09-24 ENCOUNTER — Other Ambulatory Visit: Payer: Self-pay | Admitting: Internal Medicine

## 2018-10-15 ENCOUNTER — Encounter: Payer: Federal, State, Local not specified - PPO | Admitting: Internal Medicine

## 2018-10-24 ENCOUNTER — Other Ambulatory Visit: Payer: Self-pay | Admitting: Internal Medicine

## 2018-12-15 DIAGNOSIS — R198 Other specified symptoms and signs involving the digestive system and abdomen: Secondary | ICD-10-CM

## 2018-12-16 ENCOUNTER — Telehealth: Payer: Self-pay | Admitting: Gastroenterology

## 2018-12-16 NOTE — Telephone Encounter (Signed)
Patient is being referred to our office for Change in bowel function. He left our practice and went to [facility] and would like to transfer back and see Dr. 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 Records from [facility] are in [place]. Dr. Loletha Carrow please advise for scheduleding.

## 2018-12-17 NOTE — Telephone Encounter (Signed)
Appears to be a complex case last seen by Dr. Hilarie Fredrickson in 2013, then managed by [facility] clinic since then (last seen there in 2018). I will copy to Dr. Hilarie Fredrickson for review, as we generally keep patients with same provider.  Since Damon Torres has done extensive testing and knows his condition well, I also recommend he consider contacting them for at least one visit to reassess and recommend treatment plan since he is having trouble.

## 2018-12-18 NOTE — Telephone Encounter (Signed)
Pt called and was given Dr. Loletha Carrow' recommendations.

## 2019-01-06 ENCOUNTER — Other Ambulatory Visit: Payer: Self-pay | Admitting: Internal Medicine

## 2019-01-06 DIAGNOSIS — E782 Mixed hyperlipidemia: Secondary | ICD-10-CM | POA: Insufficient documentation

## 2019-01-06 HISTORY — DX: Mixed hyperlipidemia: E78.2

## 2019-01-22 ENCOUNTER — Encounter: Payer: Self-pay | Admitting: Podiatry

## 2019-01-22 ENCOUNTER — Ambulatory Visit: Payer: Federal, State, Local not specified - PPO | Admitting: Podiatry

## 2019-01-22 ENCOUNTER — Other Ambulatory Visit: Payer: Self-pay

## 2019-01-22 DIAGNOSIS — R58 Hemorrhage, not elsewhere classified: Secondary | ICD-10-CM

## 2019-01-22 DIAGNOSIS — I739 Peripheral vascular disease, unspecified: Secondary | ICD-10-CM

## 2019-01-22 HISTORY — DX: Peripheral vascular disease, unspecified: I73.9

## 2019-01-22 HISTORY — DX: Hemorrhage, not elsewhere classified: R58

## 2019-01-22 NOTE — Addendum Note (Signed)
Addended by: Graceann Congress D on: 01/22/2019 03:57 PM   Modules accepted: Orders

## 2019-01-22 NOTE — Progress Notes (Signed)
This patient presents the office with chief complaint of an ingrown toenail on the outside border of the left great toe.  He says that that has been present for years and he has been self treating.  He also presents to the office with a purplish discoloration along the lateral border of the left great toe/.  Patient says there is no history of  redness swelling or infection or drainage from the site.  Patient also states there is no pain associated with the purplish discoloration today.  Patient has history of surgery on the big toe joint left foot years ago.  He also has atrial fibrillation and is presently taking Xarelto.  He presents the office today for an evaluation of his left big toe.   Vascular  Dorsalis Pedis and posterior tibial pulses are not palpable left foot.    Capillary return  WNL.  Temperature gradient is diminished..  Skin turgor  WNL  Sensorium  Senn Weinstein monofilament wire  WNL. Normal tactile sensation.  Nail Exam  Patient has normal nails with no evidence of bacterial or fungal infection.  No evidence of redness or granulation tissue lateral border left hallux.  Orthopedic  Exam  Muscle tone and muscle strength  WNL.  No limitations of motion feet  B/L.  No crepitus or joint effusion noted.  Foot type is unremarkable and digits show no abnormalities.  No ROM 1st MPJ  Left foot.  Skin  No open lesions.  Normal skin texture and turgor.  Purplish discoloration lateral border left hallux.    Possible vascular disease  Ecchymosis lateral border left hallux.  IE.  Discussed this condition with this patient.  Told this patient that there are no evidence of infection to the lateral border left great toe.  Told this patient I was concerned about the purplish discoloration on the lateral border of the left great toe.  I told him I would like to schedule him for a vascular study to be performed to rule out pathology.  Angie   wil be in the office  will be in the office tomorrow and  schedule his appointment at that time.   Gardiner Barefoot DPM

## 2019-02-06 ENCOUNTER — Ambulatory Visit (INDEPENDENT_AMBULATORY_CARE_PROVIDER_SITE_OTHER): Payer: Federal, State, Local not specified - PPO | Admitting: Vascular Surgery

## 2019-02-06 ENCOUNTER — Encounter (INDEPENDENT_AMBULATORY_CARE_PROVIDER_SITE_OTHER): Payer: Self-pay | Admitting: Vascular Surgery

## 2019-02-06 ENCOUNTER — Other Ambulatory Visit: Payer: Self-pay

## 2019-02-06 VITALS — BP 155/77 | HR 65 | Resp 12 | Ht 75.0 in | Wt 179.0 lb

## 2019-02-06 DIAGNOSIS — E782 Mixed hyperlipidemia: Secondary | ICD-10-CM

## 2019-02-06 DIAGNOSIS — I739 Peripheral vascular disease, unspecified: Secondary | ICD-10-CM

## 2019-02-06 DIAGNOSIS — C20 Malignant neoplasm of rectum: Secondary | ICD-10-CM | POA: Insufficient documentation

## 2019-02-06 DIAGNOSIS — I10 Essential (primary) hypertension: Secondary | ICD-10-CM

## 2019-02-06 DIAGNOSIS — I714 Abdominal aortic aneurysm, without rupture, unspecified: Secondary | ICD-10-CM

## 2019-02-06 NOTE — Assessment & Plan Note (Signed)
lipid control important in reducing the progression of atherosclerotic disease. Continue statin therapy  

## 2019-02-06 NOTE — Assessment & Plan Note (Signed)
Although this was below 4 cm at last check, I do not see a study to evaluate this since 2018.  We will obtain an aortoiliac duplex both to look the occlusive disease in the aneurysm.  This could be causing peripheral embolization and if it is a recurrent problem fixing the aneurysm would be prudent to avoid continued peripheral embolization even if the size is small

## 2019-02-06 NOTE — Patient Instructions (Signed)
Peripheral Vascular Disease  Peripheral vascular disease (PVD) is a disease of the blood vessels that are not part of your heart and brain. A simple term for PVD is poor circulation. In most cases, PVD narrows the blood vessels that carry blood from your heart to the rest of your body. This can reduce the supply of blood to your arms, legs, and internal organs, like your stomach or kidneys. However, PVD most often affects a person's lower legs and feet. Without treatment, PVD tends to get worse. PVD can also lead to acute ischemic limb. This is when an arm or leg suddenly cannot get enough blood. This is a medical emergency. Follow these instructions at home: Lifestyle  Do not use any products that contain nicotine or tobacco, such as cigarettes and e-cigarettes. If you need help quitting, ask your doctor.  Lose weight if you are overweight. Or, stay at a healthy weight as told by your doctor.  Eat a diet that is low in fat and cholesterol. If you need help, ask your doctor.  Exercise regularly. Ask your doctor for activities that are right for you. General instructions  Take over-the-counter and prescription medicines only as told by your doctor.  Take good care of your feet: ? Wear comfortable shoes that fit well. ? Check your feet often for any cuts or sores.  Keep all follow-up visits as told by your doctor This is important. Contact a doctor if:  You have cramps in your legs when you walk.  You have leg pain when you are at rest.  You have coldness in a leg or foot.  Your skin changes.  You are unable to get or have an erection (erectile dysfunction).  You have cuts or sores on your feet that do not heal. Get help right away if:  Your arm or leg turns cold, numb, and blue.  Your arms or legs become red, warm, swollen, painful, or numb.  You have chest pain.  You have trouble breathing.  You suddenly have weakness in your face, arm, or leg.  You become very  confused or you cannot speak.  You suddenly have a very bad headache.  You suddenly cannot see. Summary  Peripheral vascular disease (PVD) is a disease of the blood vessels.  A simple term for PVD is poor circulation. Without treatment, PVD tends to get worse.  Treatment may include exercise, low fat and low cholesterol diet, and quitting smoking. This information is not intended to replace advice given to you by your health care provider. Make sure you discuss any questions you have with your health care provider. Document Released: 08/15/2009 Document Revised: 05/03/2017 Document Reviewed: 06/28/2016 Elsevier Patient Education  2020 [employer]  

## 2019-02-06 NOTE — Progress Notes (Signed)
Patient ID: Damon Torres, male   DOB: [birth year], 78 y.o.   MRN: [id number]  Chief Complaint  Patient presents with   New Patient (Initial Visit)    HPI Damon Torres is a 78 y.o. male.  I am asked to see the patient by Dr. Prudence Davidson for evaluation of painful and discolored left great toe.  This happened spontaneously a few weeks ago.  He thought he had an ingrown toenail but when he went to the podiatrist they were very concerned about his circulation.  After a few days, it improved although it is still mildly discolored.  The pain is better.  He reports no trauma or injury to the toe.  There is no clear inciting event or causative factor that started the symptoms.  He has a known history of a small to medium size abdominal aortic aneurysm less than 4 cm at last check.  There is also associated iliac artery occlusive disease seen on that study.  That was 2018.  He does not describe typical claudication symptoms in the lower extremity.  He has no ulceration or infection.  No current right leg symptoms.     Past Medical History:  Diagnosis Date   AAA (abdominal aortic aneurysm) ([place]) 12/16   4cm infrarenal   Atrial fibrillation ([place]) 11/14   Collagen vascular disease (HCC)    Hypertension    Pancreatic insufficiency    Rectal cancer ([place]) 2000   At [facility] -stage 3. RT and chemo also   Rheumatoid arthritis(714.0)    Subclinical hypothyroidism 2014    Past Surgical History:  Procedure Laterality Date   CATARACT EXTRACTION W/PHACO Right 02/14/2016   Procedure: CATARACT EXTRACTION PHACO AND INTRAOCULAR LENS PLACEMENT ([place]);  Surgeon: Birder Robson, MD;  Location: [facility] ORS;  Service: Ophthalmology;  Laterality: Right;  [place] 01:02 AP% 18.7 CDE 11.59 Fluid pack lot # [id number]   COLONOSCOPY     COLONOSCOPY WITH PROPOFOL N/A 01/27/2015   Procedure: COLONOSCOPY WITH PROPOFOL;  Surgeon: Josefine Class, MD;  Location: [facility] ENDOSCOPY;  Service: Endoscopy;  Laterality: N/A;    Right foot surgery Right 2/14   toes pinned   [facility]SA[facility]  [facility]   VARICOSE VEIN SURGERY  1979    Family History Family History  Problem Relation Age of Onset   Liver cancer Mother    Breast cancer Mother    Heart disease Mother    Heart disease Brother        CABG and valve repair   Hypertension Brother    Colon cancer Neg Hx    Esophageal cancer Neg Hx    Rectal cancer Neg Hx    Stomach cancer Neg Hx    Diabetes Neg Hx     Social History Social History   Tobacco Use   Smoking status: Former Smoker   Smokeless tobacco: Never Used  Substance Use Topics   Alcohol use: No   Drug use: No     No Known Allergies  Current Outpatient Medications  Medication Sig Dispense Refill   amLODipine-benazepril (LOTREL) 5-10 MG capsule TAKE 1 CAPSULE BY MOUTH DAILY 90 capsule 1   budesonide (ENTOCORT EC) 3 MG 24 hr capsule      calcium-vitamin D (OSCAL WITH D) 500-200 MG-UNIT per tablet Take 1 tablet by mouth daily with breakfast.     etanercept (ENBREL) 50 MG/ML injection Inject 50 mg into the skin once a week.     levothyroxine (SYNTHROID) 75 MCG  tablet TAKE 1 TABLET DAILY BEFORE BREAKFAST **MFR MYLAN** 90 tablet 3   lipase/protease/amylase (CREON) 36000 UNITS CPEP capsule Take 4 capsules by mouth 3 times a day before meals. Take up to 2 before all snacks 1620 capsule 3   Omega-3 Fatty Acids (FISH OIL) 1000 MG CAPS Take 1 capsule by mouth daily.     rivaroxaban (XARELTO) 20 MG TABS tablet Take 20 mg by mouth daily with supper.     rosuvastatin (CRESTOR) 5 MG tablet Take 1 tablet by mouth daily.     No current facility-administered medications for this visit.       REVIEW OF SYSTEMS (Negative unless checked)  Constitutional: [] Weight loss  [] Fever  [] Chills Cardiac: [] Chest pain   [] Chest pressure   [] Palpitations   [] Shortness of breath when laying flat   [] Shortness of breath at rest   [] Shortness of breath with  exertion. Vascular:  [] Pain in legs with walking   [] Pain in legs at rest   [] Pain in legs when laying flat   [] Claudication   [] Pain in feet when walking  [x] Pain in feet at rest  [] Pain in feet when laying flat   [] History of DVT   [] Phlebitis   [] Swelling in legs   [] Varicose veins   [] Non-healing ulcers Pulmonary:   [] Uses home oxygen   [] Productive cough   [] Hemoptysis   [] Wheeze  [] COPD   [] Asthma Neurologic:  [] Dizziness  [] Blackouts   [] Seizures   [] History of stroke   [] History of TIA  [] Aphasia   [] Temporary blindness   [] Dysphagia   [] Weakness or numbness in arms   [] Weakness or numbness in legs Musculoskeletal:  [x] Arthritis   [] Joint swelling   [] Joint pain   [] Low back pain Hematologic:  [] Easy bruising  [] Easy bleeding   [] Hypercoagulable state   [] Anemic  [] Hepatitis Gastrointestinal:  [] Blood in stool   [] Vomiting blood  [] Gastroesophageal reflux/heartburn   [] Abdominal pain Genitourinary:  [] Chronic kidney disease   [] Difficult urination  [] Frequent urination  [] Burning with urination   [] Hematuria Skin:  [] Rashes   [] Ulcers   [] Wounds Psychological:  [] History of anxiety   []  History of major depression.    Physical Exam BP (!) 155/77 (BP Location: Left Arm, Patient Position: Sitting, Cuff Size: Normal)    Pulse 65    Resp 12    Ht 6\' 3"  (1.905 m)    Wt 179 lb (81.2 kg)    BMI 22.37 kg/m  Gen:  WD/WN, NAD Head: [place]/AT, No temporalis wasting.  Ear/Nose/Throat: Hearing grossly intact, nares w/o erythema or drainage, oropharynx w/o Erythema/Exudate Eyes: Conjunctiva clear, sclera non-icteric  Neck: trachea midline.  No JVD.  Pulmonary:  Good air movement, respirations not labored, no use of accessory muscles  Cardiac: RRR, no JVD Vascular:  Vessel Right Left  Radial Palpable Palpable                          DP  1+  not palpable  PT  2+  1+   Gastrointestinal:. No masses, surgical incisions, or scars. Musculoskeletal: M/S 5/5 throughout.  Extremities without  ischemic changes.  No deformity or atrophy.  Left great toe is mildly discolored currently.  He shows me a picture from a couple of weeks ago where it was quite purple.  1+ left lower extremity edema, trace to 1+ right lower extremity edema. Neurologic: Sensation grossly intact in extremities.  Symmetrical.  Speech is fluent. Motor exam as listed above. Psychiatric:  Judgment intact, Mood & affect appropriate for pt's clinical situation. Dermatologic: No rashes or ulcers noted.  No cellulitis or open wounds.    Radiology No results found.  Labs No results found for this or any previous visit (from the past 2160 hour(s)).  Assessment/Plan:  Hyperlipidemia, mixed lipid control important in reducing the progression of atherosclerotic disease. Continue statin therapy   Essential hypertension blood pressure control important in reducing the progression of atherosclerotic disease. On appropriate oral medications.   AAA (abdominal aortic aneurysm) ([place]) Although this was below 4 cm at last check, I do not see a study to evaluate this since 2018.  We will obtain an aortoiliac duplex both to look the occlusive disease in the aneurysm.  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  This can be coming from his atherosclerotic occlusive disease or his aneurysm.  ABIs we checked his next visit as well.  Discussions about the pathophysiology and natural history of abdominal aortic aneurysm and peripheral arterial disease were done today.      Leotis Pain 02/06/2019, 11:26 AM   This note was created with Dragon medical transcription system.  Any errors from dictation are unintentional.

## 2019-02-06 NOTE — Assessment & Plan Note (Signed)
blood pressure control important in reducing the progression of atherosclerotic disease. On appropriate oral medications.  

## 2019-02-06 NOTE — Assessment & Plan Note (Signed)
The patient describes what sounds like blue toe syndrome to the left foot.  This can be coming from his atherosclerotic occlusive disease or his aneurysm.  ABIs we checked his next visit as well.  Discussions about the pathophysiology and natural history of abdominal aortic aneurysm and peripheral arterial disease were done today.

## 2019-02-12 ENCOUNTER — Ambulatory Visit (INDEPENDENT_AMBULATORY_CARE_PROVIDER_SITE_OTHER): Payer: Federal, State, Local not specified - PPO | Admitting: Internal Medicine

## 2019-02-12 ENCOUNTER — Encounter: Payer: Self-pay | Admitting: Internal Medicine

## 2019-02-12 ENCOUNTER — Other Ambulatory Visit: Payer: Self-pay

## 2019-02-12 VITALS — BP 134/78 | HR 63 | Temp 98.3°F | Ht 73.25 in | Wt 177.0 lb

## 2019-02-12 DIAGNOSIS — K52832 Lymphocytic colitis: Secondary | ICD-10-CM | POA: Diagnosis not present

## 2019-02-12 DIAGNOSIS — N401 Enlarged prostate with lower urinary tract symptoms: Secondary | ICD-10-CM

## 2019-02-12 DIAGNOSIS — N183 Chronic kidney disease, stage 3 unspecified: Secondary | ICD-10-CM

## 2019-02-12 DIAGNOSIS — E039 Hypothyroidism, unspecified: Secondary | ICD-10-CM

## 2019-02-12 DIAGNOSIS — N138 Other obstructive and reflux uropathy: Secondary | ICD-10-CM | POA: Insufficient documentation

## 2019-02-12 DIAGNOSIS — Z23 Encounter for immunization: Secondary | ICD-10-CM | POA: Diagnosis not present

## 2019-02-12 DIAGNOSIS — I4891 Unspecified atrial fibrillation: Secondary | ICD-10-CM

## 2019-02-12 DIAGNOSIS — I739 Peripheral vascular disease, unspecified: Secondary | ICD-10-CM

## 2019-02-12 DIAGNOSIS — Z0000 Encounter for general adult medical examination without abnormal findings: Secondary | ICD-10-CM

## 2019-02-12 DIAGNOSIS — N1832 Chronic kidney disease, stage 3b: Secondary | ICD-10-CM | POA: Insufficient documentation

## 2019-02-12 DIAGNOSIS — Z7189 Other specified counseling: Secondary | ICD-10-CM

## 2019-02-12 DIAGNOSIS — M05732 Rheumatoid arthritis with rheumatoid factor of left wrist without organ or systems involvement: Secondary | ICD-10-CM

## 2019-02-12 HISTORY — DX: Benign prostatic hyperplasia with lower urinary tract symptoms: N40.1

## 2019-02-12 HISTORY — DX: Benign prostatic hyperplasia with lower urinary tract symptoms: N13.8

## 2019-02-12 HISTORY — DX: Chronic kidney disease, stage 3 unspecified: N18.30

## 2019-02-12 MED ORDER — TAMSULOSIN HCL 0.4 MG PO CAPS: 0.4000 mg | ORAL_CAPSULE | Freq: Every day | ORAL | 3 refills | Status: DC

## 2019-02-12 NOTE — Progress Notes (Signed)
Subjective:    Patient ID: Damon Torres, male    DOB: 1940/08/02, 78 y.o.   MRN: [id number]  HPI Here for physical [facility] to be careful  Having trouble with nocturia--- every 1.5-2 hours Daytime frequency also Flow is very slow at night Ready to start medication  RA is controlled on the enbrel Continues with Dr Jefm Bryant  GI follow up for lymphocytic colitis Budesonide does control  Continues with Dr Lucky Cowboy for the AAA and vascular disease Was referred to him after toe turned blue This is better--uses support hose, etc  GFR 49 on last check Is on ACEI  Current Outpatient Medications on File Prior to Visit  Medication Sig Dispense Refill  . amLODipine-benazepril (LOTREL) 5-10 MG capsule TAKE 1 CAPSULE BY MOUTH DAILY 90 capsule 1  . budesonide (ENTOCORT EC) 3 MG 24 hr capsule     . calcium-vitamin D (OSCAL WITH D) 500-200 MG-UNIT per tablet Take 1 tablet by mouth daily with breakfast.    . etanercept (ENBREL) 50 MG/ML injection Inject 50 mg into the skin once a week.    . levothyroxine (SYNTHROID) 75 MCG tablet TAKE 1 TABLET DAILY BEFORE BREAKFAST **MFR MYLAN** 90 tablet 3  . lipase/protease/amylase (CREON) 36000 UNITS CPEP capsule Take 4 capsules by mouth 3 times a day before meals. Take up to 2 before all snacks 1620 capsule 3  . Omega-3 Fatty Acids (FISH OIL) 1000 MG CAPS Take 1 capsule by mouth daily.    . rivaroxaban (XARELTO) 20 MG TABS tablet Take 20 mg by mouth daily with supper.    . rosuvastatin (CRESTOR) 5 MG tablet Take 1 tablet by mouth daily.     No current facility-administered medications on file prior to visit.     No Known Allergies  Past Medical History:  Diagnosis Date  . AAA (abdominal aortic aneurysm) ([place]) 12/16   4cm infrarenal  . Atrial fibrillation ([place]) 11/14  . Collagen vascular disease ([place])   . Hypertension   . Pancreatic insufficiency   . Rectal cancer ([place]) 2000   At [facility] -stage 3. RT and chemo also  .  Rheumatoid arthritis(714.0)   . Subclinical hypothyroidism 2014    Past Surgical History:  Procedure Laterality Date  . CATARACT EXTRACTION W/PHACO Right 02/14/2016   Procedure: CATARACT EXTRACTION PHACO AND INTRAOCULAR LENS PLACEMENT ([facility]);  Surgeon: Birder Robson, MD;  Location: [facility] ORS;  Service: Ophthalmology;  Laterality: Right;  [place] 01:02 AP% 18.7 CDE 11.59 Fluid pack lot # [id number]  . COLONOSCOPY    . COLONOSCOPY WITH PROPOFOL N/A 01/27/2015   Procedure: COLONOSCOPY WITH PROPOFOL;  Surgeon: Josefine Class, MD;  Location: [facility] ENDOSCOPY;  Service: Endoscopy;  Laterality: N/A;  . Right foot surgery Right 2/14   toes pinned  . TRANSANAL RECTAL RESECTION  [facility]   [facility]  . VARICOSE VEIN SURGERY  1979    Family History  Problem Relation Age of Onset  . Liver cancer Mother   . Breast cancer Mother   . Heart disease Mother   . Heart disease Brother        CABG and valve repair  . Hypertension Brother   . Colon cancer Neg Hx   . Esophageal cancer Neg Hx   . Rectal cancer Neg Hx   . Stomach cancer Neg Hx   . Diabetes Neg Hx     Social History   Socioeconomic History  . Marital status: Married    Spouse name: Not  on file  . Number of children: 2  . Years of education: Not on file  . Highest education level: Not on file  Occupational History  . 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 Financial resource strain: Not on file  . Food insecurity    Worry: Not on file    Inability: Not on file  . Transportation needs    Medical: Not on file    Non-medical: Not on file  Tobacco Use  . Smoking status: Former Research scientist (life sciences)  . Smokeless tobacco: Never Used  Substance and Sexual Activity  . Alcohol use: No  . Drug use: No  . Sexual activity: Not on file  Lifestyle  . Physical activity    Days per week: Not on file    Minutes per session: Not on file  . Stress: Not on file  Relationships  .  Social Herbalist on phone: Not on file    Gets together: Not on file    Attends religious service: Not on file    Active member of club or organization: Not on file    Attends meetings of clubs or organizations: Not on file    Relationship status: Not on file  . Intimate partner violence    Fear of current or ex partner: Not on file    Emotionally abused: Not on file    Physically abused: Not on file    Forced sexual activity: Not on file  Other Topics Concern  . Not on file  Social History Narrative   Widowed 5/20   Now has living will   Daughter is health care POA   Would accept resuscitation attempts   Would not want tube feeds if cognitively unaware   Review of Systems  Constitutional: Negative for fatigue and unexpected weight change.       Wears seat belt  HENT: Negative for dental problem, hearing loss and tinnitus.        Keeps up with dentist  Eyes: Negative for visual disturbance.       No diplopia or unilateral vision loss  Respiratory: Negative for cough, chest tightness and shortness of breath.   Cardiovascular: Positive for leg swelling. Negative for chest pain and palpitations.       Support socks help  Gastrointestinal: Negative for abdominal pain and blood in stool.       No heartburn  Genitourinary: Positive for difficulty urinating and urgency.  Musculoskeletal: Negative for arthralgias, back pain and joint swelling.  Skin: Negative for rash.  Allergic/Immunologic: Negative for environmental allergies and immunocompromised state.  Neurological: Negative for dizziness, syncope, light-headedness and headaches.  Hematological: Negative for adenopathy. Bruises/bleeds easily.  Psychiatric/Behavioral: Negative for dysphoric mood. The patient is not nervous/anxious.        Sleep disturbed by nocturia       Objective:   Physical Exam  Constitutional: He appears well-developed. No distress.  HENT:  Head: Normocephalic and atraumatic.  Right Ear:  External ear normal.  Left Ear: External ear normal.  Mouth/Throat: Oropharynx is clear and moist. No oropharyngeal exudate.  Eyes: Pupils are equal, round, and reactive to light. Conjunctivae are normal.  Neck: No thyromegaly present.  Cardiovascular: Normal rate and normal heart sounds. Exam reveals no gallop.  No murmur heard. Irregular  Faint pedal pulses  Respiratory: Effort normal and breath sounds normal. No respiratory distress. He has no wheezes. He has  no rales.  GI: Soft. There is no abdominal tenderness.  Musculoskeletal:        General: No tenderness.     Comments: Trace ankle edema  Lymphadenopathy:    He has no cervical adenopathy.  Skin: No rash noted. No erythema.  Psychiatric: He has a normal mood and affect. His behavior is normal.           Assessment & Plan:

## 2019-02-12 NOTE — Assessment & Plan Note (Signed)
Seems euthyroid Gets all blood work at [facility] ask them to add thyroid function

## 2019-02-12 NOTE — Assessment & Plan Note (Signed)
Stable on last month's blood work On ACEI

## 2019-02-12 NOTE — Patient Instructions (Signed)
Please ask your [facility] doctors to add thyroid tests to their next blood work

## 2019-02-12 NOTE — Assessment & Plan Note (Signed)
I have personally reviewed the Medicare Annual Wellness questionnaire and have noted 1. The patient's medical and social history 2. Their use of alcohol, tobacco or illicit drugs 3. Their current medications and supplements 4. The patient's functional ability including ADL's, fall risks, home safety risks and hearing or visual             impairment. 5. Diet and physical activities 6. Evidence for depression or mood disorders  The patients weight, height, BMI and visual acuity have been recorded in the chart I have made referrals, counseling and provided education to the patient based review of the above and I have provided the pt with a written personalized care plan for preventive services.  I have provided you with a copy of your personalized plan for preventive services. Please take the time to review along with your updated medication list.  Done with cancer screening Flu vaccine today [facility]ed [facility]

## 2019-02-12 NOTE — Assessment & Plan Note (Signed)
Rate is fine Continues on the xarelto

## 2019-02-12 NOTE — Assessment & Plan Note (Signed)
No pain now.  

## 2019-02-12 NOTE — Assessment & Plan Note (Signed)
Really affecting him now Will try tamsulosin

## 2019-02-12 NOTE — Addendum Note (Signed)
Addended by: Pilar Grammes on: 02/12/2019 03:48 PM   Modules accepted: Orders

## 2019-02-12 NOTE — Assessment & Plan Note (Signed)
Quiet on the budesonide ?

## 2019-02-12 NOTE — Assessment & Plan Note (Signed)
Controlled with the enbrel

## 2019-02-12 NOTE — Assessment & Plan Note (Signed)
See social history 

## 2019-03-17 ENCOUNTER — Other Ambulatory Visit: Payer: Self-pay

## 2019-03-17 ENCOUNTER — Ambulatory Visit (INDEPENDENT_AMBULATORY_CARE_PROVIDER_SITE_OTHER): Payer: Federal, State, Local not specified - PPO

## 2019-03-17 ENCOUNTER — Ambulatory Visit (INDEPENDENT_AMBULATORY_CARE_PROVIDER_SITE_OTHER): Payer: Federal, State, Local not specified - PPO | Admitting: Vascular Surgery

## 2019-03-17 ENCOUNTER — Encounter (INDEPENDENT_AMBULATORY_CARE_PROVIDER_SITE_OTHER): Payer: Self-pay | Admitting: Vascular Surgery

## 2019-03-17 VITALS — BP 156/96 | HR 60 | Resp 16 | Wt 176.2 lb

## 2019-03-17 DIAGNOSIS — I10 Essential (primary) hypertension: Secondary | ICD-10-CM

## 2019-03-17 DIAGNOSIS — I739 Peripheral vascular disease, unspecified: Secondary | ICD-10-CM

## 2019-03-17 DIAGNOSIS — I714 Abdominal aortic aneurysm, without rupture, unspecified: Secondary | ICD-10-CM

## 2019-03-17 DIAGNOSIS — E782 Mixed hyperlipidemia: Secondary | ICD-10-CM

## 2019-03-17 NOTE — Progress Notes (Signed)
MRN : [id number]  Damon Torres is a 78 y.o. ([birth year]) male who presents with chief complaint of  Chief Complaint  Patient presents with  . Follow-up    ultrasound follow up  .  History of Present Illness: Patient returns today in follow up of his abdominal aortic aneurysm with an assessment for PAD.  His ABIs today were in the normal range at 1.1 bilaterally with good waveforms and digit pressures.  His aortic duplex shows approximately 3.7 cm abdominal aortic aneurysm.  This has increased from 3.4 cm over a 2-year time span.  He is not having any clear aneurysm related symptoms. Specifically, the patient denies new back or abdominal pain, or signs of peripheral embolization   Current Outpatient Medications  Medication Sig Dispense Refill  . amLODipine-benazepril (LOTREL) 5-10 MG capsule TAKE 1 CAPSULE BY MOUTH DAILY 90 capsule 1  . budesonide (ENTOCORT EC) 3 MG 24 hr capsule     . calcium-vitamin D (OSCAL WITH D) 500-200 MG-UNIT per tablet Take 1 tablet by mouth daily with breakfast.    . etanercept (ENBREL) 50 MG/ML injection Inject 50 mg into the skin once a week.    . levothyroxine (SYNTHROID) 75 MCG tablet TAKE 1 TABLET DAILY BEFORE BREAKFAST **MFR MYLAN** 90 tablet 3  . lipase/protease/amylase (CREON) 36000 UNITS CPEP capsule Take 4 capsules by mouth 3 times a day before meals. Take up to 2 before all snacks 1620 capsule 3  . Omega-3 Fatty Acids (FISH OIL) 1000 MG CAPS Take 1 capsule by mouth daily.    . rivaroxaban (XARELTO) 20 MG TABS tablet Take 20 mg by mouth daily with supper.    . rosuvastatin (CRESTOR) 5 MG tablet Take 1 tablet by mouth daily.    . tamsulosin (FLOMAX) 0.4 MG CAPS capsule Take 1 capsule (0.4 mg total) by mouth daily. (Patient not taking: Reported on 03/17/2019) 90 capsule 3   No current facility-administered medications for this visit.     Past Medical History:  Diagnosis Date  . AAA (abdominal aortic aneurysm) ([place]) 12/16   4cm infrarenal  .  Atrial fibrillation ([place]) 11/14  . Collagen vascular disease ([place])   . Hypertension   . Pancreatic insufficiency   . Rectal cancer ([place]) 2000   At [facility] -stage 3. RT and chemo also  . Rheumatoid arthritis(714.0)   . Subclinical hypothyroidism 2014    Past Surgical History:  Procedure Laterality Date  . CATARACT EXTRACTION W/PHACO Right 02/14/2016   Procedure: CATARACT EXTRACTION PHACO AND INTRAOCULAR LENS PLACEMENT ([facility]);  Surgeon: Birder Robson, MD;  Location: [facility] ORS;  Service: Ophthalmology;  Laterality: Right;  [place] 01:02 AP% 18.7 CDE 11.59 Fluid pack lot # [id number]  . COLONOSCOPY    . COLONOSCOPY WITH PROPOFOL N/A 01/27/2015   Procedure: COLONOSCOPY WITH PROPOFOL;  Surgeon: Josefine Class, MD;  Location: [facility] ENDOSCOPY;  Service: Endoscopy;  Laterality: N/A;  . Right foot surgery Right 2/14   toes pinned  . TRANSANAL RECTAL RESECTION  [facility]   [facility]  . VARICOSE VEIN SURGERY  1979     Social History   Tobacco Use  . Smoking status: Former Research scientist (life sciences)  . Smokeless tobacco: Never Used  Substance Use Topics  . Alcohol use: No  . Drug use: No      Family History Family History  Problem Relation Age of Onset  . Liver cancer Mother   . Breast cancer Mother   . Heart disease Mother   . Heart disease Brother  CABG and valve repair  . Hypertension Brother   . Colon cancer Neg Hx   . Esophageal cancer Neg Hx   . Rectal cancer Neg Hx   . Stomach cancer Neg Hx   . Diabetes Neg Hx      No Known Allergies   REVIEW OF SYSTEMS (Negative unless checked)  Constitutional: [] Weight loss  [] Fever  [] Chills Cardiac: [] Chest pain   [] Chest pressure   [x] Palpitations   [] Shortness of breath when laying flat   [] Shortness of breath at rest   [] Shortness of breath with exertion. Vascular:  [] Pain in legs with walking   [] Pain in legs at rest   [] Pain in legs when laying flat   [] Claudication   [] Pain in feet when walking  [] Pain in feet at rest  [] Pain in feet when laying  flat   [] History of DVT   [] Phlebitis   [] Swelling in legs   [] Varicose veins   [] Non-healing ulcers Pulmonary:   [] Uses home oxygen   [] Productive cough   [] Hemoptysis   [] Wheeze  [] COPD   [] Asthma Neurologic:  [] Dizziness  [] Blackouts   [] Seizures   [] History of stroke   [] History of TIA  [] Aphasia   [] Temporary blindness   [] Dysphagia   [] Weakness or numbness in arms   [] Weakness or numbness in legs Musculoskeletal:  [x] Arthritis   [] Joint swelling   [] Joint pain   [] Low back pain Hematologic:  [] Easy bruising  [] Easy bleeding   [] Hypercoagulable state   [] Anemic   Gastrointestinal:  [] Blood in stool   [] Vomiting blood  [] Gastroesophageal reflux/heartburn   [] Abdominal pain Genitourinary:  [] Chronic kidney disease   [] Difficult urination  [] Frequent urination  [] Burning with urination   [] Hematuria Skin:  [] Rashes   [] Ulcers   [] Wounds Psychological:  [] History of anxiety   []  History of major depression.  Physical Examination  BP (!) 156/96 (BP Location: Right Arm)   Pulse 60   Resp 16   Wt 176 lb 3.2 oz (79.9 kg)   BMI 23.09 kg/m  Gen:  WD/WN, NAD Head: [place]/AT, No temporalis wasting. Ear/Nose/Throat: Hearing grossly intact, nares w/o erythema or drainage Eyes: Conjunctiva clear. Sclera non-icteric Neck: Supple.  Trachea midline Pulmonary:  Good air movement, no use of accessory muscles.  Cardiac: irregular Vascular:  Vessel Right Left  Radial Palpable Palpable                          PT Palpable Palpable  DP Palpable Palpable   Gastrointestinal: soft, non-tender/non-distended. Mild increase in aortic impulse Musculoskeletal: M/S 5/5 throughout.  No deformity or atrophy. No edema. Neurologic: Sensation grossly intact in extremities.  Symmetrical.  Speech is fluent.  Psychiatric: Judgment intact, Mood & affect appropriate for pt's clinical situation. Dermatologic: No rashes or ulcers noted.  No cellulitis or open wounds.       Labs No results found for this or any  previous visit (from the past 2160 hour(s)).  Radiology No results found.  Assessment/Plan Hyperlipidemia, mixed lipid control important in reducing the progression of atherosclerotic disease. Continue statin therapy   Essential hypertension blood pressure control important in reducing the progression of atherosclerotic disease. On appropriate oral medications.  Peripheral vascular obstructive disease (HCC) ABIs were normal with good digit pressures and waveforms.  No role for intervention.  Can be checked every few years.  AAA (abdominal aortic aneurysm) (HCC) 3.7 cm in maximal diameter.  Was 3.4 cm about 2 years ago.  No role  for intervention at this level.  Recommend annual follow-up until it reaches 4 cm in diameter at which time twice yearly checks would be recommended.  Avoidance of tobacco and blood pressure control are the most important things to limit growth.    Leotis Pain, MD  03/17/2019 9:51 AM    This note was created with Dragon medical transcription system.  Any errors from dictation are purely unintentional

## 2019-03-17 NOTE — Patient Instructions (Signed)
Abdominal Aortic Aneurysm  An aneurysm is a bulge in one of the blood vessels that carry blood away from the heart (artery). It happens when blood pushes up against a weak or damaged place in the wall of an artery. An abdominal aortic aneurysm happens in the main artery of the body (aorta). Some aneurysms may not cause problems. If it grows, it can burst or tear, causing bleeding inside the body. This is an emergency. It needs to be treated right away. What are the causes? The exact cause of this condition is not known. What increases the risk? The following may make you more likely to get this condition:  Being a male who is 78 years of age or older.  Being white (Caucasian).  Using tobacco.  Having a family history of aneurysms.  Having the following conditions: ? Hardening of the arteries (arteriosclerosis). ? Inflammation of the walls of an artery (arteritis). ? Certain genetic conditions. ? Being very overweight (obesity). ? An infection in the wall of the aorta (infectious aortitis). ? High cholesterol. ? High blood pressure (hypertension). What are the signs or symptoms? Symptoms depend on the size of the aneurysm and how fast it is growing. Most grow slowly and do not cause any symptoms. If symptoms do occur, they may include:  Pain in the belly (abdomen), side, or back.  Feeling full after eating only small amounts of food.  Feeling a throbbing lump in the belly. Symptoms that the aneurysm has burst (ruptured) include:  Sudden, very bad pain in the belly, side, or back.  Feeling sick to your stomach (nauseous).  Throwing up (vomiting).  Feeling light-headed or passing out. How is this treated? Treatment for this condition depends on:  The size of the aneurysm.  How fast it is growing.  Your age.  Your risk of having it burst. If your aneurysm is smaller than 2 inches (5 cm), your doctor may manage it by:  Checking it often to see if it is getting bigger.  You may have an imaging test (ultrasound) to check it every 3-6 months, every year, or every few years.  Giving you medicines to: ? Control blood pressure. ? Treat pain. ? Fight infection. If your aneurysm is larger than 2 inches (5 cm), you may need surgery to fix it. Follow these instructions at home: Lifestyle  Do not use any products that have nicotine or tobacco in them. This includes cigarettes, e-cigarettes, and chewing tobacco. If you need help quitting, ask your doctor.  Get regular exercise. Ask your doctor what types of exercise are best for you. Eating and drinking  Eat a heart-healthy diet. This includes eating plenty of: ? Fresh fruits and vegetables. ? Whole grains. ? Low-fat (lean) protein. ? Low-fat dairy products.  Avoid foods that are high in saturated fat and cholesterol. These foods include red meat and some dairy products.  Do not drink alcohol if: ? Your doctor tells you not to drink. ? You are pregnant, may be pregnant, or are planning to become pregnant.  If you drink alcohol: ? Limit how much you use to:  0-1 drink a day for women.  0-2 drinks a day for men. ? Be aware of how much alcohol is in your drink. In the [place] one drink equals any of these:  One typical bottle of beer (12 oz).  One-half glass of wine (5 oz).  One shot of hard liquor (1 oz). General instructions  Take over-the-counter and prescription medicines only as   told by your doctor.  Keep your blood pressure within normal limits. Ask your doctor what your blood pressure should be.  Have your blood sugar (glucose) level and cholesterol levels checked regularly. Keep your blood sugar level and cholesterol levels within normal limits.  Avoid heavy lifting and activities that take a lot of effort. Ask your doctor what activities are safe for you.  Keep all follow-up visits as told by your doctor. This is important. ? Talk to your doctor about regular screenings to see if the  aneurysm is getting bigger. Contact a doctor if you:  Have pain in your belly, side, or back.  Have a throbbing feeling in your belly.  Have a family history of aneurysms. Get help right away if you:  Have sudden, bad pain in your belly, side, or back.  Feel sick to your stomach.  Throw up.  Have trouble pooping (constipation).  Have trouble peeing (urinating).  Feel light-headed.  Have a fast heart rate when you stand.  Have sweaty skin that is cold to the touch (clammy).  Have shortness of breath.  Have a fever. These symptoms may be an emergency. Do not wait to see if the symptoms will go away. Get medical help right away. Call your local emergency services (911 in the [place] Do not drive yourself to the hospital. Summary  An aneurysm is a bulge in one of the blood vessels that carry blood away from the heart (artery). Some aneurysms may not cause problems.  You may need to have yours checked often. If it grows, it can burst or tear. This causes bleeding inside the body. It needs to be treated right away.  Follow instructions from your doctor about healthy lifestyle changes.  Keep all follow-up visits as told by your doctor. This is important. This information is not intended to replace advice given to you by your health care provider. Make sure you discuss any questions you have with your health care provider. Document Released: 09/15/2012 Document Revised: 09/08/2018 Document Reviewed: 12/28/2017 Elsevier Patient Education  2020 Elsevier Inc.  

## 2019-03-17 NOTE — Assessment & Plan Note (Signed)
3.7 cm in maximal diameter.  Was 3.4 cm about 2 years ago.  No role for intervention at this level.  Recommend annual follow-up until it reaches 4 cm in diameter at which time twice yearly checks would be recommended.  Avoidance of tobacco and blood pressure control are the most important things to limit growth.

## 2019-03-17 NOTE — Assessment & Plan Note (Signed)
ABIs were normal with good digit pressures and waveforms.  No role for intervention.  Can be checked every few years.

## 2019-04-22 ENCOUNTER — Other Ambulatory Visit: Payer: Self-pay | Admitting: Internal Medicine

## 2019-05-06 DIAGNOSIS — N5201 Erectile dysfunction due to arterial insufficiency: Secondary | ICD-10-CM | POA: Insufficient documentation

## 2019-05-06 DIAGNOSIS — N489 Disorder of penis, unspecified: Secondary | ICD-10-CM | POA: Insufficient documentation

## 2019-05-06 HISTORY — DX: Erectile dysfunction due to arterial insufficiency: N52.01

## 2019-05-06 HISTORY — DX: Disorder of penis, unspecified: N48.9

## 2019-07-16 ENCOUNTER — Other Ambulatory Visit: Payer: Self-pay | Admitting: Internal Medicine

## 2020-01-08 DIAGNOSIS — I4589 Other specified conduction disorders: Secondary | ICD-10-CM

## 2020-01-08 HISTORY — DX: Other specified conduction disorders: I45.89

## 2020-02-16 ENCOUNTER — Encounter: Payer: Federal, State, Local not specified - PPO | Admitting: Internal Medicine

## 2020-03-18 ENCOUNTER — Encounter (INDEPENDENT_AMBULATORY_CARE_PROVIDER_SITE_OTHER): Payer: Self-pay | Admitting: Nurse Practitioner

## 2020-03-18 ENCOUNTER — Other Ambulatory Visit: Payer: Self-pay

## 2020-03-18 ENCOUNTER — Other Ambulatory Visit (INDEPENDENT_AMBULATORY_CARE_PROVIDER_SITE_OTHER): Payer: Self-pay | Admitting: Nurse Practitioner

## 2020-03-18 ENCOUNTER — Ambulatory Visit (INDEPENDENT_AMBULATORY_CARE_PROVIDER_SITE_OTHER): Payer: Federal, State, Local not specified - PPO

## 2020-03-18 ENCOUNTER — Ambulatory Visit (INDEPENDENT_AMBULATORY_CARE_PROVIDER_SITE_OTHER): Payer: Federal, State, Local not specified - PPO | Admitting: Nurse Practitioner

## 2020-03-18 VITALS — BP 166/73 | HR 73 | Resp 17 | Ht 75.0 in | Wt 173.0 lb

## 2020-03-18 DIAGNOSIS — I714 Abdominal aortic aneurysm, without rupture, unspecified: Secondary | ICD-10-CM

## 2020-03-18 DIAGNOSIS — E782 Mixed hyperlipidemia: Secondary | ICD-10-CM | POA: Diagnosis not present

## 2020-03-18 DIAGNOSIS — I10 Essential (primary) hypertension: Secondary | ICD-10-CM

## 2020-03-18 NOTE — Progress Notes (Signed)
Subjective:    Patient ID: Damon Torres, male    DOB: 01-15-1941, 79 y.o.   MRN: [id number] Chief Complaint  Patient presents with  . Follow-up    ultrasound    The patient returns to the office for surveillance of a known abdominal aortic aneurysm. Patient denies abdominal pain or back pain, no other abdominal complaints. No changes suggesting embolic episodes.   There have been no interval changes in the patient's overall health care since his last visit.  Patient denies amaurosis fugax or TIA symptoms. There is no history of claudication or rest pain symptoms of the lower extremities. The patient denies angina or shortness of breath.   Duplex US of the aorta and iliac arteries shows an AAA measured 4.2 cm with a left common iliac artery aneurysms.  Previous abdominal aortic duplex shows abdominal aortic measurements at 3.7 cm on 03/17/2019.   Review of Systems  Cardiovascular: Negative for chest pain.  Hematological: Bruises/bleeds easily.  All other systems reviewed and are negative.      Objective:   Physical Exam Vitals reviewed.  HENT:     Head: Normocephalic.  Cardiovascular:     Rate and Rhythm: Normal rate.     Pulses: Normal pulses.  Pulmonary:     Effort: Pulmonary effort is normal.  Musculoskeletal:        General: Normal range of motion.  Skin:    General: Skin is warm and dry.  Neurological:     Mental Status: He is alert and oriented to person, place, and time.  Psychiatric:        Mood and Affect: Mood normal.        Behavior: Behavior normal.        Thought Content: Thought content normal.        Judgment: Judgment normal.     BP (!) 166/73 (BP Location: Right Arm)   Pulse 73   Resp 17   Ht 6\' 3"  (1.905 m)   Wt 173 lb (78.5 kg)   BMI 21.62 kg/m   Past Medical History:  Diagnosis Date  . AAA (abdominal aortic aneurysm) ([place]) 12/16   4cm infrarenal  . Atrial fibrillation ([place]) 11/14  . Collagen vascular disease ([place])   . Hypertension    . Pancreatic insufficiency   . Rectal cancer ([place]) 2000   At [facility] -stage 3. RT and chemo also  . Rheumatoid arthritis(714.0)   . Subclinical hypothyroidism 2014    Social History   Socioeconomic History  . Marital status: Widowed    Spouse name: Not on file  . Number of children: 2  . Years of education: Not on file  . Highest education level: Not on file  Occupational History  . 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 Smoking status: Former Research scientist (life sciences)  . Smokeless tobacco: Never Used  Substance and Sexual Activity  . Alcohol use: No  . Drug use: No  . Sexual activity: Not on file  Other Topics Concern  . Not on file  Social History Narrative   Widowed 5/20   Now has living will   Daughter is health care POA   Would accept resuscitation attempts   Would not want tube feeds if cognitively unaware   Social Determinants of Health   Financial Resource Strain:   . Difficulty of Paying Living Expenses: Not on file  Food Insecurity:   . Worried About  Running Out of Food in the Last Year: Not on file  . Ran Out of Food in the Last Year: Not on file  Transportation Needs:   . Lack of Transportation (Medical): Not on file  . Lack of Transportation (Non-Medical): Not on file  Physical Activity:   . Days of Exercise per Week: Not on file  . Minutes of Exercise per Session: Not on file  Stress:   . Feeling of Stress : Not on file  Social Connections:   . Frequency of Communication with Friends and Family: Not on file  . Frequency of Social Gatherings with Friends and Family: Not on file  . Attends Religious Services: Not on file  . Active Member of Clubs or Organizations: Not on file  . Attends Archivist Meetings: Not on file  . Marital Status: Not on file  Intimate Partner Violence:   . Fear of Current or Ex-Partner: Not on file  . Emotionally Abused: Not on file  . Physically  Abused: Not on file  . Sexually Abused: Not on file    Past Surgical History:  Procedure Laterality Date  . CATARACT EXTRACTION W/PHACO Right 02/14/2016   Procedure: CATARACT EXTRACTION PHACO AND INTRAOCULAR LENS PLACEMENT (IOC);  Surgeon: Birder Robson, MD;  Location: [facility] ORS;  Service: Ophthalmology;  Laterality: Right;  [place] 01:02 AP% 18.7 CDE 11.59 Fluid pack lot # [id number]  . COLONOSCOPY    . COLONOSCOPY WITH PROPOFOL N/A 01/27/2015   Procedure: COLONOSCOPY WITH PROPOFOL;  Surgeon: Josefine Class, MD;  Location: [facility] ENDOSCOPY;  Service: Endoscopy;  Laterality: N/A;  . Right foot surgery Right 2/14   toes pinned  . TRANSANAL RECTAL RESECTION  2000   [place]  . VARICOSE VEIN SURGERY  1979    Family History  Problem Relation Age of Onset  . Liver cancer Mother   . Breast cancer Mother   . Heart disease Mother   . Heart disease Brother        CABG and valve repair  . Hypertension Brother   . Colon cancer Neg Hx   . Esophageal cancer Neg Hx   . Rectal cancer Neg Hx   . Stomach cancer Neg Hx   . Diabetes Neg Hx     No Known Allergies     Assessment & Plan:   1. Abdominal aortic aneurysm (AAA) without rupture ([place]) No surgery or intervention at this time. The patient has an asymptomatic abdominal aortic aneurysm that is less than 4 cm in maximal diameter.  I have discussed the natural history of abdominal aortic aneurysm and the small risk of rupture for aneurysm less than 5 cm in size.  However, as these small aneurysms tend to enlarge over time, continued surveillance with ultrasound or CT scan is mandatory.  I have also discussed optimizing medical management with hypertension and lipid control and the importance of abstinence from tobacco.  The patient is also encouraged to exercise a minimum of 30 minutes 4 times a week.  Should the patient develop new onset abdominal or back pain or signs of peripheral embolization they are instructed to seek medical attention  immediately and to alert the physician providing care that they have an aneurysm.  The patient voices their understanding. The patient will return in 12 months with an aortic duplex.   2. Hyperlipidemia, mixed Continue statin as ordered and reviewed, no changes at this time   3. Essential hypertension Continue antihypertensive medications as already ordered, these medications have been reviewed  and there are no changes at this time.    Current Outpatient Medications on File Prior to Visit  Medication Sig Dispense Refill  . amLODipine-benazepril (LOTREL) 5-10 MG capsule TAKE 1 CAPSULE BY MOUTH DAILY 90 capsule 3  . budesonide (ENTOCORT EC) 3 MG 24 hr capsule     . calcium-vitamin D (OSCAL WITH D) 500-200 MG-UNIT per tablet Take 1 tablet by mouth daily with breakfast.    . CREON 36000 units CPEP capsule TAKE 4 CAPSULES 3 TIMES A  DAY BEFORE MEALS. TAKE UP  TO 2 CAPSULES BEFORE ALL   SNACKS 1620 capsule 3  . etanercept (ENBREL) 50 MG/ML injection Inject 50 mg into the skin once a week.    [name] ketoconazole (NIZORAL) 2 % shampoo Apply topically 2 (two) times a week.    . levothyroxine (SYNTHROID) 75 MCG tablet TAKE 1 TABLET DAILY BEFORE BREAKFAST **MFR MYLAN** 90 tablet 3  . Omega-3 Fatty Acids (FISH OIL) 1000 MG CAPS Take 1 capsule by mouth daily.    . rivaroxaban (XARELTO) 20 MG TABS tablet Take 20 mg by mouth daily with supper.    . rosuvastatin (CRESTOR) 5 MG tablet Take 1 tablet by mouth daily. (Patient not taking: Reported on 03/18/2020)    . tamsulosin (FLOMAX) 0.4 MG CAPS capsule Take 1 capsule (0.4 mg total) by mouth daily. (Patient not taking: Reported on 03/17/2019) 90 capsule 3   No current facility-administered medications on file prior to visit.    There are no Patient Instructions on file for this visit. No follow-ups on file.   Kris Hartmann, NP

## 2020-03-31 ENCOUNTER — Other Ambulatory Visit: Payer: Self-pay | Admitting: Internal Medicine

## 2020-04-07 ENCOUNTER — Other Ambulatory Visit: Payer: Self-pay

## 2020-04-07 ENCOUNTER — Ambulatory Visit: Payer: Federal, State, Local not specified - PPO | Admitting: Dermatology

## 2020-04-07 DIAGNOSIS — L820 Inflamed seborrheic keratosis: Secondary | ICD-10-CM | POA: Diagnosis not present

## 2020-04-07 DIAGNOSIS — L578 Other skin changes due to chronic exposure to nonionizing radiation: Secondary | ICD-10-CM | POA: Diagnosis not present

## 2020-04-07 DIAGNOSIS — L719 Rosacea, unspecified: Secondary | ICD-10-CM

## 2020-04-07 DIAGNOSIS — L739 Follicular disorder, unspecified: Secondary | ICD-10-CM

## 2020-04-07 MED ORDER — MUPIROCIN 2 % EX OINT: TOPICAL_OINTMENT | CUTANEOUS | 0 refills | Status: DC

## 2020-04-07 MED ORDER — DOXYCYCLINE HYCLATE 100 MG PO TABS: ORAL_TABLET | ORAL | 1 refills | Status: DC

## 2020-04-07 NOTE — Patient Instructions (Signed)
Doxycycline should be taken with food to prevent nausea. Do not lay down for 30 minutes after taking. Be cautious with sun exposure and use good sun protection while on this medication. Pregnant women should not take this medication.  

## 2020-04-07 NOTE — Progress Notes (Signed)
   Follow-Up Visit   Subjective  Damon Torres is a 79 y.o. male who presents for the following: soreness (inside and around L nose - was rx'ed Mupirocin 2% ointment by [facility] about 3-4 years ago which has helped up until now). Other skin lesions that are irritated and patient would like treated on the R shoulder, R nose, and forehead.   The following portions of the chart were reviewed this encounter and updated as appropriate:  Tobacco  Allergies  Meds  Problems  Med Hx  Surg Hx  Fam Hx     Review of Systems:  No other skin or systemic complaints except as noted in HPI or Assessment and Plan.  Objective  Well appearing patient in no apparent distress; mood and affect are within normal limits.  A focused examination was performed including the face and trunk. Relevant physical exam findings are noted in the Assessment and Plan.  Objective  R lat base of neck x 1, R nasal bridge x 1, L lat forehead x 1, R zygoma x 1 (4): Erythematous keratotic or waxy stuck-on papule or plaque.   Objective  Nose: Pink papules on the nose and nasal tip   Assessment & Plan  Inflamed seborrheic keratosis (4) R lat base of neck x 1, R nasal bridge x 1, L lat forehead x 1, R zygoma x 1  Destruction of lesion - R lat base of neck x 1, R nasal bridge x 1, L lat forehead x 1, R zygoma x 1 Complexity: simple   Destruction method: cryotherapy   Informed consent: discussed and consent obtained   Timeout:  patient name, date of birth, surgical site, and procedure verified Lesion destroyed using liquid nitrogen: Yes   Region frozen until ice ball extended beyond lesion: Yes   Outcome: patient tolerated procedure well with no complications   Post-procedure details: wound care instructions given    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 Start Doxycycline 100mg  po QD with food x 2 wks. #30 1RF. Doxycycline should be taken  with food to prevent nausea. Do not lay down for 30 minutes after taking. Be cautious with sun exposure and use good sun protection while on this medication. Pregnant women should not take this medication.   Advised there is no evidence of skin cancer there today.  Continue Mupirocin 2% ointment to aa's daily until clear.   doxycycline (VIBRA-TABS) 100 MG tablet - Nose  mupirocin ointment (BACTROBAN) 2 % - Nose  Actinic Damage - chronic, secondary to cumulative UV radiation exposure/sun exposure over time - diffuse scaly erythematous macules with underlying dyspigmentation - Recommend daily broad spectrum sunscreen SPF 30+ to sun-exposed areas, reapply every 2 hours as needed.  - Call for new or changing lesions.  Return in about 3 months (around 07/08/2020) for follow up .  Luther Redo, CMA, am acting as scribe for Sarina Ser, MD . Documentation: I have reviewed the above documentation for accuracy and completeness, and I agree with the above.  Sarina Ser, MD

## 2020-04-08 ENCOUNTER — Encounter: Payer: Self-pay | Admitting: Dermatology

## 2020-04-16 ENCOUNTER — Other Ambulatory Visit: Payer: Self-pay | Admitting: Internal Medicine

## 2020-05-19 ENCOUNTER — Other Ambulatory Visit: Payer: Self-pay

## 2020-05-19 ENCOUNTER — Encounter: Payer: Self-pay | Admitting: Internal Medicine

## 2020-05-19 ENCOUNTER — Ambulatory Visit (INDEPENDENT_AMBULATORY_CARE_PROVIDER_SITE_OTHER): Payer: Federal, State, Local not specified - PPO | Admitting: Internal Medicine

## 2020-05-19 VITALS — BP 124/64 | HR 49 | Temp 97.8°F | Ht 73.0 in | Wt 177.0 lb

## 2020-05-19 DIAGNOSIS — D6869 Other thrombophilia: Secondary | ICD-10-CM

## 2020-05-19 DIAGNOSIS — M069 Rheumatoid arthritis, unspecified: Secondary | ICD-10-CM | POA: Diagnosis not present

## 2020-05-19 DIAGNOSIS — I4811 Longstanding persistent atrial fibrillation: Secondary | ICD-10-CM

## 2020-05-19 DIAGNOSIS — I10 Essential (primary) hypertension: Secondary | ICD-10-CM

## 2020-05-19 DIAGNOSIS — N1832 Chronic kidney disease, stage 3b: Secondary | ICD-10-CM | POA: Diagnosis not present

## 2020-05-19 DIAGNOSIS — Z0000 Encounter for general adult medical examination without abnormal findings: Secondary | ICD-10-CM | POA: Diagnosis not present

## 2020-05-19 DIAGNOSIS — Z7189 Other specified counseling: Secondary | ICD-10-CM

## 2020-05-19 DIAGNOSIS — E039 Hypothyroidism, unspecified: Secondary | ICD-10-CM

## 2020-05-19 HISTORY — DX: Other thrombophilia: D68.69

## 2020-05-19 NOTE — Assessment & Plan Note (Signed)
Fairly stable No action for now

## 2020-05-19 NOTE — Assessment & Plan Note (Signed)
I have personally reviewed the Medicare Annual Wellness questionnaire and have noted 1. The patient's medical and social history 2. Their use of alcohol, tobacco or illicit drugs 3. Their current medications and supplements 4. The patient's functional ability including ADL's, fall risks, home safety risks and hearing or visual             impairment. 5. Diet and physical activities 6. Evidence for depression or mood disorders  The patients weight, height, BMI and visual acuity have been recorded in the chart I have made referrals, counseling and provided education to the patient based review of the above and I have provided the pt with a written personalized care plan for preventive services.  I have provided you with a copy of your personalized plan for preventive services. Please take the time to review along with your updated medication list.  No cancer screening now Had COVID booster and flu vaccine Can consider shingrix--not a live vaccine Trying to exercise regularly

## 2020-05-19 NOTE — Assessment & Plan Note (Signed)
Rate is fine On xarelto

## 2020-05-19 NOTE — Progress Notes (Signed)
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 Declines hearing aids  Vision Screening Comments: October 2021

## 2020-05-19 NOTE — Assessment & Plan Note (Signed)
Disturbed by his easy bruising---but mostly on arms

## 2020-05-19 NOTE — Assessment & Plan Note (Signed)
Labs just fine at [facility]

## 2020-05-19 NOTE — Assessment & Plan Note (Signed)
Doing well on enbrel now Followed at [facility]

## 2020-05-19 NOTE — Assessment & Plan Note (Signed)
See social history 

## 2020-05-19 NOTE — Progress Notes (Signed)
Subjective:    Patient ID: Damon Torres, male    DOB: [birth year], 79 y.o.   MRN: [id number]  HPI Here for Medicare wellness visit and follow up of chronic health conditions This visit occurred during the SARS-CoV-2 public health emergency.  Safety protocols were in place, including screening questions prior to the visit, additional usage of staff PPE, and extensive cleaning of exam room while observing appropriate contact time as indicated for disinfecting solutions.   Reviewed form and advanced directives Reviewed other doctors No alcohol or tobacco Regular exercise in a class Vision is okay Hearing not so good. Doesn't want hearing aides. No falls No depression or anhedonia Independent with instrumental ADLs No apparent memory issues  Has some problems with colitis Is on the budesonide Also creon (pancreatic replacement)  RA controlled with enbrel Left hand/wrist and right shoulder mostly  Stays in atrial fibrillation all the time No symptoms from this No chest pain or SOB No dizziness or syncope Some edema in evening--better in AM. No pain (wears support socks)  Aortic aneurysm has increased in size some Still only 4.3cm--yearly checks with vascular Known peripheral vascular disease--no claudication  Recent GFR 45  Current Outpatient Medications on File Prior to Visit  Medication Sig Dispense Refill  . amLODipine-benazepril (LOTREL) 5-10 MG capsule TAKE 1 CAPSULE BY MOUTH DAILY 90 capsule 0  . budesonide (ENTOCORT EC) 3 MG 24 hr capsule     . calcium-vitamin D (OSCAL WITH D) 500-200 MG-UNIT per tablet Take 1 tablet by mouth daily with breakfast.    . CREON 36000 units CPEP capsule TAKE 4 CAPSULES 3 TIMES A  DAY BEFORE MEALS. TAKE UP  TO 2 CAPSULES BEFORE ALL   SNACKS 1620 capsule 3  . etanercept (ENBREL) 50 MG/ML injection Inject 50 mg into the skin once a week.    [name] ketoconazole (NIZORAL) 2 % shampoo Apply topically 2 (two) times a week.    . levothyroxine  (SYNTHROID) 75 MCG tablet TAKE 1 TABLET DAILY BEFORE BREAKFAST **MFR MYLAN** 90 tablet 0  . Omega-3 Fatty Acids (FISH OIL) 1000 MG CAPS Take 1 capsule by mouth daily.    . rivaroxaban (XARELTO) 20 MG TABS tablet Take 20 mg by mouth daily with supper.     No current facility-administered medications on file prior to visit.    No Known Allergies  Past Medical History:  Diagnosis Date  . AAA (abdominal aortic aneurysm) ([place]) 12/16   4cm infrarenal  . Atrial fibrillation ([place]) 11/14  . Collagen vascular disease ([place])   . Hypertension   . Pancreatic insufficiency   . Rectal cancer ([place]) 2000   At [facility] -stage 3. RT and chemo also  . Rheumatoid arthritis(714.0)   . Subclinical hypothyroidism 2014    Past Surgical History:  Procedure Laterality Date  . CATARACT EXTRACTION W/PHACO Right 02/14/2016   Procedure: CATARACT EXTRACTION PHACO AND INTRAOCULAR LENS PLACEMENT (IOC);  Surgeon: Birder Robson, MD;  Location: [facility] ORS;  Service: Ophthalmology;  Laterality: Right;  [place] 01:02 AP% 18.7 CDE 11.59 Fluid pack lot # [id number] H  . COLONOSCOPY    . COLONOSCOPY WITH PROPOFOL N/A 01/27/2015   Procedure: COLONOSCOPY WITH PROPOFOL;  Surgeon: Josefine Class, MD;  Location: [facility] ENDOSCOPY;  Service: Endoscopy;  Laterality: N/A;  . Right foot surgery Right 2/14   toes pinned  . TRANSANAL RECTAL RESECTION  2000   [facility]  . VARICOSE VEIN SURGERY  1979    Family History  Problem Relation Age of Onset  .  Liver cancer Mother   . Breast cancer Mother   . Heart disease Mother   . Heart disease Brother        CABG and valve repair  . Hypertension Brother   . Colon cancer Neg Hx   . Esophageal cancer Neg Hx   . Rectal cancer Neg Hx   . Stomach cancer Neg Hx   . Diabetes Neg Hx     Social History   Socioeconomic History  . Marital status: Widowed    Spouse name: Not on file  . Number of children: 2  . Years of education: Not on file  . Highest education level: Not on file   Occupational History  . 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 Smoking status: Former Research scientist (life sciences)  . Smokeless tobacco: Never Used  Substance and Sexual Activity  . Alcohol use: No  . Drug use: No  . Sexual activity: Not on file  Other Topics Concern  . Not on file  Social History Narrative   Widowed 5/20   Now has living will   Daughter is health care POA   Would accept resuscitation attempts   Would not want tube feeds if cognitively unaware   Social Determinants of Health   Financial Resource Strain: Not on file  Food Insecurity: Not on file  Transportation Needs: Not on file  Physical Activity: Not on file  Stress: Not on file  Social Connections: Not on file  Intimate Partner Violence: Not on file   Review of Systems Appetite is okay Careful with eating due to colitis Bruising very easily. No suspicious lesions now Sleeps okay Wears seat belt Teeth fine--keeps up with dentist No heartburn or dysphagia No sig back or joint pains now    Objective:   Physical Exam Constitutional:      Appearance: Normal appearance.  HENT:     Mouth/Throat:     Comments: No lesions Full upper Eyes:     Conjunctiva/sclera: Conjunctivae normal.     Pupils: Pupils are equal, round, and reactive to light.  Cardiovascular:     Rate and Rhythm: Normal rate. Rhythm irregular.     Heart sounds: No murmur heard. No gallop.      Comments: Faint pedal pulses Pulmonary:     Effort: Pulmonary effort is normal.     Breath sounds: Normal breath sounds. No wheezing or rales.  Abdominal:     Palpations: Abdomen is soft.     Tenderness: There is no abdominal tenderness.  Musculoskeletal:     Cervical back: Neck supple.     Comments: 1+  Edema in ankles  Lymphadenopathy:     Cervical: No cervical adenopathy.  Skin:    General: Skin is warm.     Findings: No rash.  Neurological:     Mental Status: He is  alert and oriented to person, place, and time.     Comments: President--"Joe Biden, Trump, Obama" [id number] Recall---- 3/3  Psychiatric:        Mood and Affect: Mood normal.        Behavior: Behavior normal.            Assessment & Plan:

## 2020-05-19 NOTE — Assessment & Plan Note (Signed)
BP Readings from Last 3 Encounters:  05/19/20 124/64  03/18/20 (!) 166/73  03/17/19 (!) 156/96   Okay on amlodipine/benazepril Labs okay

## 2020-07-15 ENCOUNTER — Other Ambulatory Visit: Payer: Self-pay | Admitting: Internal Medicine

## 2020-07-18 ENCOUNTER — Ambulatory Visit: Payer: Federal, State, Local not specified - PPO | Admitting: Dermatology

## 2020-07-18 ENCOUNTER — Other Ambulatory Visit: Payer: Self-pay

## 2020-07-18 DIAGNOSIS — L570 Actinic keratosis: Secondary | ICD-10-CM | POA: Diagnosis not present

## 2020-07-18 DIAGNOSIS — L820 Inflamed seborrheic keratosis: Secondary | ICD-10-CM | POA: Diagnosis not present

## 2020-07-18 DIAGNOSIS — L719 Rosacea, unspecified: Secondary | ICD-10-CM | POA: Diagnosis not present

## 2020-07-18 DIAGNOSIS — L578 Other skin changes due to chronic exposure to nonionizing radiation: Secondary | ICD-10-CM | POA: Diagnosis not present

## 2020-07-18 DIAGNOSIS — D692 Other nonthrombocytopenic purpura: Secondary | ICD-10-CM | POA: Diagnosis not present

## 2020-07-18 NOTE — Progress Notes (Unsigned)
   Follow-Up Visit   Subjective  Damon Torres is a 80 y.o. male who presents for the following: Follow-up (Patient here today for 3 month follow up for isk on right lateral base of neck, right nasal bridge, left lateral forehead and right zygoma.  He also would like to discuss treatment options to help with thin skin. ).  The following portions of the chart were reviewed this encounter and updated as appropriate:  Tobacco  Allergies  Meds  Problems  Med Hx  Surg Hx  Fam Hx      Objective  Well appearing patient in no apparent distress; mood and affect are within normal limits.  A focused examination was performed including face, bilateral ears,bilateral arms. Relevant physical exam findings are noted in the Assessment and Plan.  Objective  right ear helix x 1: Erythematous thin papules/macules with gritty scale.   Objective  Left superior helix x 1: Erythematous keratotic or waxy stuck-on papule or plaque.   Objective  Nose: Clear   Assessment & Plan   Actinic keratosis right ear helix x 1 Prior to procedure, discussed risks of blister formation, small wound, skin dyspigmentation, or rare scar following cryotherapy.  Destruction of lesion - right ear helix x 1 Complexity: simple   Destruction method: cryotherapy   Informed consent: discussed and consent obtained   Timeout:  patient name, date of birth, surgical site, and procedure verified Lesion destroyed using liquid nitrogen: Yes   Region frozen until ice ball extended beyond lesion: Yes   Outcome: patient tolerated procedure well with no complications   Post-procedure details: wound care instructions given    Inflamed seborrheic keratosis Left superior helix x 1 Prior to procedure, discussed risks of blister formation, small wound, skin dyspigmentation, or rare scar following cryotherapy.  Destruction of lesion - Left superior helix x 1 Complexity: simple   Destruction method: cryotherapy   Informed  consent: discussed and consent obtained   Timeout:  patient name, date of birth, surgical site, and procedure verified Lesion destroyed using liquid nitrogen: Yes   Region frozen until ice ball extended beyond lesion: Yes   Outcome: patient tolerated procedure well with no complications   Post-procedure details: wound care instructions given    Rosacea Nose Clear today - no treatment needed    Seborrheic Keratoses Left forehead - patient deferred treatment - Stuck-on, waxy, tan-brown papules and plaques  - Discussed benign etiology and prognosis. - Observe - Call for any changes  Purpura with thin skin- Chronic; persistent and recurrent.  Treatable, but not curable. With thin skin on bilateral arms  - Violaceous macules and patches - Benign - Related to trauma, age, sun damage and/or use of blood thinners, chronic use of topical and/or oral steroids - Observe - Can use OTC arnica containing moisturizer such as Dermend Bruise Formula if desired and arnica  - Call for worsening or other concerns  Actinic Damage - chronic, secondary to cumulative UV radiation exposure/sun exposure over time - diffuse scaly erythematous macules with underlying dyspigmentation - Recommend daily broad spectrum sunscreen SPF 30+ to sun-exposed areas, reapply every 2 hours as needed.  - Call for new or changing lesions.  Return in about 1 year (around 07/18/2021) for tbse . IRuthell Rummage, CMA, am acting as scribe for Sarina Ser, MD.  Documentation: I have reviewed the above documentation for accuracy and completeness, and I agree with the above.  Sarina Ser, MD

## 2020-07-18 NOTE — Patient Instructions (Signed)
Cryotherapy Aftercare  . Wash gently with soap and water everyday.   . Apply Vaseline and Band-Aid daily until healed.  

## 2020-07-19 ENCOUNTER — Encounter: Payer: Self-pay | Admitting: Dermatology

## 2020-08-17 ENCOUNTER — Other Ambulatory Visit: Payer: Self-pay | Admitting: Internal Medicine

## 2020-08-20 ENCOUNTER — Other Ambulatory Visit: Payer: Self-pay | Admitting: Internal Medicine

## 2020-11-06 DIAGNOSIS — R198 Other specified symptoms and signs involving the digestive system and abdomen: Secondary | ICD-10-CM

## 2020-11-09 NOTE — Telephone Encounter (Signed)
I have created the referral.

## 2020-12-15 ENCOUNTER — Telehealth: Payer: Self-pay

## 2020-12-15 NOTE — Telephone Encounter (Signed)
Patient states he has really thin skin and because of that skin gets bruised and irritated easily. Patient states he has had trouble x months with his elbows been sore and bruised and the skin wont heal. He has applied wrapping/bandage to the area but it is not helping and hurting.  We do not have any appointments today, can patient be worked in or seen tomorrow?

## 2020-12-15 NOTE — Telephone Encounter (Signed)
Spoke to pt. Made appt for 11:15 tomorrow.

## 2020-12-16 ENCOUNTER — Ambulatory Visit: Payer: Federal, State, Local not specified - PPO | Admitting: Internal Medicine

## 2020-12-16 ENCOUNTER — Encounter: Payer: Self-pay | Admitting: Internal Medicine

## 2020-12-16 ENCOUNTER — Other Ambulatory Visit: Payer: Self-pay

## 2020-12-16 DIAGNOSIS — M25522 Pain in left elbow: Secondary | ICD-10-CM

## 2020-12-16 DIAGNOSIS — N401 Enlarged prostate with lower urinary tract symptoms: Secondary | ICD-10-CM

## 2020-12-16 DIAGNOSIS — M069 Rheumatoid arthritis, unspecified: Secondary | ICD-10-CM | POA: Diagnosis not present

## 2020-12-16 DIAGNOSIS — M25521 Pain in right elbow: Secondary | ICD-10-CM | POA: Insufficient documentation

## 2020-12-16 DIAGNOSIS — D6869 Other thrombophilia: Secondary | ICD-10-CM

## 2020-12-16 DIAGNOSIS — N138 Other obstructive and reflux uropathy: Secondary | ICD-10-CM

## 2020-12-16 HISTORY — DX: Pain in right elbow: M25.521

## 2020-12-16 MED ORDER — TAMSULOSIN HCL 0.4 MG PO CAPS: 0.4000 mg | ORAL_CAPSULE | Freq: Every day | ORAL | 3 refills | Status: DC

## 2020-12-16 NOTE — Assessment & Plan Note (Signed)
Appears to have chronic hyperpigmentation--not bruising I suspect the pain is just arthritic---but no active synovitis Discussed trying heat or lidocaine topically Could use diclofenac sparingly (CKD)

## 2020-12-16 NOTE — Assessment & Plan Note (Signed)
Still with easy bruising that seems consistent with his xarelto use

## 2020-12-16 NOTE — Patient Instructions (Signed)
Please try a heat rub on your elbows---- and/or topical lidocaine. If that is not helping--you can try over the counter diclofenac gel once or twice a day (try to limit due to chance of it affecting your kidneys)

## 2020-12-16 NOTE — Assessment & Plan Note (Signed)
Now off enbrel ---taking humira (for colitis as well)

## 2020-12-16 NOTE — Progress Notes (Signed)
Subjective:    Patient ID: Damon Torres, male    DOB: Sep 06, 1940, 80 y.o.   MRN: [id number]  HPI Here due to bruising This visit occurred during the SARS-CoV-2 public health emergency.  Safety protocols were in place, including screening questions prior to the visit, additional usage of staff PPE, and extensive cleaning of exam room while observing appropriate contact time as indicated for disinfecting solutions.   His elbows stay bruised and sore Gets bruises other places and they will resolve over time The elbows stay bruised  Uses pads on elbows No known trauma Has wooden chair--but doesn't hit them  Current Outpatient Medications on File Prior to Visit  Medication Sig Dispense Refill   amLODipine-benazepril (LOTREL) 5-10 MG capsule TAKE 1 CAPSULE BY MOUTH DAILY 90 capsule 3   budesonide (ENTOCORT EC) 3 MG 24 hr capsule      calcium-vitamin D (OSCAL WITH D) 500-200 MG-UNIT per tablet Take 1 tablet by mouth daily with breakfast.     CREON 36000-114000 units CPEP capsule TAKE 4 CAPSULES 3 TIMES A  DAY BEFORE MEALS. TAKE UP  TO 2 CAPSULES BEFORE ALL   SNACKS 1620 capsule 3   HUMIRA PEN 40 MG/0.4ML PNKT Inject 40 mg into the skin every 14 (fourteen) days.     ketoconazole (NIZORAL) 2 % shampoo Apply topically 2 (two) times a week.     levothyroxine (SYNTHROID) 75 MCG tablet TAKE 1 TABLET DAILY BEFORE BREAKFAST **MFR MYLAN** 90 tablet 3   Omega-3 Fatty Acids (FISH OIL) 1000 MG CAPS Take 1 capsule by mouth daily.     rivaroxaban (XARELTO) 20 MG TABS tablet Take 20 mg by mouth daily with supper.     No current facility-administered medications on file prior to visit.    No Known Allergies  Past Medical History:  Diagnosis Date   AAA (abdominal aortic aneurysm) ([place]) 12/16   4cm infrarenal   Atrial fibrillation ([place]) 11/14   Collagen vascular disease ([facility])    Hypertension    Pancreatic insufficiency    Rectal cancer ([place]) 2000   At [facility] -stage 3. RT and chemo also    Rheumatoid arthritis(714.0)    Subclinical hypothyroidism 2014    Past Surgical History:  Procedure Laterality Date   CATARACT EXTRACTION W/PHACO Right 02/14/2016   Procedure: CATARACT EXTRACTION PHACO AND INTRAOCULAR LENS PLACEMENT ([place]);  Surgeon: Birder Robson, MD;  Location: [facility] ORS;  Service: Ophthalmology;  Laterality: Right;  [place] 01:02 AP% 18.7 CDE 11.59 Fluid pack lot # [id number]   COLONOSCOPY     COLONOSCOPY WITH PROPOFOL N/A 01/27/2015   Procedure: COLONOSCOPY WITH PROPOFOL;  Surgeon: Josefine Class, MD;  Location: [facility] ENDOSCOPY;  Service: Endoscopy;  Laterality: N/A;   Right foot surgery Right 2/14   toes pinned   TRANSANAL RECTAL RESECTION  2000   [facility]   VARICOSE VEIN SURGERY  1979    Family History  Problem Relation Age of Onset   Liver cancer Mother    Breast cancer Mother    Heart disease Mother    Heart disease Brother        CABG and valve repair   Hypertension Brother    Colon cancer Neg Hx    Esophageal cancer Neg Hx    Rectal cancer Neg Hx    Stomach cancer Neg Hx    Diabetes Neg Hx     Social History   Socioeconomic History   Marital status: Widowed    Spouse name: Not on file  Number of children: 2   Years of education: Not on file   Highest education level: Not on file  Occupational History   Occupation: Retired Charity fundraiser for [employer]    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  Tobacco Use   Smoking status: Former   Smokeless tobacco: Never  Substance and Sexual Activity   Alcohol use: No   Drug use: No   Sexual activity: Not on file  Other Topics Concern   Not on file  Social History Narrative   Widowed 5/20   Now has living will   Daughter is health care POA   Would accept resuscitation attempts   Would not want tube feeds if cognitively unaware   Social Determinants of Health   Financial Resource Strain: Not on file  Food Insecurity: Not on file  Transportation Needs: Not on file  Physical Activity:  Not on file  Stress: Not on file  Social Connections: Not on file  Intimate Partner Violence: Not on file   Review of Systems Humira now for arthritis--seems to be working well (for colitis also) No fever Having more nocturia===every 1.5-2 hours now.     Objective:   Physical Exam Musculoskeletal:     Comments: No swelling in elbows Normal flexion/extension in elbows  No tendon pain (with supination/pronation)  Skin:    Comments: Hyperpigmented areas over medial condyles of both elbows Scattered ecchymotic areas across arms           Assessment & Plan:

## 2020-12-16 NOTE — Assessment & Plan Note (Signed)
Having more trouble with nocturia Ready to try tamsulosin again

## 2020-12-26 NOTE — Telephone Encounter (Signed)
Spoke to pt. He started yesterday with a slight cough, runny nose, body aches. No fever or shortness of breath. He was asking about getting a rx for an antiviral. I advised him Dr Silvio Pate is not in the office this week. He will need a virtual visit with a provider to see if he is a candidate for the medication. He said he will hold off on doing anything right now. He will call back before Wednesday if he wants to make a visit with anyone.

## 2021-01-16 ENCOUNTER — Encounter: Payer: Self-pay | Admitting: Emergency Medicine

## 2021-01-16 ENCOUNTER — Ambulatory Visit (INDEPENDENT_AMBULATORY_CARE_PROVIDER_SITE_OTHER): Payer: Federal, State, Local not specified - PPO

## 2021-01-16 ENCOUNTER — Emergency Department: Payer: Federal, State, Local not specified - PPO

## 2021-01-16 ENCOUNTER — Ambulatory Visit
Admission: EM | Admit: 2021-01-16 | Discharge: 2021-01-16 | Disposition: A | Discharge status: Home / Self Care | Payer: Federal, State, Local not specified - PPO | Attending: Emergency Medicine | Admitting: Emergency Medicine

## 2021-01-16 ENCOUNTER — Other Ambulatory Visit: Payer: Self-pay

## 2021-01-16 ENCOUNTER — Emergency Department
Admission: EM | Admit: 2021-01-16 | Discharge: 2021-01-16 | Disposition: A | Discharge status: Home / Self Care | Payer: Federal, State, Local not specified - PPO | Attending: Emergency Medicine | Admitting: Emergency Medicine

## 2021-01-16 DIAGNOSIS — R0602 Shortness of breath: Secondary | ICD-10-CM

## 2021-01-16 DIAGNOSIS — Z7901 Long term (current) use of anticoagulants: Secondary | ICD-10-CM | POA: Insufficient documentation

## 2021-01-16 DIAGNOSIS — R062 Wheezing: Secondary | ICD-10-CM | POA: Diagnosis not present

## 2021-01-16 DIAGNOSIS — I4891 Unspecified atrial fibrillation: Secondary | ICD-10-CM | POA: Diagnosis not present

## 2021-01-16 DIAGNOSIS — I129 Hypertensive chronic kidney disease with stage 1 through stage 4 chronic kidney disease, or unspecified chronic kidney disease: Secondary | ICD-10-CM | POA: Insufficient documentation

## 2021-01-16 DIAGNOSIS — U099 Post covid-19 condition, unspecified: Secondary | ICD-10-CM | POA: Insufficient documentation

## 2021-01-16 DIAGNOSIS — Z79899 Other long term (current) drug therapy: Secondary | ICD-10-CM | POA: Diagnosis not present

## 2021-01-16 DIAGNOSIS — R630 Anorexia: Secondary | ICD-10-CM | POA: Insufficient documentation

## 2021-01-16 DIAGNOSIS — R5383 Other fatigue: Secondary | ICD-10-CM | POA: Diagnosis not present

## 2021-01-16 DIAGNOSIS — E039 Hypothyroidism, unspecified: Secondary | ICD-10-CM | POA: Insufficient documentation

## 2021-01-16 DIAGNOSIS — R059 Cough, unspecified: Secondary | ICD-10-CM

## 2021-01-16 DIAGNOSIS — Z87891 Personal history of nicotine dependence: Secondary | ICD-10-CM | POA: Diagnosis not present

## 2021-01-16 DIAGNOSIS — R531 Weakness: Secondary | ICD-10-CM | POA: Diagnosis not present

## 2021-01-16 DIAGNOSIS — N1832 Chronic kidney disease, stage 3b: Secondary | ICD-10-CM | POA: Insufficient documentation

## 2021-01-16 DIAGNOSIS — Z85048 Personal history of other malignant neoplasm of rectum, rectosigmoid junction, and anus: Secondary | ICD-10-CM | POA: Insufficient documentation

## 2021-01-16 LAB — CBC WITH DIFFERENTIAL/PLATELET
Abs Immature Granulocytes: 0.05 10*3/uL (ref 0.00–0.07)
Basophils Absolute: 0 10*3/uL (ref 0.0–0.1)
Basophils Relative: 1 %
Eosinophils Absolute: 0.2 10*3/uL (ref 0.0–0.5)
Eosinophils Relative: 5 %
HCT: 32.5 % — ABNORMAL LOW (ref 39.0–52.0)
Hemoglobin: 11.3 g/dL — ABNORMAL LOW (ref 13.0–17.0)
Immature Granulocytes: 1 %
Lymphocytes Relative: 19 %
Lymphs Abs: 0.9 10*3/uL (ref 0.7–4.0)
MCH: 33.8 pg (ref 26.0–34.0)
MCHC: 34.8 g/dL (ref 30.0–36.0)
MCV: 97.3 fL (ref 80.0–100.0)
Monocytes Absolute: 0.7 10*3/uL (ref 0.1–1.0)
Monocytes Relative: 14 %
Neutro Abs: 3.1 10*3/uL (ref 1.7–7.7)
Neutrophils Relative %: 60 %
Platelets: 186 10*3/uL (ref 150–400)
RBC: 3.34 MIL/uL — ABNORMAL LOW (ref 4.22–5.81)
RDW: 12.6 % (ref 11.5–15.5)
WBC: 5 10*3/uL (ref 4.0–10.5)
nRBC: 0 % (ref 0.0–0.2)

## 2021-01-16 LAB — COMPREHENSIVE METABOLIC PANEL
ALT: 21 U/L (ref 0–44)
AST: 28 U/L (ref 15–41)
Albumin: 2.9 g/dL — ABNORMAL LOW (ref 3.5–5.0)
Alkaline Phosphatase: 51 U/L (ref 38–126)
Anion gap: 8 (ref 5–15)
BUN: 33 mg/dL — ABNORMAL HIGH (ref 8–23)
CO2: 24 mmol/L (ref 22–32)
Calcium: 8.9 mg/dL (ref 8.9–10.3)
Chloride: 101 mmol/L (ref 98–111)
Creatinine, Ser: 1.35 mg/dL — ABNORMAL HIGH (ref 0.61–1.24)
GFR, Estimated: 53 mL/min — ABNORMAL LOW (ref >60)
Glucose, Bld: 123 mg/dL — ABNORMAL HIGH (ref 70–99)
Potassium: 4.6 mmol/L (ref 3.5–5.1)
Sodium: 133 mmol/L — ABNORMAL LOW (ref 135–145)
Total Bilirubin: 0.8 mg/dL (ref 0.3–1.2)
Total Protein: 6.4 g/dL — ABNORMAL LOW (ref 6.5–8.1)

## 2021-01-16 LAB — LACTIC ACID, PLASMA: Lactic Acid, Venous: 0.9 mmol/L (ref 0.5–1.9)

## 2021-01-16 MED ORDER — PREDNISONE 10 MG (21) PO TBPK: ORAL_TABLET | ORAL | 0 refills | Status: DC

## 2021-01-16 MED ORDER — AZITHROMYCIN 250 MG PO TABS: ORAL_TABLET | ORAL | 0 refills | Status: DC

## 2021-01-16 MED ORDER — ONDANSETRON 4 MG PO TBDP: 4.0000 mg | ORAL_TABLET | Freq: Three times a day (TID) | ORAL | 0 refills | Status: DC | PRN

## 2021-01-16 MED ORDER — GUAIFENESIN ER 600 MG PO TB12: 600.0000 mg | ORAL_TABLET | Freq: Two times a day (BID) | ORAL | 0 refills | Status: AC

## 2021-01-16 NOTE — ED Triage Notes (Signed)
C/O SOB and cough.  States tested positive for COVID 3 weeks ago.  6 days ago began feeling worse again, c/o SOB and cough.  Referred to ED to evaluate for pneumonia.  Patient AAOx3.  Skin pale warm and dry.  DOE noted.

## 2021-01-16 NOTE — ED Provider Notes (Signed)
[facility] Emergency Department Provider Note  ____________________________________________  Time seen: Approximately 5:27 PM  I have reviewed the triage vital signs and the nursing notes.   HISTORY  Chief Complaint Shortness of Breath    HPI Damon Torres is a 80 y.o. male with a history of A. fib, AAA, hypertension, rheumatoid arthritis on Enbrel who comes ED complaining of cough, fatigue, malaise, loss of appetite, loss of sense of taste and smell.  He was diagnosed with COVID 3 weeks ago, took Paxlovid and felt much better within a few days.  After completing the course, about 7 or 10 days into his illness, symptoms rebounded and have remained.  He denies any significant shortness of breath.  No chest pain.  No dizziness or syncope.  He does feel like he is not eating enough.  Symptoms are constant, waxing waning, no aggravating or alleviating factors.    Past Medical History:  Diagnosis Date   AAA (abdominal aortic aneurysm) ([place]) 12/16   4cm infrarenal   Atrial fibrillation ([place]) 11/14   Collagen vascular disease (HCC)    Hypertension    Pancreatic insufficiency    Rectal cancer ([place]) 2000   At [facility] -stage 3. 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   Ecchymosis 01/22/2019   Hyperlipidemia, mixed 01/06/2019   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   Gastric nodule 03/28/2016    Pancreatic insufficiency 09/29/2015   Macrocytosis 09/29/2015   Pelvic floor dysfunction 09/29/2015   AAA (abdominal aortic aneurysm) ([place])    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   Hypothyroid 06/08/2013   Chronic anticoagulation 06/08/2013   Longstanding persistent atrial fibrillation ([place]) 04/12/2013   Routine general medical examination at a health care facility 09/26/2012   Hallux valgus, left 02/26/2012   History of rectal cancer 08/29/2011   Rheumatoid arthritis involving multiple joints ([place]) 08/29/2011   Essential hypertension 08/29/2011   History of varicose veins 08/29/2011     Past Surgical History:  Procedure Laterality Date   CATARACT EXTRACTION W/PHACO Right 02/14/2016   Procedure: CATARACT EXTRACTION PHACO AND INTRAOCULAR LENS PLACEMENT ([place]);  Surgeon: Birder Robson, MD;  Location: [facility] ORS;  Service: Ophthalmology;  Laterality: Right;  [place] 01:02 AP% 18.7 CDE 11.59 Fluid pack lot # [id number]   COLONOSCOPY     COLONOSCOPY WITH PROPOFOL N/A 01/27/2015   Procedure: COLONOSCOPY WITH PROPOFOL;  Surgeon: Josefine Class, MD;  Location: [facility] ENDOSCOPY;  Service: Endoscopy;  Laterality: N/A;   Right foot surgery Right 2/14   toes pinned   TRANSANAL RECTAL RESECTION  2000   [place]     Prior to Admission medications   Medication Sig Start Date End Date Taking? Authorizing Provider  azithromycin (ZITHROMAX Z-PAK) 250 MG tablet Take 2 tablets (500 mg) on  Day 1,  followed by 1 tablet (250 mg) once daily on Days 2 through 5. 01/16/21  Yes Carrie Mew, MD  guaiFENesin (MUCINEX) 600 MG 12 hr tablet Take 1  tablet (600 mg total) by mouth 2 (two) times daily for 15 days. 01/16/21 01/31/21 Yes Carrie Mew, MD  ondansetron (ZOFRAN ODT) 4 MG disintegrating tablet Take 1 tablet (4 mg total) by mouth every 8 (eight) hours as needed for nausea or vomiting. 01/16/21  Yes Carrie Mew, MD  predniSONE (STERAPRED UNI-PAK 21 TAB) 10 MG (21)  TBPK tablet 6 tablets on day 1, then 5 tablets on day 2, then 4 tablets on day 3, then 3 tablets on day 4, then 2 tablets on day 5, then 1 tablet on day 6. 01/16/21  Yes Carrie Mew, MD  amLODipine-benazepril (LOTREL) 5-10 MG capsule TAKE 1 CAPSULE BY MOUTH DAILY 07/15/20   Venia Carbon, MD  budesonide (ENTOCORT EC) 3 MG 24 hr capsule  01/23/19   [provider]  calcium-vitamin D (OSCAL WITH D) 500-200 MG-UNIT per tablet Take 1 tablet by mouth daily with breakfast.    [provider]  CREON 36000-114000 units CPEP capsule TAKE 4 CAPSULES 3 TIMES A  DAY BEFORE MEALS. TAKE UP  TO 2 CAPSULES BEFORE ALL   SNACKS 08/18/20   Viviana Simpler I, MD  HUMIRA PEN 40 MG/0.4ML PNKT Inject 40 mg into the skin every 14 (fourteen) days. 06/28/20   [provider]  ketoconazole (NIZORAL) 2 % shampoo Apply topically 2 (two) times a week. 10/21/19   [provider]  levothyroxine (SYNTHROID) 75 MCG tablet TAKE 1 TABLET DAILY BEFORE BREAKFAST **MFR MYLAN** 08/22/20   Venia Carbon, MD  Omega-3 Fatty Acids (FISH OIL) 1000 MG CAPS Take 1 capsule by mouth daily.    [provider]  rivaroxaban (XARELTO) 20 MG TABS tablet Take 20 mg by mouth daily with supper.    [provider]  tamsulosin (FLOMAX) 0.4 MG CAPS capsule Take 1 capsule (0.4 mg total) by mouth daily. 12/16/20   Venia Carbon, MD     Allergies Patient has no known allergies.   Family History  Problem Relation Age of Onset   Liver cancer Mother    Breast cancer Mother    Heart disease Mother    Heart disease Brother        CABG and valve repair   Hypertension Brother    Colon cancer Neg Hx    Esophageal cancer Neg Hx    Rectal cancer Neg Hx    Stomach cancer Neg Hx    Diabetes Neg Hx     Social History Social History   Tobacco Use   Smoking status: Former   Smokeless tobacco: Never  Substance Use Topics   Alcohol use: No   Drug use: No    Review of  Systems  Constitutional:   No fever or chills.  ENT:   No sore throat. No rhinorrhea. Cardiovascular:   No chest pain or syncope. Respiratory:   No dyspnea positive cough. Gastrointestinal:   Negative for abdominal pain, vomiting and diarrhea.  Musculoskeletal:   Negative for focal pain or swelling All other systems reviewed and are negative except as documented above in ROS and HPI.  ____________________________________________   PHYSICAL EXAM:  VITAL SIGNS: ED Triage Vitals  Enc Vitals Group     BP 01/16/21 1424 (!) 154/94     Pulse Rate 01/16/21 1424 92     Resp 01/16/21 1424 18     Temp 01/16/21 1424 98 F (36.7 C)     Temp src --      SpO2 01/16/21 1424 100 %  Weight 01/16/21 1316 177 lb 14.6 oz (80.7 kg)     Height 01/16/21 1316 '6\' 3"'$  (1.905 m)     Head Circumference --      Peak Flow --      Pain Score 01/16/21 1316 0     Pain Loc --      Pain Edu? --      Excl. in [place]? --     Vital signs reviewed, nursing assessments reviewed.   Constitutional:   Alert and oriented. Non-toxic appearance. Eyes:   Conjunctivae are normal. EOMI. PERRL. ENT      Head:   Normocephalic and atraumatic.      Nose:   Wearing a mask.      Mouth/Throat:   Wearing a mask.      Neck:   No meningismus. Full ROM. Hematological/Lymphatic/Immunilogical:   No cervical lymphadenopathy. Cardiovascular:   RRR. Symmetric bilateral radial and DP pulses.  No murmurs. Cap refill less than 2 seconds. Respiratory:   Normal respiratory effort without tachypnea/retractions. Breath sounds are clear and equal bilaterally. No wheezes/rales/rhonchi. Gastrointestinal:   Soft and nontender. Non distended. There is no CVA tenderness.  No rebound, rigidity, or guarding. Genitourinary:   deferred Musculoskeletal:   Normal range of motion in all extremities. No joint effusions.  No lower extremity tenderness.  No edema. Neurologic:   Normal speech and language.  Motor grossly intact. No acute focal  neurologic deficits are appreciated.  Skin:    Skin is warm, dry and intact. No rash noted.  No petechiae, purpura, or bullae.  ____________________________________________    LABS (pertinent positives/negatives) (all labs ordered are listed, but only abnormal results are displayed) Labs Reviewed  COMPREHENSIVE METABOLIC PANEL - Abnormal; Notable for the following components:      Result Value   Sodium 133 (*)    Glucose, Bld 123 (*)    BUN 33 (*)    Creatinine, Ser 1.35 (*)    Total Protein 6.4 (*)    Albumin 2.9 (*)    GFR, Estimated 53 (*)    All other components within normal limits  CBC WITH DIFFERENTIAL/PLATELET - Abnormal; Notable for the following components:   RBC 3.34 (*)    Hemoglobin 11.3 (*)    HCT 32.5 (*)    All other components within normal limits  LACTIC ACID, PLASMA   ____________________________________________   EKG  Interpreted by me Atrial fibrillation rate of 81, normal axis and intervals.  Normal QRS ST segments and T waves.  2 PVCs on the strip.  ____________________________________________    RADIOLOGY  DG Chest 2 View  Result Date: 01/16/2021 CLINICAL DATA:  Shortness of breath, wheezing and cough. COVID positive 3 weeks ago. EXAM: CHEST - 2 VIEW COMPARISON:  None. FINDINGS: Trachea is midline. Heart is at the upper limits of normal in size. There is mid and lower lung zone predominant interstitial prominence and indistinctness. No pleural fluid. IMPRESSION: Mid and lower lung zone predominant interstitial prominence and indistinctness can be seen with COVID 19 pneumonia or pulmonary edema. If there is concern for interstitial lung disease, nonemergent high-resolution CT chest without contrast could be performed. Electronically Signed   By: Lorin Picket M.D.   On: 01/16/2021 12:55    ____________________________________________   PROCEDURES Procedures  ____________________________________________  DIFFERENTIAL DIAGNOSIS   Atypical  pneumonia, post-COVID syndrome, dehydration, Paxlovid rebound  CLINICAL IMPRESSION / ASSESSMENT AND PLAN / ED COURSE  Medications ordered in the ED: Medications - No data to display  Pertinent  labs & imaging results that were available during my care of the patient were reviewed by me and considered in my medical decision making (see chart for details).  ALBON BOGDANSKI was evaluated in Emergency Department on 01/16/2021 for the symptoms described in the history of present illness. He was evaluated in the context of the global COVID-19 pandemic, which necessitated consideration that the patient might be at risk for infection with the SARS-CoV-2 virus that causes COVID-19. Institutional protocols and algorithms that pertain to the evaluation of patients at risk for COVID-19 are in a state of rapid change based on information released by regulatory bodies including the CDC and federal and state organizations. These policies and algorithms were followed during the patient's care in the ED.   Patient presents with fatigue, malaise, decreased appetite, loss of sense of taste and smell 3 weeks into COVID illness.  He took Paxlovid initially and was feeling better but then after completing the course worse again, this is consistent with Paxlovid rebound and a post-COVID syndrome.  We will give prednisone, Z-Pak, Mucinex and Zofran.  He is nontoxic, labs are reassuring, not substantially dehydrated, vitals unremarkable, stable for discharge home.      ____________________________________________   FINAL CLINICAL IMPRESSION(S) / ED DIAGNOSES    Final diagnoses:  Post-COVID syndrome     ED Discharge Orders          Ordered    azithromycin (ZITHROMAX Z-PAK) 250 MG tablet        01/16/21 1727    ondansetron (ZOFRAN ODT) 4 MG disintegrating tablet  Every 8 hours PRN        01/16/21 1727    guaiFENesin (MUCINEX) 600 MG 12 hr tablet  2 times daily        01/16/21 1727    predniSONE (STERAPRED  UNI-PAK 21 TAB) 10 MG (21) TBPK tablet        01/16/21 1727            Portions of this note were generated with dragon dictation software. Dictation errors may occur despite best attempts at proofreading.    Carrie Mew, MD 01/16/21 [phone]

## 2021-01-16 NOTE — ED Provider Notes (Signed)
CHIEF COMPLAINT:   Chief Complaint  Patient presents with   Anorexia   Loss of Taste    Chest Congestion     SUBJECTIVE/HPI:  HPI A very pleasant 80 y.o.Male presents today with loss of taste, decreased appetite and congestion.  Patient reports that he had COVID-19 3 weeks ago, took Paxlovid therapy and felt well for 1 week.  Patient states that his symptoms have returned over the last week.  Patient reports that he has some generalized weakness and shortness of breath.  Patient reports no known sick contacts.  Patient does state that he feels worse now than he did when he was diagnosed with COVID-19.  Patient states that he has taken COVID test at home which have shown a negative result.  He does report that his cough is productive of yellow sputum.  Patient does not report any chest pain, palpitations, visual changes, tingling, headache, nausea, vomiting, diarrhea, fever, chills.   has a past medical history of AAA (abdominal aortic aneurysm) ([place]) (12/16), Atrial fibrillation ([place]) (11/14), Collagen vascular disease ([place]), Hypertension, Pancreatic insufficiency, Rectal cancer ([place]) (2000), Rheumatoid arthritis(714.0), and Subclinical hypothyroidism (2014).  ROS:  Review of Systems See Subjective/HPI Medications, Allergies and Problem List personally reviewed in Epic today OBJECTIVE:   Vitals:   01/16/21 1201  BP: (!) 148/86  Pulse: 85  Resp: 14  Temp: 98.9 F (37.2 C)  SpO2: 93%    Physical Exam   General: Appears well-developed and well-nourished. No acute distress.  HEENT Head: Normocephalic and atraumatic.  Ears: Hearing grossly intact, no drainage or visible deformity.  Nose: No nasal deviation.   Mouth/Throat: No stridor or tracheal deviation.  Non erythematous posterior pharynx noted with clear drainage present.  No white patchy exudate noted. Eyes: Conjunctivae and EOM are normal. No eye drainage or scleral icterus bilaterally.  Neck: Normal range of motion, neck is  supple.  Cardiovascular: Normal rate. Regular rhythm; no murmurs, gallops, or rubs.  Pulm/Chest: No respiratory distress. Breath sounds normal bilaterally without wheezes, rhonchi, or rales.  Neurological: Alert and oriented to person, place, and time.  Skin: Skin is warm and dry.  No rashes, lesions, abrasions or bruising noted to skin.   Psychiatric: Normal mood, affect, behavior, and thought content.   Vital signs and nursing note reviewed.   Patient stable and cooperative with examination. PROCEDURES:    LABS/X-RAYS/EKG/MEDS:   No results found for any visits on 01/16/21.  MEDICAL DECISION MAKING:   Patient presents with loss of taste, decreased appetite and congestion.  Patient reports that he had COVID-19 3 weeks ago, took Paxlovid therapy and felt well for 1 week.  Patient states that his symptoms have returned over the last week.  Patient reports that he has some generalized weakness and shortness of breath.  Patient reports no known sick contacts.  Patient does state that he feels worse now than he did when he was diagnosed with COVID-19.  Patient states that he has taken COVID test at home which have shown a negative result.  He does report that his cough is productive of yellow sputum.  Patient also reports that his typical O2 sat is between 98 and 99.  Patient does not report any chest pain, palpitations, visual changes, tingling, headache, nausea, vomiting, diarrhea, fever, chills.  Chart review completed.  CXR: Shows multiple scattered reticular opacities, As read by me, overread pending.  Given decreased O2 sat in clinic today of 93, generalized weakness, shortness of breath and chest x-ray showing multiple  scattered reticular opacities I feel as if the patient will be best served in the emergency department for further evaluation.  Patient will present to the emergency department via private vehicle and is stable on discharge.  Return as needed.  Patient verbalized understanding  and agreed with treatment plan.  Patient stable upon discharge. ASSESSMENT/PLAN:  1. Shortness of breath  2. Generalized weakness Instructions about new medications and side effects provided.  Plan:   Discharge Instructions      Your current condition warrants further evaluation and/or treatment which exceed services available to you in this urgent care setting. I have discussed with you your currrent condition and the need for further evaluation and/or treatment in an emergency department setting. In response to my medical recommendation, you have opted to go to the emergency department.         Serafina Royals, [place] 01/16/21 1301

## 2021-01-16 NOTE — Discharge Instructions (Addendum)
Your current condition warrants further evaluation and/or treatment which exceed services available to you in this urgent care setting. I have discussed with you your currrent condition and the need for further evaluation and/or treatment in an emergency department setting. In response to my medical recommendation, you have opted to go to the emergency department. 

## 2021-01-16 NOTE — ED Triage Notes (Signed)
Pt here with rebound covid sx for one week. Took an antiviral for initial COVID + 3 weeks ago and felt much better for about a week. This week he presents with renewed sx and states he even feels worse than last time.

## 2021-01-20 ENCOUNTER — Telehealth: Payer: Self-pay

## 2021-01-20 NOTE — Telephone Encounter (Signed)
Left message on VM to check and see how he is doing after recent ER visit. If pt calls back, please leave note in this message. Thanks.

## 2021-01-23 NOTE — Telephone Encounter (Signed)
Damon Torres called in returning [place] phone call. He stated that he is doing better.

## 2021-01-24 ENCOUNTER — Encounter: Payer: Self-pay | Admitting: Ophthalmology

## 2021-02-07 ENCOUNTER — Encounter
Admission: RE | Disposition: A | Discharge status: Home / Self Care | Payer: Self-pay | Source: Ambulatory Visit | Attending: Ophthalmology

## 2021-02-07 ENCOUNTER — Ambulatory Visit: Payer: Federal, State, Local not specified - PPO | Admitting: Anesthesiology

## 2021-02-07 ENCOUNTER — Encounter: Payer: Self-pay | Admitting: Ophthalmology

## 2021-02-07 ENCOUNTER — Ambulatory Visit
Admission: RE | Admit: 2021-02-07 | Discharge: 2021-02-07 | Disposition: A | Discharge status: Home / Self Care | Payer: Federal, State, Local not specified - PPO | Source: Ambulatory Visit | Attending: Ophthalmology | Admitting: Ophthalmology

## 2021-02-07 ENCOUNTER — Other Ambulatory Visit: Payer: Self-pay

## 2021-02-07 DIAGNOSIS — Z7952 Long term (current) use of systemic steroids: Secondary | ICD-10-CM | POA: Diagnosis not present

## 2021-02-07 DIAGNOSIS — Z87891 Personal history of nicotine dependence: Secondary | ICD-10-CM | POA: Insufficient documentation

## 2021-02-07 DIAGNOSIS — Z85048 Personal history of other malignant neoplasm of rectum, rectosigmoid junction, and anus: Secondary | ICD-10-CM | POA: Diagnosis not present

## 2021-02-07 DIAGNOSIS — H2512 Age-related nuclear cataract, left eye: Secondary | ICD-10-CM | POA: Diagnosis not present

## 2021-02-07 DIAGNOSIS — Z79899 Other long term (current) drug therapy: Secondary | ICD-10-CM | POA: Insufficient documentation

## 2021-02-07 DIAGNOSIS — Z79890 Hormone replacement therapy (postmenopausal): Secondary | ICD-10-CM | POA: Diagnosis not present

## 2021-02-07 DIAGNOSIS — Z9841 Cataract extraction status, right eye: Secondary | ICD-10-CM | POA: Insufficient documentation

## 2021-02-07 DIAGNOSIS — Z7901 Long term (current) use of anticoagulants: Secondary | ICD-10-CM | POA: Diagnosis not present

## 2021-02-07 DIAGNOSIS — Z961 Presence of intraocular lens: Secondary | ICD-10-CM | POA: Insufficient documentation

## 2021-02-07 HISTORY — PX: CATARACT EXTRACTION W/PHACO: SHX586

## 2021-02-07 SURGERY — PHACOEMULSIFICATION, CATARACT, WITH IOL INSERTION
Anesthesia: Monitor Anesthesia Care | Site: Eye | Laterality: Left

## 2021-02-07 MED ORDER — SIGHTPATH DOSE#1 NA CHONDROIT SULF-NA HYALURON 40-17 MG/ML IO SOLN
INTRAOCULAR | Status: DC | PRN
  Administered 2021-02-07: 1 mL via INTRAOCULAR

## 2021-02-07 MED ORDER — CYCLOPENTOLATE HCL 2 % OP SOLN
1.0000 [drp] | OPHTHALMIC | Status: AC
  Administered 2021-02-07 (×3): 1 [drp] via OPHTHALMIC

## 2021-02-07 MED ORDER — SIGHTPATH DOSE#1 BSS IO SOLN
INTRAOCULAR | Status: DC | PRN
  Administered 2021-02-07: 53 mL via OPHTHALMIC

## 2021-02-07 MED ORDER — BRIMONIDINE TARTRATE-TIMOLOL 0.2-0.5 % OP SOLN
OPHTHALMIC | Status: DC | PRN
  Administered 2021-02-07: 1 [drp] via OPHTHALMIC

## 2021-02-07 MED ORDER — OXYCODONE HCL 5 MG/5ML PO SOLN: 5.0000 mg | Freq: Once | ORAL | Status: DC | PRN

## 2021-02-07 MED ORDER — SIGHTPATH DOSE#1 BSS IO SOLN
INTRAOCULAR | Status: DC | PRN
  Administered 2021-02-07: 15 mL

## 2021-02-07 MED ORDER — LACTATED RINGERS IV SOLN: INTRAVENOUS | Status: DC

## 2021-02-07 MED ORDER — TETRACAINE HCL 0.5 % OP SOLN
1.0000 [drp] | OPHTHALMIC | Status: DC | PRN
  Administered 2021-02-07 (×3): 1 [drp] via OPHTHALMIC

## 2021-02-07 MED ORDER — SIGHTPATH DOSE#1 BSS IO SOLN
INTRAOCULAR | Status: DC | PRN
  Administered 2021-02-07: 1 mL

## 2021-02-07 MED ORDER — MOXIFLOXACIN HCL 0.5 % OP SOLN
OPHTHALMIC | Status: DC | PRN
  Administered 2021-02-07: 0.2 mL via OPHTHALMIC

## 2021-02-07 MED ORDER — MIDAZOLAM HCL 2 MG/2ML IJ SOLN
INTRAMUSCULAR | Status: DC | PRN
  Administered 2021-02-07: .5 mg via INTRAVENOUS

## 2021-02-07 MED ORDER — PHENYLEPHRINE HCL 10 % OP SOLN
1.0000 [drp] | OPHTHALMIC | Status: AC
  Administered 2021-02-07 (×3): 1 [drp] via OPHTHALMIC

## 2021-02-07 MED ORDER — OXYCODONE HCL 5 MG PO TABS: 5.0000 mg | ORAL_TABLET | Freq: Once | ORAL | Status: DC | PRN

## 2021-02-07 MED ORDER — FENTANYL CITRATE (PF) 100 MCG/2ML IJ SOLN
INTRAMUSCULAR | Status: DC | PRN
  Administered 2021-02-07: 50 ug via INTRAVENOUS

## 2021-02-07 SURGICAL SUPPLY — 16 items
CANNULA ANT/CHMB 27GA (MISCELLANEOUS) ×4 IMPLANT
GLOVE SURG ENC TEXT LTX SZ8 (GLOVE) ×2 IMPLANT
GLOVE SURG TRIUMPH 8.0 PF LTX (GLOVE) ×2 IMPLANT
GOWN STRL REUS W/ TWL LRG LVL3 (GOWN DISPOSABLE) ×2 IMPLANT
GOWN STRL REUS W/TWL LRG LVL3 (GOWN DISPOSABLE) ×4
LENS IOL TECNIS EYHANCE 18.5 (Intraocular Lens) ×2 IMPLANT
MARKER SKIN DUAL TIP RULER LAB (MISCELLANEOUS) ×2 IMPLANT
NEEDLE FILTER BLUNT 18X 1/2SAF (NEEDLE) ×1
NEEDLE FILTER BLUNT 18X1 1/2 (NEEDLE) ×1 IMPLANT
PACK EYE AFTER SURG (MISCELLANEOUS) ×2 IMPLANT
SUT ETHILON 10-0 CS-B-6CS-B-6 (SUTURE)
SUTURE EHLN 10-0 CS-B-6CS-B-6 (SUTURE) IMPLANT
SYR 3ML LL SCALE MARK (SYRINGE) ×2 IMPLANT
SYR TB 1ML LUER SLIP (SYRINGE) ×2 IMPLANT
WATER STERILE IRR 250ML POUR (IV SOLUTION) ×2 IMPLANT
WIPE NON LINTING 3.25X3.25 (MISCELLANEOUS) ×2 IMPLANT

## 2021-02-07 NOTE — Anesthesia Preprocedure Evaluation (Addendum)
Anesthesia Evaluation  Patient identified by MRN, date of birth, ID band Patient awake    Reviewed: Allergy & Precautions, H&P , NPO status , Patient's Chart, lab work & pertinent test results  Airway Mallampati: II  TM Distance: >3 FB Neck ROM: full    Dental  (+) Edentulous Upper, Edentulous Lower   Pulmonary neg pulmonary ROS, former smoker,    Pulmonary exam normal        Cardiovascular hypertension, On Medications Normal cardiovascular exam+ dysrhythmias Atrial Fibrillation  Rhythm:irregular Rate:Normal     Neuro/Psych negative psych ROS   GI/Hepatic negative GI ROS, Neg liver ROS,   Endo/Other  Hypothyroidism   Renal/GU   negative genitourinary   Musculoskeletal   Abdominal   Peds  Hematology negative hematology ROS (+)   Anesthesia Other Findings   Reproductive/Obstetrics                            Anesthesia Physical Anesthesia Plan  ASA: 3  Anesthesia Plan: MAC   Post-op Pain Management:    Induction:   PONV Risk Score and Plan: 1 and Midazolam, TIVA and Treatment may vary due to age or medical condition  Airway Management Planned:   Additional Equipment:   Intra-op Plan:   Post-operative Plan:   Informed Consent: I have reviewed the patients History and Physical, chart, labs and discussed the procedure including the risks, benefits and alternatives for the proposed anesthesia with the patient or authorized representative who has indicated his/her understanding and acceptance.       Plan Discussed with:   Anesthesia Plan Comments:         Anesthesia Quick Evaluation

## 2021-02-07 NOTE — Discharge Instructions (Signed)

## 2021-02-07 NOTE — Op Note (Signed)
PREOPERATIVE DIAGNOSIS:  Nuclear sclerotic cataract of the left eye.   POSTOPERATIVE DIAGNOSIS:  Nuclear sclerotic cataract of the left eye.   OPERATIVE PROCEDURE:ORPROCALL@   SURGEON:  Birder Robson, MD.   ANESTHESIA:  Anesthesiologist: Elgie Collard, MD CRNA: Dionne Bucy, CRNA  1.      Managed anesthesia care. 2.     0.39m of Shugarcaine was instilled following the paracentesis   COMPLICATIONS:  None.   TECHNIQUE:   Stop and chop   DESCRIPTION OF PROCEDURE:  The patient was examined and consented in the preoperative holding area where the aforementioned topical anesthesia was applied to the left eye and then brought back to the Operating Room where the left eye was prepped and draped in the usual sterile ophthalmic fashion and a lid speculum was placed. A paracentesis was created with the side port blade and the anterior chamber was filled with viscoelastic. A near clear corneal incision was performed with the steel keratome. A continuous curvilinear capsulorrhexis was performed with a cystotome followed by the capsulorrhexis forceps. Hydrodissection and hydrodelineation were carried out with BSS on a blunt cannula. The lens was removed in a stop and chop  technique and the remaining cortical material was removed with the irrigation-aspiration handpiece. The capsular bag was inflated with viscoelastic and the Technis [id number] lens was placed in the capsular bag without complication. The remaining viscoelastic was removed from the eye with the irrigation-aspiration handpiece. The wounds were hydrated. The anterior chamber was flushed with BSS and the eye was inflated to physiologic pressure. 0.168mVigamox was placed in the anterior chamber. The wounds were found to be water tight. The eye was dressed with Combigan. The patient was given protective glasses to wear throughout the day and a shield with which to sleep tonight. The patient was also given drops with which to begin a drop regimen  today and will follow-up with me in one day. Implant Name Type Inv. Item Serial No. Manufacturer Lot No. LRB No. Used Action  LENS IOL TECNIS EYHANCE 18.5 - [id number] Lens LENS IOL TECNIS EYHANCE 18.5 [id number]   Left 1 Implanted    Procedure(s) with comments: CATARACT EXTRACTION PHACO AND INTRAOCULAR LENS PLACEMENT (IOC) LEFT (Left) - 9.78 0:49.8  Electronically signed: WiBirder Robson/11/2020 10:57 AM

## 2021-02-07 NOTE — Anesthesia Postprocedure Evaluation (Signed)
Anesthesia Post Note  Patient: Damon Torres  Procedure(s) Performed: CATARACT EXTRACTION PHACO AND INTRAOCULAR LENS PLACEMENT ([facility]) LEFT (Left: Eye)     Patient location during evaluation: PACU Anesthesia Type: MAC Level of consciousness: awake and alert Pain management: pain level controlled Vital Signs Assessment: post-procedure vital signs reviewed and stable Respiratory status: spontaneous breathing Cardiovascular status: stable Anesthetic complications: no   No notable events documented.  Gillian Scarce

## 2021-02-07 NOTE — Anesthesia Procedure Notes (Signed)
Procedure Name: MAC Date/Time: 02/07/2021 10:39 AM Performed by: Dionne Bucy, CRNA Pre-anesthesia Checklist: Patient identified, Emergency Drugs available, Suction available, Patient being monitored and Timeout performed Patient Re-evaluated:Patient Re-evaluated prior to induction Oxygen Delivery Method: Nasal cannula Placement Confirmation: positive ETCO2

## 2021-02-07 NOTE — H&P (Signed)
[facility]   Primary Care Physician:  Venia Carbon, MD Ophthalmologist: Dr. George Ina  Pre-Procedure History & Physical: HPI:  Damon Torres is a 80 y.o. male here for cataract surgery.   Past Medical History:  Diagnosis Date   AAA (abdominal aortic aneurysm) ([place]) 12/16   4cm infrarenal   Atrial fibrillation ([place]) 11/14   Collagen vascular disease (HCC)    Hypertension    Pancreatic insufficiency    Rectal cancer ([place]) 2000   At [facility] -stage 3. RT and chemo also   Rheumatoid arthritis(714.0)    Subclinical hypothyroidism 2014    Past Surgical History:  Procedure Laterality Date   CATARACT EXTRACTION W/PHACO Right 02/14/2016   Procedure: CATARACT EXTRACTION PHACO AND INTRAOCULAR LENS PLACEMENT ([place]);  Surgeon: Birder Robson, MD;  Location: [facility] ORS;  Service: Ophthalmology;  Laterality: Right;  [place] 01:02 AP% 18.7 CDE 11.59 Fluid pack lot # [id number]   COLONOSCOPY     COLONOSCOPY WITH PROPOFOL N/A 01/27/2015   Procedure: COLONOSCOPY WITH PROPOFOL;  Surgeon: Josefine Class, MD;  Location: [facility] ENDOSCOPY;  Service: Endoscopy;  Laterality: N/A;   Right foot surgery Right 2/14   toes pinned   TRANSANAL RECTAL RESECTION  2000   [place]    Prior to Admission medications   Medication Sig Start Date End Date Taking? Authorizing Provider  amLODipine-benazepril (LOTREL) 5-10 MG capsule TAKE 1 CAPSULE BY MOUTH DAILY 07/15/20  Yes Venia Carbon, MD  budesonide (ENTOCORT EC) 3 MG 24 hr capsule  01/23/19  Yes [provider]  calcium-vitamin D (OSCAL WITH D) 500-200 MG-UNIT per tablet Take 1 tablet by mouth daily with breakfast.   Yes [provider]  CREON 36000-114000 units CPEP capsule TAKE 4 CAPSULES 3 TIMES A  DAY BEFORE MEALS. TAKE UP  TO 2 CAPSULES BEFORE ALL   SNACKS 08/18/20  Yes Venia Carbon, MD  HUMIRA PEN 40 MG/0.4ML PNKT Inject 40 mg into the skin every 14 (fourteen) days. 06/28/20  Yes [provider]  ketoconazole (NIZORAL) 2 % shampoo Apply topically 2 (two) times a week. 10/21/19  Yes [provider]  levothyroxine (SYNTHROID) 75 MCG tablet TAKE 1 TABLET DAILY BEFORE BREAKFAST **MFR MYLAN** 08/22/20  Yes Venia Carbon, MD  Omega-3 Fatty Acids (FISH OIL) 1000 MG CAPS Take 1 capsule by mouth daily.   Yes [provider]  rivaroxaban (XARELTO) 20 MG TABS tablet Take 20 mg by mouth daily with supper.   Yes [provider]  tamsulosin (FLOMAX) 0.4 MG CAPS capsule Take 1 capsule (0.4 mg total) by mouth daily. 12/16/20  Yes Venia Carbon, MD  azithromycin (ZITHROMAX Z-PAK) 250 MG tablet Take 2 tablets (500 mg) on  Day 1,  followed by 1 tablet (250 mg) once daily on Days 2 through 5. Patient not taking: Reported on 01/24/2021 01/16/21   Carrie Mew, MD  ondansetron (ZOFRAN ODT) 4 MG disintegrating tablet Take 1 tablet (4 mg total) by mouth every 8 (eight) hours as needed for nausea or vomiting. Patient not taking: Reported on 01/24/2021 01/16/21   Carrie Mew, MD  predniSONE (STERAPRED UNI-PAK 21 TAB) 10 MG (21) TBPK tablet 6 tablets on day 1, then 5 tablets on day 2, then 4 tablets on day 3, then 3 tablets on day 4, then 2 tablets on day 5, then 1 tablet on day 6. Patient not taking: Reported on 01/24/2021 01/16/21   Carrie Mew, MD  Allergies as of 01/12/2021   (No Known Allergies)    Family History  Problem Relation Age of Onset   Liver cancer Mother    Breast cancer Mother    Heart disease Mother    Heart disease Brother        CABG and valve repair   Hypertension Brother    Colon cancer Neg Hx    Esophageal cancer Neg Hx    Rectal cancer Neg Hx    Stomach cancer Neg Hx    Diabetes Neg Hx     Social History   Socioeconomic History   Marital status: Widowed    Spouse name: Not on file   Number of children: 2   Years of education: Not on file   Highest education level: Not on file  Occupational History    Occupation: Retired Charity fundraiser for [employer]    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  Tobacco Use   Smoking status: Former   Smokeless tobacco: Never  Substance and Sexual Activity   Alcohol use: No   Drug use: No   Sexual activity: Not on file  Other Topics Concern   Not on file  Social History Narrative   Widowed 5/20   Now has living will   Daughter is health care POA   Would accept resuscitation attempts   Would not want tube feeds if cognitively unaware   Social Determinants of Health   Financial Resource Strain: Not on file  Food Insecurity: Not on file  Transportation Needs: Not on file  Physical Activity: Not on file  Stress: Not on file  Social Connections: Not on file  Intimate Partner Violence: Not on file    Review of Systems: See HPI, otherwise negative ROS  Physical Exam: BP (!) 161/74   Pulse (!) 51   Temp (!) 97 F (36.1 C) (Temporal)   Ht '6\' 3"'$  (1.905 m)   Wt 80.7 kg   SpO2 99%   BMI 22.25 kg/m  General:   Alert, cooperative in NAD Head:  Normocephalic and atraumatic. Respiratory:  Normal work of breathing. Cardiovascular:  RRR  Impression/Plan: Damon Torres is here for cataract surgery.  Risks, benefits, limitations, and alternatives regarding cataract surgery have been reviewed with the patient.  Questions have been answered.  All parties agreeable.   Birder Robson, MD  02/07/2021, 10:31 AM

## 2021-02-07 NOTE — Transfer of Care (Signed)
Immediate Anesthesia Transfer of Care Note  Patient: Damon Torres  Procedure(s) Performed: CATARACT EXTRACTION PHACO AND INTRAOCULAR LENS PLACEMENT (IOC) LEFT (Left: Eye)  Patient Location: PACU  Anesthesia Type: MAC  Level of Consciousness: awake, alert  and patient cooperative  Airway and Oxygen Therapy: Patient Spontanous Breathing and Patient connected to supplemental oxygen  Post-op Assessment: Post-op Vital signs reviewed, Patient's Cardiovascular Status Stable, Respiratory Function Stable, Patent Airway and No signs of Nausea or vomiting  Post-op Vital Signs: Reviewed and stable  Complications: No notable events documented.

## 2021-02-08 ENCOUNTER — Encounter: Payer: Self-pay | Admitting: Ophthalmology

## 2021-03-15 ENCOUNTER — Other Ambulatory Visit (INDEPENDENT_AMBULATORY_CARE_PROVIDER_SITE_OTHER): Payer: Self-pay | Admitting: Vascular Surgery

## 2021-03-15 DIAGNOSIS — I714 Abdominal aortic aneurysm, without rupture, unspecified: Secondary | ICD-10-CM

## 2021-03-17 ENCOUNTER — Ambulatory Visit (INDEPENDENT_AMBULATORY_CARE_PROVIDER_SITE_OTHER): Payer: Federal, State, Local not specified - PPO

## 2021-03-17 ENCOUNTER — Encounter (INDEPENDENT_AMBULATORY_CARE_PROVIDER_SITE_OTHER): Payer: Self-pay | Admitting: Vascular Surgery

## 2021-03-17 ENCOUNTER — Other Ambulatory Visit: Payer: Self-pay

## 2021-03-17 ENCOUNTER — Ambulatory Visit
Admission: EM | Admit: 2021-03-17 | Discharge: 2021-03-17 | Disposition: A | Discharge status: Home / Self Care | Payer: Federal, State, Local not specified - PPO

## 2021-03-17 ENCOUNTER — Ambulatory Visit (INDEPENDENT_AMBULATORY_CARE_PROVIDER_SITE_OTHER): Payer: Federal, State, Local not specified - PPO | Admitting: Vascular Surgery

## 2021-03-17 VITALS — BP 150/73 | HR 50 | Resp 16 | Wt 176.0 lb

## 2021-03-17 DIAGNOSIS — I10 Essential (primary) hypertension: Secondary | ICD-10-CM

## 2021-03-17 DIAGNOSIS — B9689 Other specified bacterial agents as the cause of diseases classified elsewhere: Secondary | ICD-10-CM

## 2021-03-17 DIAGNOSIS — I7143 Infrarenal abdominal aortic aneurysm, without rupture: Secondary | ICD-10-CM | POA: Diagnosis not present

## 2021-03-17 DIAGNOSIS — E782 Mixed hyperlipidemia: Secondary | ICD-10-CM | POA: Diagnosis not present

## 2021-03-17 DIAGNOSIS — I739 Peripheral vascular disease, unspecified: Secondary | ICD-10-CM | POA: Diagnosis not present

## 2021-03-17 DIAGNOSIS — J329 Chronic sinusitis, unspecified: Secondary | ICD-10-CM | POA: Diagnosis not present

## 2021-03-17 DIAGNOSIS — I714 Abdominal aortic aneurysm, without rupture, unspecified: Secondary | ICD-10-CM | POA: Diagnosis not present

## 2021-03-17 MED ORDER — DOXYCYCLINE HYCLATE 100 MG PO CAPS: 100.0000 mg | ORAL_CAPSULE | Freq: Two times a day (BID) | ORAL | 0 refills | Status: AC

## 2021-03-17 NOTE — ED Triage Notes (Signed)
Patient presents to Urgent Care with complaints of cough and nasal congestion x 1 week. Treating symptoms with mucinex and cold/sinus meds with relief. Pt states he had covid in July.   Denies fever, SOB, chest pain.

## 2021-03-17 NOTE — Progress Notes (Signed)
MRN : [id number]  Damon Torres is a 80 y.o. (06-15-1940) male who presents with chief complaint of  Chief Complaint  Patient presents with   Follow-up    Ultrasound follow up  .  History of Present Illness: Patient returns today in follow up of his abdominal aortic aneurysm.  He is doing well and does not have any current aneurysm related symptoms. Specifically, the patient denies new back or abdominal pain, or signs of peripheral embolization.  His biggest issue since his last visit was COVID and some long-term sequela from this. Aortic duplex today shows continued slight growth of his abdominal aortic aneurysm measuring 4.4 cm in maximal diameter today.  It was 4.2 cm a year ago.  Current Outpatient Medications  Medication Sig Dispense Refill   amLODipine-benazepril (LOTREL) 5-10 MG capsule TAKE 1 CAPSULE BY MOUTH DAILY 90 capsule 3   budesonide (ENTOCORT EC) 3 MG 24 hr capsule      calcium-vitamin D (OSCAL WITH D) 500-200 MG-UNIT per tablet Take 1 tablet by mouth daily with breakfast.     CREON 36000-114000 units CPEP capsule TAKE 4 CAPSULES 3 TIMES A  DAY BEFORE MEALS. TAKE UP  TO 2 CAPSULES BEFORE ALL   SNACKS 1620 capsule 3   HUMIRA PEN 40 MG/0.4ML PNKT Inject 40 mg into the skin every 14 (fourteen) days.     ketoconazole (NIZORAL) 2 % shampoo Apply topically 2 (two) times a week.     levothyroxine (SYNTHROID) 75 MCG tablet TAKE 1 TABLET DAILY BEFORE BREAKFAST **MFR MYLAN** 90 tablet 3   Omega-3 Fatty Acids (FISH OIL) 1000 MG CAPS Take 1 capsule by mouth daily.     rivaroxaban (XARELTO) 20 MG TABS tablet Take 20 mg by mouth daily with supper.     tadalafil (CIALIS) 20 MG tablet Take by mouth.     tamsulosin (FLOMAX) 0.4 MG CAPS capsule Take 1 capsule (0.4 mg total) by mouth daily. 30 capsule 3   azithromycin (ZITHROMAX Z-PAK) 250 MG tablet Take 2 tablets (500 mg) on  Day 1,  followed by 1 tablet (250 mg) once daily on Days 2 through 5. (Patient not taking: No sig reported) 6 each  0   ondansetron (ZOFRAN ODT) 4 MG disintegrating tablet Take 1 tablet (4 mg total) by mouth every 8 (eight) hours as needed for nausea or vomiting. (Patient not taking: No sig reported) 20 tablet 0   predniSONE (STERAPRED UNI-PAK 21 TAB) 10 MG (21) TBPK tablet 6 tablets on day 1, then 5 tablets on day 2, then 4 tablets on day 3, then 3 tablets on day 4, then 2 tablets on day 5, then 1 tablet on day 6. (Patient not taking: No sig reported) 21 tablet 0   No current facility-administered medications for this visit.    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 RT and chemo also   Rheumatoid arthritis(714.0)    Subclinical hypothyroidism 2014    Past Surgical History:  Procedure Laterality Date   CATARACT EXTRACTION W/PHACO Right 02/14/2016   Procedure: CATARACT EXTRACTION PHACO AND INTRAOCULAR LENS PLACEMENT ([place]);  Surgeon: Birder Robson, MD;  Location: [facility] ORS;  Service: Ophthalmology;  Laterality: Right;  [place] 01:02 AP% 18.7 CDE 11.59 Fluid pack lot #  [id number]   CATARACT EXTRACTION W/PHACO Left 02/07/2021   Procedure: CATARACT EXTRACTION PHACO AND INTRAOCULAR LENS PLACEMENT ([facility]) LEFT;  Surgeon: Birder Robson, MD;  Location: [place];  Service: Ophthalmology;  Laterality: Left;  9.78 0:49.8   COLONOSCOPY     COLONOSCOPY WITH PROPOFOL N/A 01/27/2015   Procedure: COLONOSCOPY WITH PROPOFOL;  Surgeon: Josefine Class, MD;  Location: [facility] ENDOSCOPY;  Service: Endoscopy;  Laterality: N/A;   Right foot surgery Right 2/14   toes pinned   TRANSANAL RECTAL RESECTION  [place]   [place]   VARICOSE VEIN SURGERY  1979     Social History   Tobacco Use   Smoking status: Former   Smokeless tobacco: Never  Substance Use Topics   Alcohol use: No   Drug use: No       Family History  Problem Relation Age of Onset   Liver cancer Mother    Breast cancer Mother    Heart disease Mother    Heart disease Brother        CABG and valve repair   Hypertension Brother    Colon cancer Neg Hx    Esophageal cancer Neg Hx    Rectal cancer Neg Hx    Stomach cancer Neg Hx    Diabetes Neg Hx      No Known Allergies  REVIEW OF SYSTEMS (Negative unless checked)   Constitutional: [] Weight loss  [] Fever  [] Chills Cardiac: [] Chest pain   [] Chest pressure   [x] Palpitations   [] Shortness of breath when laying flat   [] Shortness of breath at rest   [] Shortness of breath with exertion. Vascular:  [] Pain in legs with walking   [] Pain in legs at rest   [] Pain in legs when laying flat   [] Claudication   [] Pain in feet when walking  [] Pain in feet at rest  [] Pain in feet when laying flat   [] History of DVT   [] Phlebitis   [] Swelling in legs   [] Varicose veins   [] Non-healing ulcers Pulmonary:   [] Uses home oxygen   [] Productive cough   [] Hemoptysis   [] Wheeze  [] COPD   [] Asthma Neurologic:  [] Dizziness  [] Blackouts   [] Seizures   [] History of stroke   [] History of TIA  [] Aphasia   [] Temporary blindness   [] Dysphagia   [] Weakness or numbness in arms   [] Weakness or numbness in legs Musculoskeletal:  [x] Arthritis   [] Joint swelling   [] Joint pain   [] Low back pain Hematologic:  [] Easy bruising  [] Easy bleeding   [] Hypercoagulable state   [] Anemic   Gastrointestinal:  [] Blood in stool   [] Vomiting blood  [] Gastroesophageal reflux/heartburn   [] Abdominal pain Genitourinary:  [] Chronic kidney disease   [] Difficult urination  [] Frequent urination  [] Burning with urination   [] Hematuria Skin:  [] Rashes   [] Ulcers   [] Wounds Psychological:  [] History of anxiety   []  History of major depression.  Physical Examination  BP (!) 150/73 (BP Location: Right Arm)   Pulse (!) 50   Resp 16   Wt 176 lb (79.8 kg)   BMI 22.00 kg/m  Gen:  WD/WN, NAD. Appears younger than stated  age. Head: [place]/AT, No temporalis wasting. Ear/Nose/Throat: Hearing grossly intact, nares w/o erythema or drainage Eyes: Conjunctiva clear. Sclera non-icteric Neck: Supple.  Trachea midline Pulmonary:  Good air movement, no use of accessory muscles.  Cardiac: RRR, no JVD Vascular:  Vessel Right Left  Radial Palpable Palpable  Gastrointestinal: soft, non-tender/non-distended. No guarding/reflex.  Musculoskeletal: M/S 5/5 throughout.  No deformity or atrophy. No edema. Neurologic: Sensation grossly intact in extremities.  Symmetrical.  Speech is fluent.  Psychiatric: Judgment intact, Mood & affect appropriate for pt's clinical situation. Dermatologic: No rashes or ulcers noted.  No cellulitis or open wounds.      Labs Recent Results (from the past 2160 hour(s))  Lactic acid, plasma     Status: None   Collection Time: 01/16/21  2:24 PM  Result Value Ref Range   Lactic Acid, Venous 0.9 0.5 - 1.9 mmol/L    Comment: Performed at [facility], [address] [place], [place] [address]  Comprehensive metabolic panel     Status: Abnormal   Collection Time: 01/16/21  2:24 PM  Result Value Ref Range   Sodium 133 (L) 135 - 145 mmol/L   Potassium 4.6 3.5 - 5.1 mmol/L   Chloride 101 98 - 111 mmol/L   CO2 24 22 - 32 mmol/L   Glucose, Bld 123 (H) 70 - 99 mg/dL    Comment: Glucose reference range applies only to samples taken after fasting for at least 8 hours.   BUN 33 (H) 8 - 23 mg/dL   Creatinine, Ser 1.35 (H) 0.61 - 1.24 mg/dL   Calcium 8.9 8.9 - 10.3 mg/dL   Total Protein 6.4 (L) 6.5 - 8.1 g/dL   Albumin 2.9 (L) 3.5 - 5.0 g/dL   AST 28 15 - 41 U/L   ALT 21 0 - 44 U/L   Alkaline Phosphatase 51 38 - 126 U/L   Total Bilirubin 0.8 0.3 - 1.2 mg/dL   GFR, Estimated 53 (L) >60 mL/min    Comment: (NOTE) Calculated using the CKD-EPI Creatinine Equation (2021)    Anion gap 8 5 - 15    Comment: Performed at [facility], [address] [place], [place] [address]  CBC with Differential     Status: Abnormal   Collection Time: 01/16/21  2:24 PM  Result Value Ref Range   WBC 5.0 4.0 - 10.5 K/uL   RBC 3.34 (L) 4.22 - 5.81 MIL/uL   Hemoglobin 11.3 (L) 13.0 - 17.0 g/dL   HCT 32.5 (L) 39.0 - 52.0 %   MCV 97.3 80.0 - 100.0 fL   MCH 33.8 26.0 - 34.0 pg   MCHC 34.8 30.0 - 36.0 g/dL   RDW 12.6 11.5 - 15.5 %   Platelets 186 150 - 400 K/uL   nRBC 0.0 0.0 - 0.2 %   Neutrophils Relative % 60 %   Neutro Abs 3.1 1.7 - 7.7 K/uL   Lymphocytes Relative 19 %   Lymphs Abs 0.9 0.7 - 4.0 K/uL   Monocytes Relative 14 %   Monocytes Absolute 0.7 0.1 - 1.0 K/uL   Eosinophils Relative 5 %   Eosinophils Absolute 0.2 0.0 - 0.5 K/uL   Basophils Relative 1 %   Basophils Absolute 0.0 0.0 - 0.1 K/uL   Immature Granulocytes 1 %   Abs Immature Granulocytes 0.05 0.00 - 0.07 K/uL    Comment: Performed at [facility], [address] [place], [place] [address]    Radiology No results found.  Assessment/Plan Hyperlipidemia, mixed lipid control important in reducing the progression of atherosclerotic disease. Continue statin therapy     Essential hypertension blood pressure control important in reducing the progression of atherosclerotic disease. On appropriate oral medications.   Peripheral vascular obstructive disease (HCC) ABIs were normal with good digit pressures and  waveforms.  No role for intervention.  Can be checked every few years.  AAA (abdominal aortic aneurysm) ([place]) Aortic duplex today shows continued slight growth of his abdominal aortic aneurysm measuring 4.4 cm in maximal diameter today.  It was 4.2 cm a year ago.  At this point, he should be followed on 11-month intervals.  No role for intervention at this size.  Contact our office with any problems in the interim.    Leotis Pain, MD  03/17/2021 8:38 AM    This note was created with Dragon medical transcription system.  Any errors from dictation  are purely unintentional

## 2021-03-17 NOTE — Discharge Instructions (Signed)
Sinusitis is an infection of the lining of the sinus cavities in your head. Sinusitis often follows a cold. It causes pain and pressure in your head and face.  Take antibiotics as directed. Do not stop taking them just because you feel better. You need to take the full course of antibiotics. Rest, push lots of fluids (especially water), and utilize supportive care for symptoms. Breathe warm, moist area from a steamy shower, hot bath, or sink filled with hot water.  Avoid cold, dry air.  Using a humidifier in your home may help.  Follow the directions for cleaning the machine. Put a hot, wet towel or a warm gel pack on your face 3-4 times a day for 5-10 minutes each time. You may take acetaminophen (Tylenol) every 4-6 hours and ibuprofen every 6-8 hours for muscle pain, joint pain, headaches (you may also alternate these medications). Sudafed (pseudophedrine) is sold behind the counter and can help reduce nasal pressure; avoid taking this if you have high blood pressure or feel jittery. Sudafed PE (phenylephrine) can be a helpful, short-term, over-the-counter alternative to limit side effects or if you have high blood pressure.  Flonase nasal spray can help alleviate congestion and sinus pressure. Many patients choose Afrin as a nasal decongestant; do not use for more than 3 days for risk of rebound (increased symptoms after stopping medication).  Saline nasal sprays or rinses can also help nasal congestion (use bottled or sterile water). Warm tea with lemon and honey can sooth sore throat and cough, as can cough drops.   Return to clinic for new or worse swelling or redness in your face or around your eyes, or if you have a new or higher fever.  

## 2021-03-17 NOTE — ED Provider Notes (Signed)
CHIEF COMPLAINT:   Chief Complaint  Patient presents with   Cough   Nasal Congestion     SUBJECTIVE/HPI:   Cough A very pleasant 80 y.o.Male presents today with cough and nasal congestion for the last 1 week.  Patient reports using Mucinex and cold/sinus medications with some relief.  Patient reports having had COVID-19 in July.. Patient does not report any shortness of breath, chest pain, palpitations, visual changes, weakness, tingling, headache, nausea, vomiting, diarrhea, fever, chills.   has a past medical history of AAA (abdominal aortic aneurysm) (12/16), Atrial fibrillation ([place]) (11/14), Collagen vascular disease ([place]), Hypertension, Pancreatic insufficiency, Rectal cancer ([place]) (2000), Rheumatoid arthritis(714.0), and Subclinical hypothyroidism (2014).  ROS:  Review of Systems  Respiratory:  Positive for cough.   See Subjective/HPI Medications, Allergies and Problem List personally reviewed in Epic today OBJECTIVE:   Vitals:   03/17/21 1100  BP: (!) 165/71  Pulse: (!) 59  Resp: 16  Temp: 97.7 F (36.5 C)  SpO2: 97%    Physical Exam   General: Appears well-developed and well-nourished. No acute distress.  HEENT Head: Normocephalic and atraumatic.  + maxillary sinus tenderness noted to palpation. Ears: Hearing grossly intact, no drainage or visible deformity.  Nose: No nasal deviation. Mouth/Throat: No stridor or tracheal deviation.  Non erythematous posterior pharynx noted with clear drainage present.  No white patchy exudate noted. Eyes: Conjunctivae and EOM are normal. No eye drainage or scleral icterus bilaterally.  Neck: Normal range of motion, neck is supple. No cervical, tonsillar or submandibular lymph nodes palpated.   Cardiovascular: Normal rate. Regular rhythm; no murmurs, gallops, or rubs.  Pulm/Chest: No respiratory distress. Breath sounds normal bilaterally without wheezes, rhonchi, or rales.  Neurological: Alert and oriented to person, place, and  time.  Skin: Skin is warm and dry.  No rashes, lesions, abrasions or bruising noted to skin.   Psychiatric: Normal mood, affect, behavior, and thought content.   Vital signs and nursing note reviewed.   Patient stable and cooperative with examination. PROCEDURES:    LABS/X-RAYS/EKG/MEDS:   No results found for any visits on 03/17/21.  MEDICAL DECISION MAKING:   Patient presents with cough and nasal congestion for the last 1 week.  Patient reports using Mucinex and cold/sinus medications with some relief.  Patient reports having had COVID-19 in July.. Patient does not report any shortness of breath, chest pain, palpitations, visual changes, weakness, tingling, headache, nausea, vomiting, diarrhea, fever, chills.  Given symptoms along with assessment findings, concern for bacterial sinusitis.  Rx'd doxycycline to the patient's preferred pharmacy and advised about home treatment and care to include Tylenol, Mucinex, Flonase, steam, rest and fluids.  Advised to return to clinic for new or worse redness or swelling around his eyes, or new high fever.  Return as needed.  Patient verbalized understanding and agreed with treatment plan.  Patient stable upon discharge. ASSESSMENT/PLAN:  1. Bacterial sinusitis - doxycycline (VIBRAMYCIN) 100 MG capsule; Take 1 capsule (100 mg total) by mouth 2 (two) times daily for 7 days.  Dispense: 14 capsule; Refill: 0 Instructions about new medications and side effects provided.  Plan:   Discharge Instructions      Sinusitis is an infection of the lining of the sinus cavities in your head. Sinusitis often follows a cold. It causes pain and pressure in your head and face. Take antibiotics as directed. Do not stop taking them just because you feel better. You need to take the full course of antibiotics. Rest, push lots of fluids (especially  water), and utilize supportive care for symptoms. Breathe warm, moist area from a steamy shower, hot bath, or sink  filled with hot water.  Avoid cold, dry air.  Using a humidifier in your home may help.  Follow the directions for cleaning the machine. Put a hot, wet towel or a warm gel pack on your face 3-4 times a day for 5-10 minutes each time. You may take acetaminophen (Tylenol) every 4-6 hours and ibuprofen every 6-8 hours for muscle pain, joint pain, headaches (you may also alternate these medications). Sudafed (pseudophedrine) is sold behind the counter and can help reduce nasal pressure; avoid taking this if you have high blood pressure or feel jittery. Sudafed PE (phenylephrine) can be a helpful, short-term, over-the-counter alternative to limit side effects or if you have high blood pressure.  Flonase nasal spray can help alleviate congestion and sinus pressure. Many patients choose Afrin as a nasal decongestant; do not use for more than 3 days for risk of rebound (increased symptoms after stopping medication).  Saline nasal sprays or rinses can also help nasal congestion (use bottled or sterile water). Warm tea with lemon and honey can sooth sore throat and cough, as can cough drops.   Return to clinic for new or worse swelling or redness in your face or around your eyes, or if you have a new or higher fever.          Serafina Royals, [place] 03/17/21 1135

## 2021-03-17 NOTE — Assessment & Plan Note (Signed)
Aortic duplex today shows continued slight growth of his abdominal aortic aneurysm measuring 4.4 cm in maximal diameter today.  It was 4.2 cm a year ago.  At this point, he should be followed on 40-month intervals.  No role for intervention at this size.  Contact our office with any problems in the interim.

## 2021-05-24 ENCOUNTER — Other Ambulatory Visit: Payer: Self-pay

## 2021-05-24 ENCOUNTER — Encounter: Payer: Self-pay | Admitting: Internal Medicine

## 2021-05-24 ENCOUNTER — Ambulatory Visit (INDEPENDENT_AMBULATORY_CARE_PROVIDER_SITE_OTHER): Payer: Federal, State, Local not specified - PPO | Admitting: Internal Medicine

## 2021-05-24 DIAGNOSIS — I4811 Longstanding persistent atrial fibrillation: Secondary | ICD-10-CM | POA: Diagnosis not present

## 2021-05-24 DIAGNOSIS — I872 Venous insufficiency (chronic) (peripheral): Secondary | ICD-10-CM

## 2021-05-24 DIAGNOSIS — Z0000 Encounter for general adult medical examination without abnormal findings: Secondary | ICD-10-CM | POA: Diagnosis not present

## 2021-05-24 DIAGNOSIS — I10 Essential (primary) hypertension: Secondary | ICD-10-CM | POA: Diagnosis not present

## 2021-05-24 DIAGNOSIS — N1832 Chronic kidney disease, stage 3b: Secondary | ICD-10-CM

## 2021-05-24 DIAGNOSIS — K52832 Lymphocytic colitis: Secondary | ICD-10-CM

## 2021-05-24 HISTORY — DX: Venous insufficiency (chronic) (peripheral): I87.2

## 2021-05-24 MED ORDER — BENAZEPRIL-HYDROCHLOROTHIAZIDE 20-12.5 MG PO TABS: 1.0000 | ORAL_TABLET | Freq: Every day | ORAL | 3 refills | Status: DC

## 2021-05-24 MED ORDER — KETOCONAZOLE 2 % EX SHAM: MEDICATED_SHAMPOO | CUTANEOUS | 3 refills | Status: DC

## 2021-05-24 NOTE — Assessment & Plan Note (Signed)
Will stop the amlodipine in case it is contributing and change to HCTZ

## 2021-05-24 NOTE — Assessment & Plan Note (Signed)
Better with the budesonide

## 2021-05-24 NOTE — Progress Notes (Signed)
Subjective:    Patient ID: Damon Torres, male    DOB: 03-22-1941, 80 y.o.   MRN: [id number]  HPI Here for physical  Having fluid retention in feet and legs Notes it daily---better in AM. Worsens quickly and through the day Uncomfortable but not really painful No clear varicose veins Has tried support socks---don't really help Breathing is fine--no cough  Has knot on dorsum of left hand and lots of bruising Noticed it yesterday Slight soreness---but not really painful  Has ED Has tried shot---didn't help Doesn't want to do shots---?pump (urologist told him to get on line)  Last GFR 53  Current Outpatient Medications on File Prior to Visit  Medication Sig Dispense Refill   amLODipine-benazepril (LOTREL) 5-10 MG capsule TAKE 1 CAPSULE BY MOUTH DAILY 90 capsule 3   budesonide (ENTOCORT EC) 3 MG 24 hr capsule      calcium-vitamin D (OSCAL WITH D) 500-200 MG-UNIT per tablet Take 1 tablet by mouth daily with breakfast.     CREON 36000-114000 units CPEP capsule TAKE 4 CAPSULES 3 TIMES A  DAY BEFORE MEALS. TAKE UP  TO 2 CAPSULES BEFORE ALL   SNACKS 1620 capsule 3   HUMIRA PEN 40 MG/0.4ML PNKT Inject 40 mg into the skin every 14 (fourteen) days.     ketoconazole (NIZORAL) 2 % shampoo Apply topically 2 (two) times a week.     levothyroxine (SYNTHROID) 75 MCG tablet TAKE 1 TABLET DAILY BEFORE BREAKFAST **MFR MYLAN** 90 tablet 3   Omega-3 Fatty Acids (FISH OIL) 1000 MG CAPS Take 1 capsule by mouth daily.     rivaroxaban (XARELTO) 20 MG TABS tablet Take 20 mg by mouth daily with supper.     No current facility-administered medications on file prior to visit.    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 RT and chemo also   Rheumatoid  arthritis(714.0)    Subclinical hypothyroidism 2014    Past Surgical History:  Procedure Laterality Date   CATARACT EXTRACTION W/PHACO Right 02/14/2016   Procedure: CATARACT EXTRACTION PHACO AND INTRAOCULAR LENS PLACEMENT ([place]);  Surgeon: Birder Robson, MD;  Location: [facility] ORS;  Service: Ophthalmology;  Laterality: Right;  [place] 01:02 AP% 18.7 CDE 11.59 Fluid pack lot # [id number]   CATARACT EXTRACTION W/PHACO Left 02/07/2021   Procedure: CATARACT EXTRACTION PHACO AND INTRAOCULAR LENS PLACEMENT ([place]) LEFT;  Surgeon: Birder Robson, MD;  Location: [place];  Service: Ophthalmology;  Laterality: Left;  9.78 0:49.8   COLONOSCOPY     COLONOSCOPY WITH PROPOFOL N/A 01/27/2015   Procedure: COLONOSCOPY WITH PROPOFOL;  Surgeon: Josefine Class, MD;  Location: [facility] ENDOSCOPY;  Service: Endoscopy;  Laterality: N/A;   Right foot surgery Right 2/14   toes pinned   TRANSA[facility]  [facility]   VARICOSE VEIN SURGERY  1979    Family History  Problem Relation Age of Onset   Liver cancer Mother    Breast cancer Mother    Heart disease Mother    Heart disease Brother        CABG and valve repair   Hypertension Brother    Colon cancer Neg Hx    Esophageal cancer Neg Hx    Rectal cancer Neg  Hx    Stomach cancer Neg Hx    Diabetes Neg Hx     Social History   Socioeconomic History   Marital status: Widowed    Spouse name: Not on file   Number of children: 2   Years of education: Not on file   Highest education level: Not on file  Occupational History   Occupation: Retired Charity fundraiser for [employer]    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  Tobacco Use   Smoking status: Former   Smokeless tobacco: Never  Substance and Sexual Activity   Alcohol use: No   Drug use: No   Sexual activity: Not on file  Other Topics Concern   Not on file  Social History Narrative   Widowed 5/20   Now has living will   Daughter is health care POA   Would accept  resuscitation attempts   Would not want tube feeds if cognitively unaware   Social Determinants of Health   Financial Resource Strain: Not on file  Food Insecurity: Not on file  Transportation Needs: Not on file  Physical Activity: Not on file  Stress: Not on file  Social Connections: Not on file  Intimate Partner Violence: Not on file     Review of Systems  Constitutional:  Negative for fatigue.       Eating better--has regained some weight  HENT:  Negative for dental problem, hearing loss and tinnitus.        Keeps up with dentist  Eyes:  Negative for visual disturbance.       Cataract removed --vision is better  Respiratory:  Negative for cough, chest tightness and shortness of breath.   Cardiovascular:  Positive for leg swelling. Negative for chest pain and palpitations.  Gastrointestinal:  Negative for abdominal pain and blood in stool.       Doing better with budesonide No heartburn  Endocrine: Negative for polydipsia and polyuria.  Genitourinary:  Negative for difficulty urinating and urgency.  Musculoskeletal:  Negative for arthralgias, back pain and joint swelling.       RA quiet on humira  Skin:  Negative for rash.  Allergic/Immunologic: Negative for environmental allergies and immunocompromised state.  Neurological:  Negative for dizziness, syncope, light-headedness and headaches.  Hematological:  Negative for adenopathy. Bruises/bleeds easily.  Psychiatric/Behavioral:  Negative for dysphoric mood and sleep disturbance. The patient is not nervous/anxious.       Objective:   Physical Exam Constitutional:      Appearance: Normal appearance.  HENT:     Right Ear: Tympanic membrane and ear canal normal.     Left Ear: Tympanic membrane and ear canal normal.     Mouth/Throat:     Pharynx: No oropharyngeal exudate or posterior oropharyngeal erythema.     Comments: Full upper plate Eyes:     Conjunctiva/sclera: Conjunctivae normal.     Pupils: Pupils are equal,  round, and reactive to light.  Cardiovascular:     Rate and Rhythm: Normal rate. Rhythm irregular.     Heart sounds: No murmur heard.   No gallop.     Comments: Faint pulse on left, absent right Pulmonary:     Effort: Pulmonary effort is normal.     Breath sounds: Normal breath sounds. No wheezing or rales.  Abdominal:     Palpations: Abdomen is soft.     Tenderness: There is no abdominal tenderness.  Musculoskeletal:     Cervical back: Neck supple.     Comments: 2+  pitting edema both calves and somewhat less in feet No active synovitis  Lymphadenopathy:     Cervical: No cervical adenopathy.  Skin:    Findings: No lesion or rash.     Comments: Extensive ecchymosis of left hand/forearm Small hematoma--dorsum of wrist  Neurological:     General: No focal deficit present.     Mental Status: He is alert and oriented to person, place, and time.  Psychiatric:        Mood and Affect: Mood normal.        Behavior: Behavior normal.           Assessment & Plan:

## 2021-05-24 NOTE — Assessment & Plan Note (Signed)
Rate is controlled On the xarelto

## 2021-05-24 NOTE — Assessment & Plan Note (Signed)
Fairly healthy Exercises 5 days per week at Y No cancer screening due to age Had flu vaccine Needs bivalent COVID vaccine Consider shingrix when covered

## 2021-05-24 NOTE — Assessment & Plan Note (Signed)
Last GFR better Will recheck at follow up after BP med change

## 2021-05-24 NOTE — Assessment & Plan Note (Signed)
On amlodipine/benazapril---will change to HCTZ and increase benazepril

## 2021-06-29 ENCOUNTER — Encounter: Payer: Self-pay | Admitting: Internal Medicine

## 2021-07-10 ENCOUNTER — Other Ambulatory Visit: Payer: Self-pay | Admitting: Internal Medicine

## 2021-07-20 ENCOUNTER — Encounter: Payer: Federal, State, Local not specified - PPO | Admitting: Dermatology

## 2021-07-27 ENCOUNTER — Encounter: Payer: Federal, State, Local not specified - PPO | Admitting: Dermatology

## 2021-08-10 ENCOUNTER — Ambulatory Visit: Payer: Federal, State, Local not specified - PPO | Admitting: Dermatology

## 2021-08-10 ENCOUNTER — Other Ambulatory Visit: Payer: Self-pay

## 2021-08-10 DIAGNOSIS — Z1283 Encounter for screening for malignant neoplasm of skin: Secondary | ICD-10-CM | POA: Diagnosis not present

## 2021-08-10 DIAGNOSIS — D229 Melanocytic nevi, unspecified: Secondary | ICD-10-CM | POA: Diagnosis not present

## 2021-08-10 DIAGNOSIS — L820 Inflamed seborrheic keratosis: Secondary | ICD-10-CM

## 2021-08-10 DIAGNOSIS — L578 Other skin changes due to chronic exposure to nonionizing radiation: Secondary | ICD-10-CM | POA: Diagnosis not present

## 2021-08-10 DIAGNOSIS — D692 Other nonthrombocytopenic purpura: Secondary | ICD-10-CM | POA: Diagnosis not present

## 2021-08-10 NOTE — Progress Notes (Unsigned)
Follow-Up Visit   Subjective  Damon Torres is a 81 y.o. male who presents for the following: Total body skin exam (Hx of Aks/) and check spot (L forehead above eyebrow, 31m no symptoms).  The patient presents for Total-Body Skin Exam (TBSE) for skin cancer screening and mole check.  The patient has spots, moles and lesions to be evaluated, some may be new or changing and the patient has concerns that these could be cancer.   The following portions of the chart were reviewed this encounter and updated as appropriate:       Review of Systems:  No other skin or systemic complaints except as noted in HPI or Assessment and Plan.  Objective  Well appearing patient in no apparent distress; mood and affect are within normal limits.  A full examination was performed including scalp, head, eyes, ears, nose, lips, neck, chest, axillae, abdomen, back, buttocks, bilateral upper extremities, bilateral lower extremities, hands, feet, fingers, toes, fingernails, and toenails. All findings within normal limits unless otherwise noted below.  L brow x 1 Stuck on waxy paps with erythema    Assessment & Plan   Lentigines - Scattered tan macules - Due to sun exposure - Benign-appearing, observe - Recommend daily broad spectrum sunscreen SPF 30+ to sun-exposed areas, reapply every 2 hours as needed. - Call for any changes - upper back  Seborrheic Keratoses - Stuck-on, waxy, tan-brown papules and/or plaques  - Benign-appearing - Discussed benign etiology and prognosis. - Observe - Call for any changes - back  Melanocytic Nevi - Tan-brown and/or pink-flesh-colored symmetric macules and papules - Benign appearing on exam today - Observation - Call clinic for new or changing moles - Recommend daily use of broad spectrum spf 30+ sunscreen to sun-exposed areas.  - back  Hemangiomas - Red papules - Discussed benign nature - Observe - Call for any changes - arms, trunk  Actinic  Damage - Chronic condition, secondary to cumulative UV/sun exposure - diffuse scaly erythematous macules with underlying dyspigmentation - Recommend daily broad spectrum sunscreen SPF 30+ to sun-exposed areas, reapply every 2 hours as needed.  - Staying in the shade or wearing long sleeves, sun glasses (UVA+UVB protection) and wide brim hats (4-inch brim around the entire circumference of the hat) are also recommended for sun protection.  - Call for new or changing lesions.  Skin cancer screening performed today.  Varicose Veins/Spider Veins - Dilated blue, purple or red veins at the lower extremities - Reassured - Smaller vessels can be treated by sclerotherapy (a procedure to inject a medicine into the veins to make them disappear) if desired, but the treatment is not covered by insurance. Larger vessels may be covered if symptomatic and we would refer to vascular surgeon if treatment desired.   History of PreCancerous Actinic Keratosis  - site(s) of PreCancerous Actinic Keratosis clear today. - these may recur and new lesions may form requiring treatment to prevent transformation into skin cancer - observe for new or changing spots and [place] - photoprotection with sun protective clothing; sunglasses and broad spectrum sunscreen with SPF of at least 30 + and frequent self skin exams recommended - yearly exams by a dermatologist recommended for persons with history of PreCancerous Actinic Keratoses   Inflamed seborrheic keratosis L brow x 1  Destruction of lesion - L brow x 1 Complexity: simple   Destruction method: cryotherapy   Informed consent: discussed and consent obtained   Timeout:  patient name, date of birth, surgical site, and procedure verified Lesion destroyed using liquid nitrogen: Yes   Region frozen until ice ball extended beyond lesion: Yes   Outcome: patient tolerated procedure well with no complications    Post-procedure details: wound care instructions given    Purpura - Chronic; persistent and recurrent.  Treatable, but not curable. - Violaceous macules and patches - Benign - Related to trauma, age, sun damage and/or use of blood thinners, chronic use of topical and/or oral steroids - Observe - Can use OTC arnica containing moisturizer such as Dermend Bruise Formula if desired - Call for worsening or other concerns  Return in about 1 year (around 08/11/2022) for UBSE, Hx of AKs.  I, Othelia Pulling, RMA, am acting as scribe for Sarina Ser, MD .

## 2021-08-10 NOTE — Patient Instructions (Addendum)

## 2021-08-16 ENCOUNTER — Encounter: Payer: Self-pay | Admitting: Dermatology

## 2021-08-22 ENCOUNTER — Ambulatory Visit: Payer: Federal, State, Local not specified - PPO | Admitting: Internal Medicine

## 2021-08-22 ENCOUNTER — Encounter: Payer: Self-pay | Admitting: Internal Medicine

## 2021-08-22 ENCOUNTER — Other Ambulatory Visit: Payer: Self-pay

## 2021-08-22 DIAGNOSIS — K52832 Lymphocytic colitis: Secondary | ICD-10-CM | POA: Diagnosis not present

## 2021-08-22 DIAGNOSIS — M069 Rheumatoid arthritis, unspecified: Secondary | ICD-10-CM

## 2021-08-22 DIAGNOSIS — N1832 Chronic kidney disease, stage 3b: Secondary | ICD-10-CM | POA: Diagnosis not present

## 2021-08-22 DIAGNOSIS — I10 Essential (primary) hypertension: Secondary | ICD-10-CM | POA: Diagnosis not present

## 2021-08-22 NOTE — Assessment & Plan Note (Signed)
Gets flares if he eats fried or spicy foods ?Has reduced to '6mg'$  budesonide daily ?

## 2021-08-22 NOTE — Assessment & Plan Note (Signed)
BP Readings from Last 3 Encounters:  ?08/22/21 138/78  ?05/24/21 140/86  ?03/17/21 (!) 165/71  ? ?Seems to be controlled with the benazepril/HCTZ--20/12.5 ?He notes some elevated BP at home---if stays consistently high, especially AM, will add '10mg'$  benazepril at bedtime ?

## 2021-08-22 NOTE — Assessment & Plan Note (Signed)
No synovitis on exam now with humira every 2 weeks ?

## 2021-08-22 NOTE — Progress Notes (Signed)
? ?Subjective:  ? ? Patient ID: Damon Torres, male    DOB: 11/21/1940, 81 y.o.   MRN: [id number] ? ?HPI ?Here for follow up after med changes for blood pressure ? ?Fluid went away fast after coming off amlodipine ?Has noticed nocturia now--that he didn't have before (2-3 per night) ?Weight is down 10# since last visit ?Monitoring BP at home--he finds 150-160's in AM. Does go down in the afternoon ?He uses 2 different BP machines to make sure they seem okay ? ?Does have trouble with colitis at times--if not careful with eating ?Continues on budesonide--and creon for pancreas replacement ? ?RA quiet on humira every 2 weeks ? ?Last GFR 53 ?Current Outpatient Medications on File Prior to Visit  ?Medication Sig Dispense Refill  ? benazepril-hydrochlorthiazide (LOTENSIN HCT) 20-12.5 MG tablet Take 1 tablet by mouth daily. 90 tablet 3  ? budesonide (ENTOCORT EC) 3 MG 24 hr capsule     ? calcium-vitamin D (OSCAL WITH D) 500-200 MG-UNIT per tablet Take 1 tablet by mouth daily with breakfast.    ? CREON 36000-114000 units CPEP capsule TAKE 4 CAPSULES 3 TIMES A  DAY BEFORE MEALS. TAKE UP  TO 2 CAPSULES BEFORE ALL   SNACKS 1620 capsule 3  ? HUMIRA PEN 40 MG/0.4ML PNKT Inject 40 mg into the skin every 14 (fourteen) days.    ? ketoconazole (NIZORAL) 2 % shampoo Apply topically 2 (two) times a week. 120 mL 3  ? levothyroxine (SYNTHROID) 75 MCG tablet TAKE 1 TABLET DAILY BEFORE BREAKFAST **MFR MYLAN** 90 tablet 3  ? Omega-3 Fatty Acids (FISH OIL) 1000 MG CAPS Take 1 capsule by mouth daily.    ? rivaroxaban (XARELTO) 20 MG TABS tablet Take 20 mg by mouth daily with supper.    ? ?No current facility-administered medications on file prior to visit.  ? ? ?No Known Allergies ? ?Past Medical History:  ?Diagnosis Date  ? 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 4cm infrarenal  ? Atrial fibrillation ([place]) 11/14  ? Collagen vascular disease ([place])   ? Hypertension   ? Pancreatic insufficiency   ? 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 At [facility] -stage  3. RT and chemo also  ? Rheumatoid arthritis(714.0)   ? Subclinical hypothyroidism 2014  ? ? ?Past Surgical History:  ?Procedure Laterality Date  ? CATARACT EXTRACTION W/PHACO Right 02/14/2016  ? Procedure: CATARACT EXTRACTION PHACO AND INTRAOCULAR LENS PLACEMENT ([facility]);  Surgeon: Birder Robson, MD;  Location: [facility] ORS;  Service: Ophthalmology;  Laterality: Right;  [place] 01:02 ?AP% 18.7 ?CDE 11.59 ?Fluid pack lot # [id number]  ? CATARACT EXTRACTION W/PHACO Left 02/07/2021  ? Procedure: CATARACT EXTRACTION PHACO AND INTRAOCULAR LENS PLACEMENT ([place]) LEFT;  Surgeon: Birder Robson, MD;  Location: [place];  Service: Ophthalmology;  Laterality: Left;  9.78 ?0:49.8  ? COLONOSCOPY    ? COLONOSCOPY WITH PROPOFOL N/A 01/27/2015  ? Procedure: COLONOSCOPY WITH PROPOFOL;  Surgeon: Josefine Class, MD;  Location: [facility] ENDOSCOPY;  Service: Endoscopy;  Laterality: N/A;  ? Right foot surgery Right 2/14  ? toes pinned  ? TRANSANAL RECTAL RESECTION  2000  ? [facility]  ? [place]  ? ? ?Family History  ?Problem Relation Age of Onset  ? Liver cancer Mother   ? Breast cancer Mother   ? Heart disease Mother   ? Heart disease Brother   ?     CABG and valve repair  ? Hypertension Brother   ? Colon cancer Neg Hx   ?  Esophageal cancer Neg Hx   ? Rectal cancer Neg Hx   ? Stomach cancer Neg Hx   ? Diabetes Neg Hx   ? ? ?Social History  ? ?Socioeconomic History  ? Marital status: Widowed  ?  Spouse name: Not on file  ? Number of children: 2  ? Years of education: Not on file  ? Highest education level: Not on file  ?Occupational History  ? Occupation: Retired Charity fundraiser for [employer]  ?  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 Smoking status: Former  ?  Passive exposure: Past  ? Smokeless tobacco: Never  ?Substance and Sexual Activity  ? Alcohol use: No  ? Drug use: No  ? Sexual activity: Not on file  ?Other Topics Concern  ? Not on file  ?Social History Narrative  ? Widowed 5/20  ? Now  has living will  ? Daughter is health care POA  ? Would accept resuscitation attempts  ? Would not want tube feeds if cognitively unaware  ? ?Social Determinants of Health  ? ?Financial Resource Strain: Not on file  ?Food Insecurity: Not on file  ?Transportation Needs: Not on file  ?Physical Activity: Not on file  ?Stress: Not on file  ?Social Connections: Not on file  ?Intimate Partner Violence: Not on file  ? ?Review of Systems ?Doesn't sleep great---nothing new (bad sleep habits with daytime 45-60 minute nap). Initiates 10:30 then gets up around 4AM ?Appetite is fine ? ?   ?Objective:  ? Physical Exam ?Constitutional:   ?   Appearance: Normal appearance.  ?Cardiovascular:  ?   Rate and Rhythm: Normal rate and regular rhythm.  ?   Heart sounds: No murmur heard. ?  No gallop.  ?Pulmonary:  ?   Effort: Pulmonary effort is normal.  ?   Breath sounds: Normal breath sounds. No wheezing or rales.  ?Abdominal:  ?   Palpations: Abdomen is soft.  ?   Tenderness: There is no abdominal tenderness.  ?Musculoskeletal:  ?   Cervical back: Neck supple.  ?   Right lower leg: No edema.  ?   Left lower leg: No edema.  ?Lymphadenopathy:  ?   Cervical: No cervical adenopathy.  ?Neurological:  ?   Mental Status: He is alert.  ?Psychiatric:     ?   Mood and Affect: Mood normal.     ?   Behavior: Behavior normal.  ?  ? ? ? ? ?   ?Assessment & Plan:  ? ?

## 2021-08-22 NOTE — Assessment & Plan Note (Signed)
Last GFR was actually improved to 53 ?On the benazepril 20 ?

## 2021-08-22 NOTE — Patient Instructions (Signed)
If your blood pressure stays up regularly---I will add benazepril 10 mg at night. Just let me know. ?

## 2021-09-15 ENCOUNTER — Ambulatory Visit (INDEPENDENT_AMBULATORY_CARE_PROVIDER_SITE_OTHER): Payer: Federal, State, Local not specified - PPO | Admitting: Nurse Practitioner

## 2021-09-15 ENCOUNTER — Ambulatory Visit (INDEPENDENT_AMBULATORY_CARE_PROVIDER_SITE_OTHER): Payer: Federal, State, Local not specified - PPO

## 2021-09-15 ENCOUNTER — Encounter (INDEPENDENT_AMBULATORY_CARE_PROVIDER_SITE_OTHER): Payer: Self-pay | Admitting: Nurse Practitioner

## 2021-09-15 VITALS — BP 169/74 | HR 66 | Resp 16 | Wt 172.4 lb

## 2021-09-15 DIAGNOSIS — I10 Essential (primary) hypertension: Secondary | ICD-10-CM

## 2021-09-15 DIAGNOSIS — I7143 Infrarenal abdominal aortic aneurysm, without rupture: Secondary | ICD-10-CM

## 2021-09-15 DIAGNOSIS — E782 Mixed hyperlipidemia: Secondary | ICD-10-CM | POA: Diagnosis not present

## 2021-09-19 ENCOUNTER — Encounter (INDEPENDENT_AMBULATORY_CARE_PROVIDER_SITE_OTHER): Payer: Self-pay | Admitting: Nurse Practitioner

## 2021-09-19 NOTE — Progress Notes (Signed)
? ?Subjective:  ? ? Patient ID: Damon Torres, male    DOB: [birth year], 81 y.o.   MRN: [id number] ?Chief Complaint  ?Patient presents with  ? Follow-up  ?  Ultrasound follow up  ? ? ?The patient returns to the office for surveillance of a known abdominal aortic aneurysm. Patient denies abdominal pain or back pain, no other abdominal complaints. No changes suggesting embolic episodes.  ? ?There have been no interval changes in the patient's overall health care since his last visit. ? ?Patient denies amaurosis fugax or TIA symptoms. There is no history of claudication or rest pain symptoms of the lower extremities. The patient denies angina or shortness of breath.  ? ?Duplex US of the aorta and iliac arteries shows an AAA measured 4.58 cm.  The patient has 1.7 cm bilateral iliac arteries.  Previous measurement was 4.4 cm on 03/17/2021 of the distal aorta. ? ? ?Review of Systems  ?Gastrointestinal:  Negative for abdominal pain.  ?All other systems reviewed and are negative. ? ?   ?Objective:  ? Physical Exam ?Vitals reviewed.  ?HENT:  ?   Head: Normocephalic.  ?Cardiovascular:  ?   Rate and Rhythm: Normal rate.  ?   Pulses: Normal pulses.  ?Pulmonary:  ?   Effort: Pulmonary effort is normal.  ?Skin: ?   General: Skin is warm and dry.  ?Neurological:  ?   Mental Status: He is alert and oriented to person, place, and time.  ?Psychiatric:     ?   Mood and Affect: Mood normal.     ?   Behavior: Behavior normal.     ?   Thought Content: Thought content normal.     ?   Judgment: Judgment normal.  ? ? ?BP (!) 169/74 (BP Location: Left Arm)   Pulse 66   Resp 16   Wt 172 lb 6.4 oz (78.2 kg)   BMI 21.55 kg/m?  ? ?Past Medical History:  ?Diagnosis Date  ? AAA (abdominal aortic aneurysm) ([place]) 12/16  ? 4cm infrarenal  ? Atrial fibrillation ([place]) 11/14  ? Collagen vascular disease ([place])   ? Hypertension   ? Pancreatic insufficiency   ? Rectal cancer ([place]) 2000  ? At [facility] -stage 3. RT and chemo also  ? Rheumatoid  arthritis(714.0)   ? Subclinical hypothyroidism 2014  ? ? ?Social History  ? ?Socioeconomic History  ? Marital status: Widowed  ?  Spouse name: Not on file  ? Number of children: 2  ? Years of education: Not on file  ? Highest education level: Not on file  ?Occupational History  ? Occupation: Retired Charity fundraiser for [employer]  ?  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 Smoking status: Former  ?  Passive exposure: Past  ? Smokeless tobacco: Never  ?Substance and Sexual Activity  ? Alcohol use: No  ? Drug use: No  ? Sexual activity: Not on file  ?Other Topics Concern  ? Not on file  ?Social History Narrative  ? Widowed 5/20  ? Now has living will  ? Daughter is health care POA  ? Would accept resuscitation attempts  ? Would not want tube feeds if cognitively unaware  ? ?Social Determinants of Health  ? ?Financial Resource Strain: Not on file  ?Food Insecurity: Not on file  ?Transportation Needs: Not on file  ?Physical Activity: Not on file  ?Stress: Not on file  ?Social Connections: Not on file  ?Intimate Partner Violence: Not  on file  ? ? ?Past Surgical History:  ?Procedure Laterality Date  ? CATARACT EXTRACTION W/PHACO Right 02/14/2016  ? Procedure: CATARACT EXTRACTION PHACO AND INTRAOCULAR LENS PLACEMENT (IOC);  Surgeon: Birder Robson, MD;  Location: [facility] ORS;  Service: Ophthalmology;  Laterality: Right;  [place] 01:02 ?AP% 18.7 ?CDE 11.59 ?Fluid pack lot # [id number]  ? CATARACT EXTRACTION W/PHACO Left 02/07/2021  ? Procedure: CATARACT EXTRACTION PHACO AND INTRAOCULAR LENS PLACEMENT ([place]) LEFT;  Surgeon: Birder Robson, MD;  Location: [place];  Service: Ophthalmology;  Laterality: Left;  9.78 ?0:49.8  ? COLONOSCOPY    ? COLONOSCOPY WITH PROPOFOL N/A 01/27/2015  ? Procedure: COLONOSCOPY WITH PROPOFOL;  Surgeon: Josefine Class, MD;  Location: [facility] ENDOSCOPY;  Service: Endoscopy;  Laterality: N/A;  ? Right foot surgery Right 2/14  ? toes pinned  ? TRANSANAL RECTAL RESECTION   2000  ? [facility]  ? [place]  ? ? ?Family History  ?Problem Relation Age of Onset  ? Liver cancer Mother   ? Breast cancer Mother   ? Heart disease Mother   ? Heart disease Brother   ?     CABG and valve repair  ? Hypertension Brother   ? Colon cancer Neg Hx   ? Esophageal cancer Neg Hx   ? Rectal cancer Neg Hx   ? Stomach cancer Neg Hx   ? Diabetes Neg Hx   ? ? ?No Known Allergies ? ? ?  Latest Ref Rng & Units 01/16/2021  ?  2:24 PM 10/11/2017  ?  9:21 AM 10/05/2014  ?  9:21 AM  ?CBC  ?WBC 4.0 - 10.5 K/uL 5.0   5.1   5.6    ?Hemoglobin 13.0 - 17.0 g/dL 11.3   12.0   12.5    ?Hematocrit 39.0 - 52.0 % 32.5   35.6   36.1    ?Platelets 150 - 400 K/uL 186   190.0   208.0    ? ? ? ? ?CMP  ?   ?Component Value Date/Time  ? NA 133 (L) 01/16/2021 1424  ? K 4.6 01/16/2021 1424  ? CL 101 01/16/2021 1424  ? CO2 24 01/16/2021 1424  ? GLUCOSE 123 (H) 01/16/2021 1424  ? BUN 33 (H) 01/16/2021 1424  ? CREATININE 1.35 (H) 01/16/2021 1424  ? CALCIUM 8.9 01/16/2021 1424  ? PROT 6.4 (L) 01/16/2021 1424  ? ALBUMIN 2.9 (L) 01/16/2021 1424  ? AST 28 01/16/2021 1424  ? ALT 21 01/16/2021 1424  ? ALKPHOS 51 01/16/2021 1424  ? BILITOT 0.8 01/16/2021 1424  ? GFRNONAA 53 (L) 01/16/2021 1424  ? ? ? ?No results found. ? ?   ?Assessment & Plan:  ? ?1. Infrarenal abdominal aortic aneurysm (AAA) without rupture ([place]) ?Recommend: ?No surgery or intervention is indicated at this time. ? ?The patient has an asymptomatic abdominal aortic aneurysm that is greater than 4 cm but less than 5 cm in maximal diameter.   ? ?I have reviewed the natural history of abdominal aortic aneurysm and the small risk of rupture for aneurysm less than 5 cm in size.  However, as these small aneurysms tend to enlarge over time, continued surveillance with ultrasound or CT scan is mandatory.  ? ?I have also discussed optimizing medical management with hypertension and lipid control and the importance of abstinence from tobacco.  The patient is also encouraged to  exercise a minimum of 30 minutes 4 times a week.  ? ?Should the patient develop new onset abdominal  or back pain or signs of peripheral embolization they are instructed to seek medical attention immediately and to alert the physician providing care that they have an aneurysm.  ? ?The patient voices their understanding. ? ?I have scheduled the patient to return in 6 months with an aortic duplex.  ? ?2. Essential hypertension ?Continue antihypertensive medications as already ordered, these medications have been reviewed and there are no changes at this time.  ? ?3. Hyperlipidemia, mixed ?Continue statin as ordered and reviewed, no changes at this time  ? ? ?Current Outpatient Medications on File Prior to Visit  ?Medication Sig Dispense Refill  ? benazepril-hydrochlorthiazide (LOTENSIN HCT) 20-12.5 MG tablet Take 1 tablet by mouth daily. 90 tablet 3  ? budesonide (ENTOCORT EC) 3 MG 24 hr capsule     ? calcium-vitamin D (OSCAL WITH D) 500-200 MG-UNIT per tablet Take 1 tablet by mouth daily with breakfast.    ? CREON 36000-114000 units CPEP capsule TAKE 4 CAPSULES 3 TIMES A  DAY BEFORE MEALS. TAKE UP  TO 2 CAPSULES BEFORE ALL   SNACKS 1620 capsule 3  ? HUMIRA PEN 40 MG/0.4ML PNKT Inject 40 mg into the skin every 14 (fourteen) days.    ? ketoconazole (NIZORAL) 2 % shampoo Apply topically 2 (two) times a week. 120 mL 3  ? levothyroxine (SYNTHROID) 75 MCG tablet TAKE 1 TABLET DAILY BEFORE BREAKFAST **MFR MYLAN** 90 tablet 3  ? Omega-3 Fatty Acids (FISH OIL) 1000 MG CAPS Take 1 capsule by mouth daily.    ? rivaroxaban (XARELTO) 20 MG TABS tablet Take 20 mg by mouth daily with supper.    ? ?No current facility-administered medications on file prior to visit.  ? ? ?There are no Patient Instructions on file for this visit. ?No follow-ups on file. ? ? ?Kris Hartmann, NP ? ? ?

## 2021-11-28 ENCOUNTER — Other Ambulatory Visit: Payer: Self-pay | Admitting: Internal Medicine

## 2022-01-31 ENCOUNTER — Other Ambulatory Visit: Payer: Self-pay | Admitting: Internal Medicine

## 2022-02-01 ENCOUNTER — Telehealth: Payer: Self-pay | Admitting: Internal Medicine

## 2022-02-01 MED ORDER — CREON 24000-76000 UNITS PO CPEP: 6.0000 | ORAL_CAPSULE | Freq: Three times a day (TID) | ORAL | 3 refills | Status: DC

## 2022-02-01 NOTE — Addendum Note (Signed)
Addended by: Pilar Grammes on: 02/01/2022 05:01 PM   Modules accepted: Orders

## 2022-02-01 NOTE — Telephone Encounter (Signed)
[facility] called about the Creon and said it was out of stock and wanted to know if the provider wants to try something else. I transferred the call to [facility]

## 2022-02-01 NOTE — Telephone Encounter (Signed)
36,000 is the only dosage on back order. 24,000; 12,000; 6000; 3000; are available. Call with any questions [phone] opt 2 ref #[id number]

## 2022-02-01 NOTE — Telephone Encounter (Signed)
Left message on VM for pt about the changes that were made. New rx sent in.

## 2022-02-02 ENCOUNTER — Other Ambulatory Visit: Payer: Self-pay | Admitting: Orthopedic Surgery

## 2022-02-02 DIAGNOSIS — M75101 Unspecified rotator cuff tear or rupture of right shoulder, not specified as traumatic: Secondary | ICD-10-CM

## 2022-02-15 ENCOUNTER — Ambulatory Visit
Admission: RE | Admit: 2022-02-15 | Discharge: 2022-02-15 | Disposition: A | Discharge status: Home / Self Care | Payer: Federal, State, Local not specified - PPO | Source: Ambulatory Visit | Attending: Orthopedic Surgery | Admitting: Orthopedic Surgery

## 2022-02-15 DIAGNOSIS — M75101 Unspecified rotator cuff tear or rupture of right shoulder, not specified as traumatic: Secondary | ICD-10-CM

## 2022-03-14 ENCOUNTER — Other Ambulatory Visit (INDEPENDENT_AMBULATORY_CARE_PROVIDER_SITE_OTHER): Payer: Self-pay | Admitting: Nurse Practitioner

## 2022-03-14 DIAGNOSIS — I714 Abdominal aortic aneurysm, without rupture, unspecified: Secondary | ICD-10-CM

## 2022-03-20 ENCOUNTER — Ambulatory Visit (INDEPENDENT_AMBULATORY_CARE_PROVIDER_SITE_OTHER): Payer: Medicare Other | Admitting: Vascular Surgery

## 2022-03-20 ENCOUNTER — Ambulatory Visit (INDEPENDENT_AMBULATORY_CARE_PROVIDER_SITE_OTHER): Payer: Federal, State, Local not specified - PPO

## 2022-03-20 DIAGNOSIS — I714 Abdominal aortic aneurysm, without rupture, unspecified: Secondary | ICD-10-CM | POA: Diagnosis not present

## 2022-04-02 ENCOUNTER — Encounter (INDEPENDENT_AMBULATORY_CARE_PROVIDER_SITE_OTHER): Payer: Self-pay

## 2022-06-12 ENCOUNTER — Ambulatory Visit (INDEPENDENT_AMBULATORY_CARE_PROVIDER_SITE_OTHER): Payer: Federal, State, Local not specified - PPO | Admitting: Internal Medicine

## 2022-06-12 ENCOUNTER — Encounter: Payer: Self-pay | Admitting: Internal Medicine

## 2022-06-12 VITALS — BP 130/72 | HR 58 | Temp 97.3°F | Ht 73.5 in | Wt 176.0 lb

## 2022-06-12 DIAGNOSIS — I10 Essential (primary) hypertension: Secondary | ICD-10-CM

## 2022-06-12 DIAGNOSIS — I872 Venous insufficiency (chronic) (peripheral): Secondary | ICD-10-CM | POA: Diagnosis not present

## 2022-06-12 DIAGNOSIS — K8689 Other specified diseases of pancreas: Secondary | ICD-10-CM

## 2022-06-12 DIAGNOSIS — I714 Abdominal aortic aneurysm, without rupture, unspecified: Secondary | ICD-10-CM

## 2022-06-12 DIAGNOSIS — D6869 Other thrombophilia: Secondary | ICD-10-CM

## 2022-06-12 DIAGNOSIS — I4811 Longstanding persistent atrial fibrillation: Secondary | ICD-10-CM

## 2022-06-12 DIAGNOSIS — M069 Rheumatoid arthritis, unspecified: Secondary | ICD-10-CM

## 2022-06-12 DIAGNOSIS — Z0000 Encounter for general adult medical examination without abnormal findings: Secondary | ICD-10-CM

## 2022-06-12 DIAGNOSIS — E039 Hypothyroidism, unspecified: Secondary | ICD-10-CM

## 2022-06-12 DIAGNOSIS — N1832 Chronic kidney disease, stage 3b: Secondary | ICD-10-CM

## 2022-06-12 MED ORDER — HYDROCHLOROTHIAZIDE 25 MG PO TABS: 25.0000 mg | ORAL_TABLET | Freq: Every day | ORAL | 3 refills | Status: DC

## 2022-06-12 NOTE — Assessment & Plan Note (Signed)
Easy bruising on xarelto but nothing pathologic

## 2022-06-12 NOTE — Assessment & Plan Note (Signed)
Does okay with the creon

## 2022-06-12 NOTE — Assessment & Plan Note (Signed)
More bothersome but not painful Elevation--support hose Will increase the HCTZ to 25 Consider furosemide prn

## 2022-06-12 NOTE — Assessment & Plan Note (Signed)
Rate is fine Continues on the xarelto 20

## 2022-06-12 NOTE — Assessment & Plan Note (Signed)
Controlled with the humira

## 2022-06-12 NOTE — Assessment & Plan Note (Signed)
BP Readings from Last 3 Encounters:  06/12/22 130/72  09/15/21 (!) 169/74  08/22/21 138/78   Okay on benazepril 40 Will increase the HCTZ to 25 Check labs in 2 weeks

## 2022-06-12 NOTE — Progress Notes (Signed)
Subjective:    Patient ID: Damon Torres, male    DOB: [birth year], 82 y.o.   MRN: [id number]  HPI Here for physical  Ongoing fluid problems in legs/feet Stopping amlodipine did not help Wears support socks--but not today---not clear they help Not really painful---unless it is worse than usual Doesn't add salt Benazepril increased by cardiologist  Rheumatoid arthritis is controlled on the humira  Is due for colonoscopy this year To follow up on polyps and colitis On budesonide  No palpitations No chest pain or SOB Does exercise--- goes to [employer] 5 days per week No leg pain or claudication  Last GFR was 43  Current Outpatient Medications on File Prior to Visit  Medication Sig Dispense Refill   budesonide (ENTOCORT EC) 3 MG 24 hr capsule      calcium-vitamin D (OSCAL WITH D) 500-200 MG-UNIT per tablet Take 1 tablet by mouth daily with breakfast.     HUMIRA PEN 40 MG/0.4ML PNKT Inject 40 mg into the skin every 14 (fourteen) days.     ketoconazole (NIZORAL) 2 % shampoo Apply topically 2 (two) times a week. 120 mL 3   levothyroxine (SYNTHROID) 75 MCG tablet TAKE 1 TABLET DAILY BEFORE BREAKFAST **MFR MYLAN** 90 tablet 3   Omega-3 Fatty Acids (FISH OIL) 1000 MG CAPS Take 1 capsule by mouth daily.     Pancrelipase, Lip-Prot-Amyl, (CREON) 24000-76000 units CPEP Take 6 capsules (144,000 Units total) by mouth 3 (three) times daily before meals. And 2 capsules with any snacks 2160 capsule 3   rivaroxaban (XARELTO) 20 MG TABS tablet Take 20 mg by mouth daily with supper.     benazepril (LOTENSIN) 40 MG tablet Take 40 mg by mouth daily.     hydrochlorothiazide (HYDRODIURIL) 12.5 MG tablet Take 12.5 mg by mouth daily.     No current facility-administered medications on file prior to visit.    No Known Allergies  Past Medical History:  Diagnosis Date   AAA (abdominal aortic aneurysm) ([place]) 12/16   4cm infrarenal   Atrial fibrillation ([place]) 11/14   Collagen vascular disease  ([facility])    Hypertension    Pancreatic insufficiency    Rectal cancer ([place]) 2000   At [facility] -stage 3. RT and chemo also   Rheumatoid arthritis(714.0)    Subclinical hypothyroidism 2014    Past Surgical History:  Procedure Laterality Date   CATARACT EXTRACTION W/PHACO Right 02/14/2016   Procedure: CATARACT EXTRACTION PHACO AND INTRAOCULAR LENS PLACEMENT ([place]);  Surgeon: Birder Robson, MD;  Location: [facility] ORS;  Service: Ophthalmology;  Laterality: Right;  [place] 01:02 AP% 18.7 CDE 11.59 Fluid pack lot # [id number]   CATARACT EXTRACTION W/PHACO Left 02/07/2021   Procedure: CATARACT EXTRACTION PHACO AND INTRAOCULAR LENS PLACEMENT ([place]) LEFT;  Surgeon: Birder Robson, MD;  Location: [place];  Service: Ophthalmology;  Laterality: Left;  9.78 0:49.8   COLONOSCOPY     COLONOSCOPY WITH PROPOFOL N/A 01/27/2015   Procedure: COLONOSCOPY WITH PROPOFOL;  Surgeon: Josefine Class, MD;  Location: [facility] ENDOSCOPY;  Service: Endoscopy;  Laterality: N/A;   Right foot surgery Right 2/14   toes pinned   TRANSANAL RECTAL RESECTION  2000   [facility]   VARICOSE VEIN SURGERY  1979    Family History  Problem Relation Age of Onset   Liver cancer Mother    Breast cancer Mother    Heart disease Mother    Heart disease Brother        CABG and valve repair   Hypertension  Brother    Colon cancer Neg Hx    Esophageal cancer Neg Hx    Rectal cancer Neg Hx    Stomach cancer Neg Hx    Diabetes Neg Hx     Social History   Socioeconomic History   Marital status: Widowed    Spouse name: Not on file   Number of children: 2   Years of education: Not on file   Highest education level: Not on file  Occupational History   Occupation: Retired Charity fundraiser for [employer]    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  Tobacco Use   Smoking status: Former    Passive exposure: Past   Smokeless tobacco: Never  Substance and Sexual Activity   Alcohol use: No   Drug use: No   Sexual activity: Not  on file  Other Topics Concern   Not on file  Social History Narrative   Widowed 5/20   Now has living will   Daughter is health care POA   Would accept resuscitation attempts   Would not want tube feeds if cognitively unaware   Social Determinants of Health   Financial Resource Strain: Not on file  Food Insecurity: Not on file  Transportation Needs: Not on file  Physical Activity: Not on file  Stress: Not on file  Social Connections: Not on file  Intimate Partner Violence: Not on file   Review of Systems  Constitutional:  Negative for fatigue and unexpected weight change.       Wears seat belt  HENT:  Negative for dental problem, hearing loss and tinnitus.        Sees dentist  Eyes:  Negative for visual disturbance.  Respiratory:  Negative for cough, chest tightness and shortness of breath.   Cardiovascular:  Positive for leg swelling. Negative for chest pain and palpitations.  Gastrointestinal:  Negative for blood in stool.       Bowel problems if he eats the wrong things No heartburn  Endocrine: Negative for polydipsia and polyuria.  Genitourinary:  Negative for difficulty urinating and urgency.       Seems to empty okay  Musculoskeletal:  Negative for back pain and joint swelling.  Allergic/Immunologic: Negative for environmental allergies and immunocompromised state.  Neurological:  Negative for dizziness, syncope, light-headedness and headaches.  Hematological:  Negative for adenopathy. Bruises/bleeds easily.  Psychiatric/Behavioral:  Negative for dysphoric mood and sleep disturbance. The patient is not nervous/anxious.        Objective:   Physical Exam Constitutional:      Appearance: Normal appearance.  HENT:     Mouth/Throat:     Pharynx: No oropharyngeal exudate or posterior oropharyngeal erythema.  Eyes:     Conjunctiva/sclera: Conjunctivae normal.     Pupils: Pupils are equal, round, and reactive to light.  Cardiovascular:     Rate and Rhythm: Normal  rate. Rhythm irregular.     Heart sounds:     No gallop.     Comments: Pulses not palpable Pulmonary:     Effort: Pulmonary effort is normal.     Breath sounds: Normal breath sounds. No wheezing or rales.  Abdominal:     Palpations: Abdomen is soft.     Tenderness: There is no abdominal tenderness.  Musculoskeletal:     Cervical back: Neck supple.     Comments: 1+ edema bilateral feet/lower calves  Lymphadenopathy:     Cervical: No cervical adenopathy.  Skin:    Findings: No rash.  Comments: Scattered ecchymoses mostly on arms  Neurological:     General: No focal deficit present.     Mental Status: He is alert and oriented to person, place, and time.  Psychiatric:        Mood and Affect: Mood normal.        Behavior: Behavior normal.            Assessment & Plan:

## 2022-06-12 NOTE — Assessment & Plan Note (Signed)
Seems euthyroid Will check labs soon

## 2022-06-12 NOTE — Assessment & Plan Note (Signed)
Healthy Exercises regularly Is going to have another colonoscopy this year Had updated COVID and flu vaccines Will check with rheum to confirm if he can have shingrix

## 2022-06-12 NOTE — Assessment & Plan Note (Signed)
Will recheck labs in 2 weeks with increased HCTZ

## 2022-06-12 NOTE — Assessment & Plan Note (Signed)
Now getting every 6 month checks

## 2022-06-26 ENCOUNTER — Other Ambulatory Visit (INDEPENDENT_AMBULATORY_CARE_PROVIDER_SITE_OTHER): Payer: Federal, State, Local not specified - PPO

## 2022-06-26 DIAGNOSIS — N1832 Chronic kidney disease, stage 3b: Secondary | ICD-10-CM | POA: Diagnosis not present

## 2022-06-26 DIAGNOSIS — E039 Hypothyroidism, unspecified: Secondary | ICD-10-CM | POA: Diagnosis not present

## 2022-06-26 LAB — TSH: TSH: 4.43 u[IU]/mL (ref 0.35–5.50)

## 2022-06-26 LAB — RENAL FUNCTION PANEL
Albumin: 3.9 g/dL (ref 3.5–5.2)
BUN: 46 mg/dL — ABNORMAL HIGH (ref 6–23)
CO2: 27 mEq/L (ref 19–32)
Calcium: 9.4 mg/dL (ref 8.4–10.5)
Chloride: 104 mEq/L (ref 96–112)
Creatinine, Ser: 1.57 mg/dL — ABNORMAL HIGH (ref 0.40–1.50)
GFR: 40.98 mL/min — ABNORMAL LOW (ref >60.00)
Glucose, Bld: 86 mg/dL (ref 70–99)
Phosphorus: 3 mg/dL (ref 2.3–4.6)
Potassium: 4.7 mEq/L (ref 3.5–5.1)
Sodium: 141 mEq/L (ref 135–145)

## 2022-06-26 LAB — T4, FREE: Free T4: 1.09 ng/dL (ref 0.60–1.60)

## 2022-07-26 ENCOUNTER — Encounter: Payer: Self-pay | Admitting: Internal Medicine

## 2022-07-30 MED ORDER — PANCRELIPASE (LIP-PROT-AMYL) 36000-114000 UNITS PO CPEP: ORAL_CAPSULE | ORAL | 3 refills | Status: DC

## 2022-07-30 NOTE — Addendum Note (Signed)
Addended by: Pilar Grammes on: 07/30/2022 02:48 PM   Modules accepted: Orders

## 2022-08-09 ENCOUNTER — Ambulatory Visit: Payer: Federal, State, Local not specified - PPO | Admitting: Dermatology

## 2022-08-09 ENCOUNTER — Encounter: Payer: Self-pay | Admitting: Internal Medicine

## 2022-08-09 VITALS — BP 152/93 | HR 65

## 2022-08-09 DIAGNOSIS — D225 Melanocytic nevi of trunk: Secondary | ICD-10-CM | POA: Diagnosis not present

## 2022-08-09 DIAGNOSIS — Z1283 Encounter for screening for malignant neoplasm of skin: Secondary | ICD-10-CM

## 2022-08-09 DIAGNOSIS — L814 Other melanin hyperpigmentation: Secondary | ICD-10-CM

## 2022-08-09 DIAGNOSIS — L219 Seborrheic dermatitis, unspecified: Secondary | ICD-10-CM | POA: Diagnosis not present

## 2022-08-09 DIAGNOSIS — L578 Other skin changes due to chronic exposure to nonionizing radiation: Secondary | ICD-10-CM | POA: Diagnosis not present

## 2022-08-09 DIAGNOSIS — L820 Inflamed seborrheic keratosis: Secondary | ICD-10-CM

## 2022-08-09 DIAGNOSIS — L570 Actinic keratosis: Secondary | ICD-10-CM | POA: Diagnosis not present

## 2022-08-09 DIAGNOSIS — D692 Other nonthrombocytopenic purpura: Secondary | ICD-10-CM

## 2022-08-09 DIAGNOSIS — L821 Other seborrheic keratosis: Secondary | ICD-10-CM

## 2022-08-09 DIAGNOSIS — Z79899 Other long term (current) drug therapy: Secondary | ICD-10-CM

## 2022-08-09 DIAGNOSIS — D1801 Hemangioma of skin and subcutaneous tissue: Secondary | ICD-10-CM

## 2022-08-09 DIAGNOSIS — D229 Melanocytic nevi, unspecified: Secondary | ICD-10-CM

## 2022-08-09 NOTE — Progress Notes (Signed)
Follow-Up Visit   Subjective  Damon Torres is a 82 y.o. male who presents for the following: Upper body skin exam (Hx of AKs). The patient presents for Upper Body Skin Exam (UBSE) for skin cancer screening and mole check.  The patient has spots, moles and lesions to be evaluated, some may be new or changing and the patient has concerns that these could be cancer.   The following portions of the chart were reviewed this encounter and updated as appropriate:   Tobacco  Allergies  Meds  Problems  Med Hx  Surg Hx  Fam Hx     Review of Systems:  No other skin or systemic complaints except as noted in HPI or Assessment and Plan.  Objective  Well appearing patient in no apparent distress; mood and affect are within normal limits.  All skin waist up examined.  Scalp Pink patches with greasy scale.   face x 1, R side x 1 (2) Stuck on waxy paps with erythema  face x 5 (5) Pink scaly macules   Assessment & Plan   Lentigines - Scattered tan macules - Due to sun exposure - Benign-appearing, observe - Recommend daily broad spectrum sunscreen SPF 30+ to sun-exposed areas, reapply every 2 hours as needed. - Call for any changes - upper back  Seborrheic Keratoses - Stuck-on, waxy, tan-brown papules and/or plaques  - Benign-appearing - Discussed benign etiology and prognosis. - Observe - Call for any changes - trunk  Melanocytic Nevi - Tan-brown and/or pink-flesh-colored symmetric macules and papules - Benign appearing on exam today - Observation - Call clinic for new or changing moles - Recommend daily use of broad spectrum spf 30+ sunscreen to sun-exposed areas.  - trunk  Hemangiomas - Red papules - Discussed benign nature - Observe - Call for any changes - trunk  Actinic Damage - Chronic condition, secondary to cumulative UV/sun exposure - diffuse scaly erythematous macules with underlying dyspigmentation - Recommend daily broad spectrum sunscreen SPF 30+  to sun-exposed areas, reapply every 2 hours as needed.  - Staying in the shade or wearing long sleeves, sun glasses (UVA+UVB protection) and wide brim hats (4-inch brim around the entire circumference of the hat) are also recommended for sun protection.  - Call for new or changing lesions.  Skin cancer screening performed today.   Seborrheic dermatitis Scalp  Seborrheic Dermatitis  -  is a chronic persistent rash characterized by pinkness and scaling most commonly of the mid face but also can occur on the scalp (dandruff), ears; mid chest, mid back and groin.  It tends to be exacerbated by stress and cooler weather.  People who have neurologic disease may experience new onset or exacerbation of existing seborrheic dermatitis.  The condition is not curable but treatable and can be controlled.  Cont Ketoconazole 2% shampoo 2-3x/wk as prescribed by Dr. Silvio Pate Recommend shampooing 4-7 d/wk with Ketoconazole shampoo and regular shampoo  Inflamed seborrheic keratosis (2) face x 1, R side x 1  Symptomatic, irritating, patient would like treated.   Destruction of lesion - face x 1, R side x 1 Complexity: simple   Destruction method: cryotherapy   Informed consent: discussed and consent obtained   Timeout:  patient name, date of birth, surgical site, and procedure verified Lesion destroyed using liquid nitrogen: Yes   Region frozen until ice ball extended beyond lesion: Yes   Outcome: patient tolerated procedure well with no complications   Post-procedure details: wound care instructions given  AK (actinic keratosis) (5) face x 5  Destruction of lesion - face x 5 Complexity: simple   Destruction method: cryotherapy   Informed consent: discussed and consent obtained   Timeout:  patient name, date of birth, surgical site, and procedure verified Lesion destroyed using liquid nitrogen: Yes   Region frozen until ice ball extended beyond lesion: Yes   Outcome: patient tolerated procedure  well with no complications   Post-procedure details: wound care instructions given    Purpura - Chronic; persistent and recurrent.  Treatable, but not curable. - Violaceous macules and patches - Benign - Related to trauma, age, sun damage and/or use of blood thinners, chronic use of topical and/or oral steroids - Observe - Can use OTC arnica containing moisturizer such as Dermend Bruise Formula if desired - Call for worsening or other concerns  - arms, hands  Return in about 1 year (around 08/09/2023) for UBSE, Hx of AKs.  I, Othelia Pulling, RMA, am acting as scribe for Sarina Ser, MD . Documentation: I have reviewed the above documentation for accuracy and completeness, and I agree with the above.  Sarina Ser, MD

## 2022-08-09 NOTE — Patient Instructions (Addendum)
 Cryotherapy Aftercare  Wash gently with soap and water everyday.   Apply Vaseline and Band-Aid daily until healed. Due to recent changes in healthcare laws, you may see results of your pathology and/or laboratory studies on MyChart before the doctors have had a chance to review them. We understand that in some cases there may be results that are confusing or concerning to you. Please understand that not all results are received at the same time and often the doctors may need to interpret multiple results in order to provide you with the best plan of care or course of treatment. Therefore, we ask that you please give us 2 business days to thoroughly review all your results before contacting the office for clarification. Should we see a critical lab result, you will be contacted sooner.   If You Need Anything After Your Visit  If you have any questions or concerns for your doctor, please call our main line at [phone] and press option 4 to reach your doctor's medical assistant. If no one answers, please leave a voicemail as directed and we will return your call as soon as possible. Messages left after 4 pm will be answered the following business day.   You may also send us a message via MyChart. We typically respond to MyChart messages within 1-2 business days.  For prescription refills, please ask your pharmacy to contact our office. Our fax number is [phone].  If you have an urgent issue when the clinic is closed that cannot wait until the next business day, you can page your doctor at the number below.    Please note that while we do our best to be available for urgent issues outside of office hours, we are not available 24/7.   If you have an urgent issue and are unable to reach us, you may choose to seek medical care at your doctor's office, retail clinic, urgent care center, or emergency room.  If you have a medical emergency, please immediately call 911 or go to the emergency  department.  Pager Numbers  - Dr. Kowalski: [phone]  - Dr. Moye: [phone]  - Dr. Stewart: [phone]  In the event of inclement weather, please call our main line at [phone] for an update on the status of any delays or closures.  Dermatology Medication Tips: Please keep the boxes that topical medications come in in order to help keep track of the instructions about where and how to use these. Pharmacies typically print the medication instructions only on the boxes and not directly on the medication tubes.   If your medication is too expensive, please contact our office at [phone] option 4 or send us a message through MyChart.   We are unable to tell what your co-pay for medications will be in advance as this is different depending on your insurance coverage. However, we may be able to find a substitute medication at lower cost or fill out paperwork to get insurance to cover a needed medication.   If a prior authorization is required to get your medication covered by your insurance company, please allow us 1-2 business days to complete this process.  Drug prices often vary depending on where the prescription is filled and some pharmacies may offer cheaper prices.  The website www.goodrx.com contains coupons for medications through different pharmacies. The prices here do not account for what the cost may be with help from insurance (it may be cheaper with your insurance), but the website can give you the   price if you did not use any insurance.  - You can print the associated coupon and take it with your prescription to the pharmacy.  - You may also stop by our office during regular business hours and pick up a GoodRx coupon card.  - If you need your prescription sent electronically to a different pharmacy, notify our office through [place] MyChart or by phone at [phone] option 4.     Si Usted Necesita Algo Despus de Su Visita  Tambin puede enviarnos un  mensaje a travs de MyChart. Por lo general respondemos a los mensajes de MyChart en el transcurso de 1 a 2 das hbiles.  Para renovar recetas, por favor pida a su farmacia que se ponga en contacto con nuestra oficina. Nuestro nmero de fax es el [phone].  Si tiene un asunto urgente cuando la clnica est cerrada y que no puede esperar hasta el siguiente da hbil, puede llamar/localizar a su doctor(a) al nmero que aparece a continuacin.   Por favor, tenga en cuenta que aunque hacemos todo lo posible para estar disponibles para asuntos urgentes fuera del horario de oficina, no estamos disponibles las 24 horas del da, los 7 das de la semana.   Si tiene un problema urgente y no puede comunicarse con nosotros, puede optar por buscar atencin mdica  en el consultorio de su doctor(a), en una clnica privada, en un centro de atencin urgente o en una sala de emergencias.  Si tiene una emergencia mdica, por favor llame inmediatamente al 911 o vaya a la sala de emergencias.  Nmeros de bper  - Dr. Kowalski: [phone]  - Dra. Moye: [phone]  - Dra. Stewart: [phone]  En caso de inclemencias del tiempo, por favor llame a nuestra lnea principal al [phone] para una actualizacin sobre el estado de cualquier retraso o cierre.  Consejos para la medicacin en dermatologa: Por favor, guarde las cajas en las que vienen los medicamentos de uso tpico para ayudarle a seguir las instrucciones sobre dnde y cmo usarlos. Las farmacias generalmente imprimen las instrucciones del medicamento slo en las cajas y no directamente en los tubos del medicamento.   Si su medicamento es muy caro, por favor, pngase en contacto con nuestra oficina llamando al [phone] y presione la opcin 4 o envenos un mensaje a travs de MyChart.   No podemos decirle cul ser su copago por los medicamentos por adelantado ya que esto es diferente dependiendo de la cobertura de su seguro. Sin embargo,  es posible que podamos encontrar un medicamento sustituto a menor costo o llenar un formulario para que el seguro cubra el medicamento que se considera necesario.   Si se requiere una autorizacin previa para que su compaa de seguros cubra su medicamento, por favor permtanos de 1 a 2 das hbiles para completar este proceso.  Los precios de los medicamentos varan con frecuencia dependiendo del lugar de dnde se surte la receta y alguna farmacias pueden ofrecer precios ms baratos.  El sitio web www.goodrx.com tiene cupones para medicamentos de diferentes farmacias. Los precios aqu no tienen en cuenta lo que podra costar con la ayuda del seguro (puede ser ms barato con su seguro), pero el sitio web puede darle el precio si no utiliz ningn seguro.  - Puede imprimir el cupn correspondiente y llevarlo con su receta a la farmacia.  - Tambin puede pasar por nuestra oficina durante el horario de atencin regular y recoger una tarjeta de cupones de GoodRx.  - Si necesita que   su receta se enve electrnicamente a una farmacia diferente, informe a nuestra oficina a travs de MyChart de [place] o por telfono llamando al [phone] y presione la opcin 4.  

## 2022-08-10 MED ORDER — FUROSEMIDE 20 MG PO TABS: 20.0000 mg | ORAL_TABLET | Freq: Every day | ORAL | 1 refills | Status: DC | PRN

## 2022-08-12 ENCOUNTER — Encounter: Payer: Self-pay | Admitting: Dermatology

## 2022-08-14 IMAGING — DX DG CHEST 2V
2 series · 2 of 2 positions shown · non-contrast
Comparison: None.

CLINICAL DATA: Shortness of breath, wheezing and cough. COVID
positive 3 weeks ago.

EXAM:
CHEST - 2 VIEW

[chest pa]
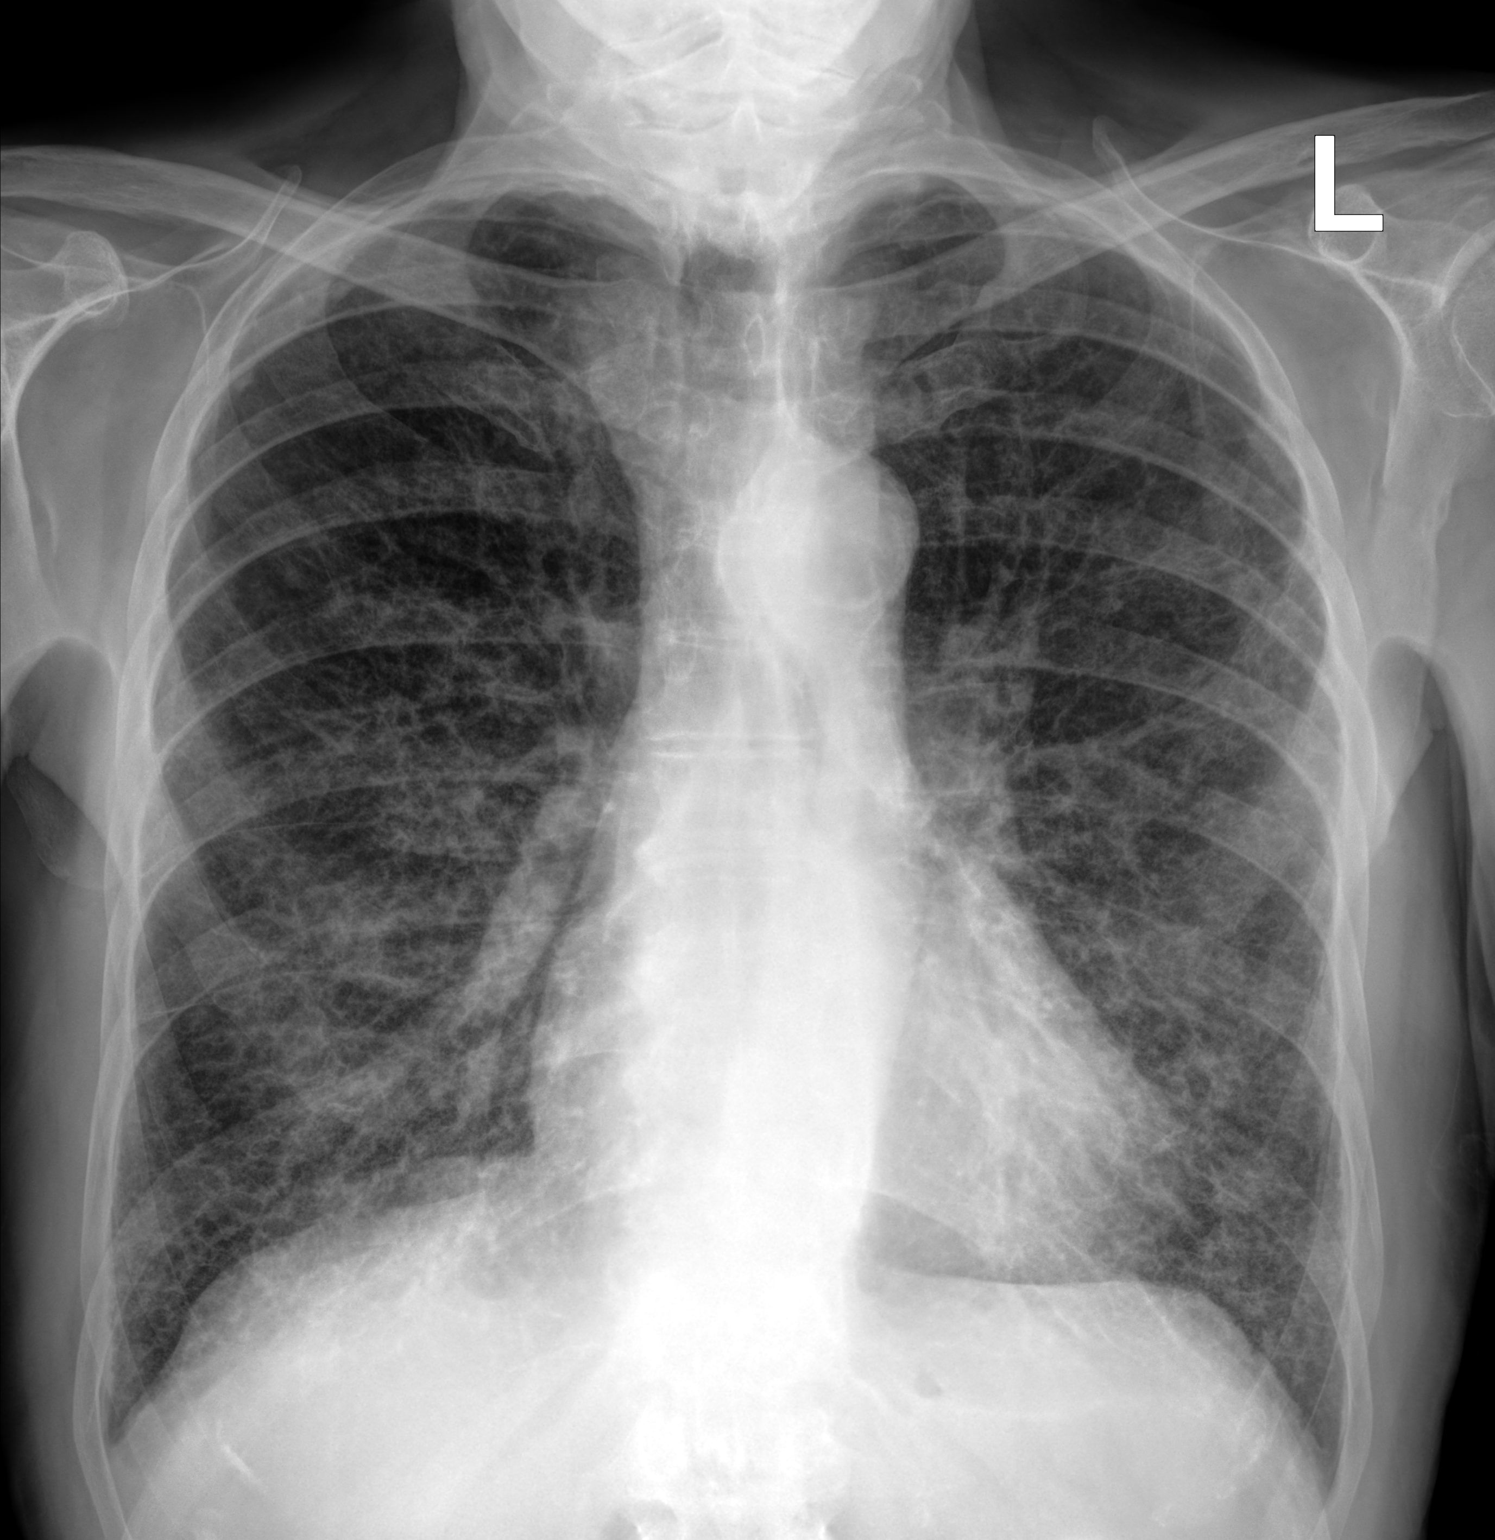

[chest lat]
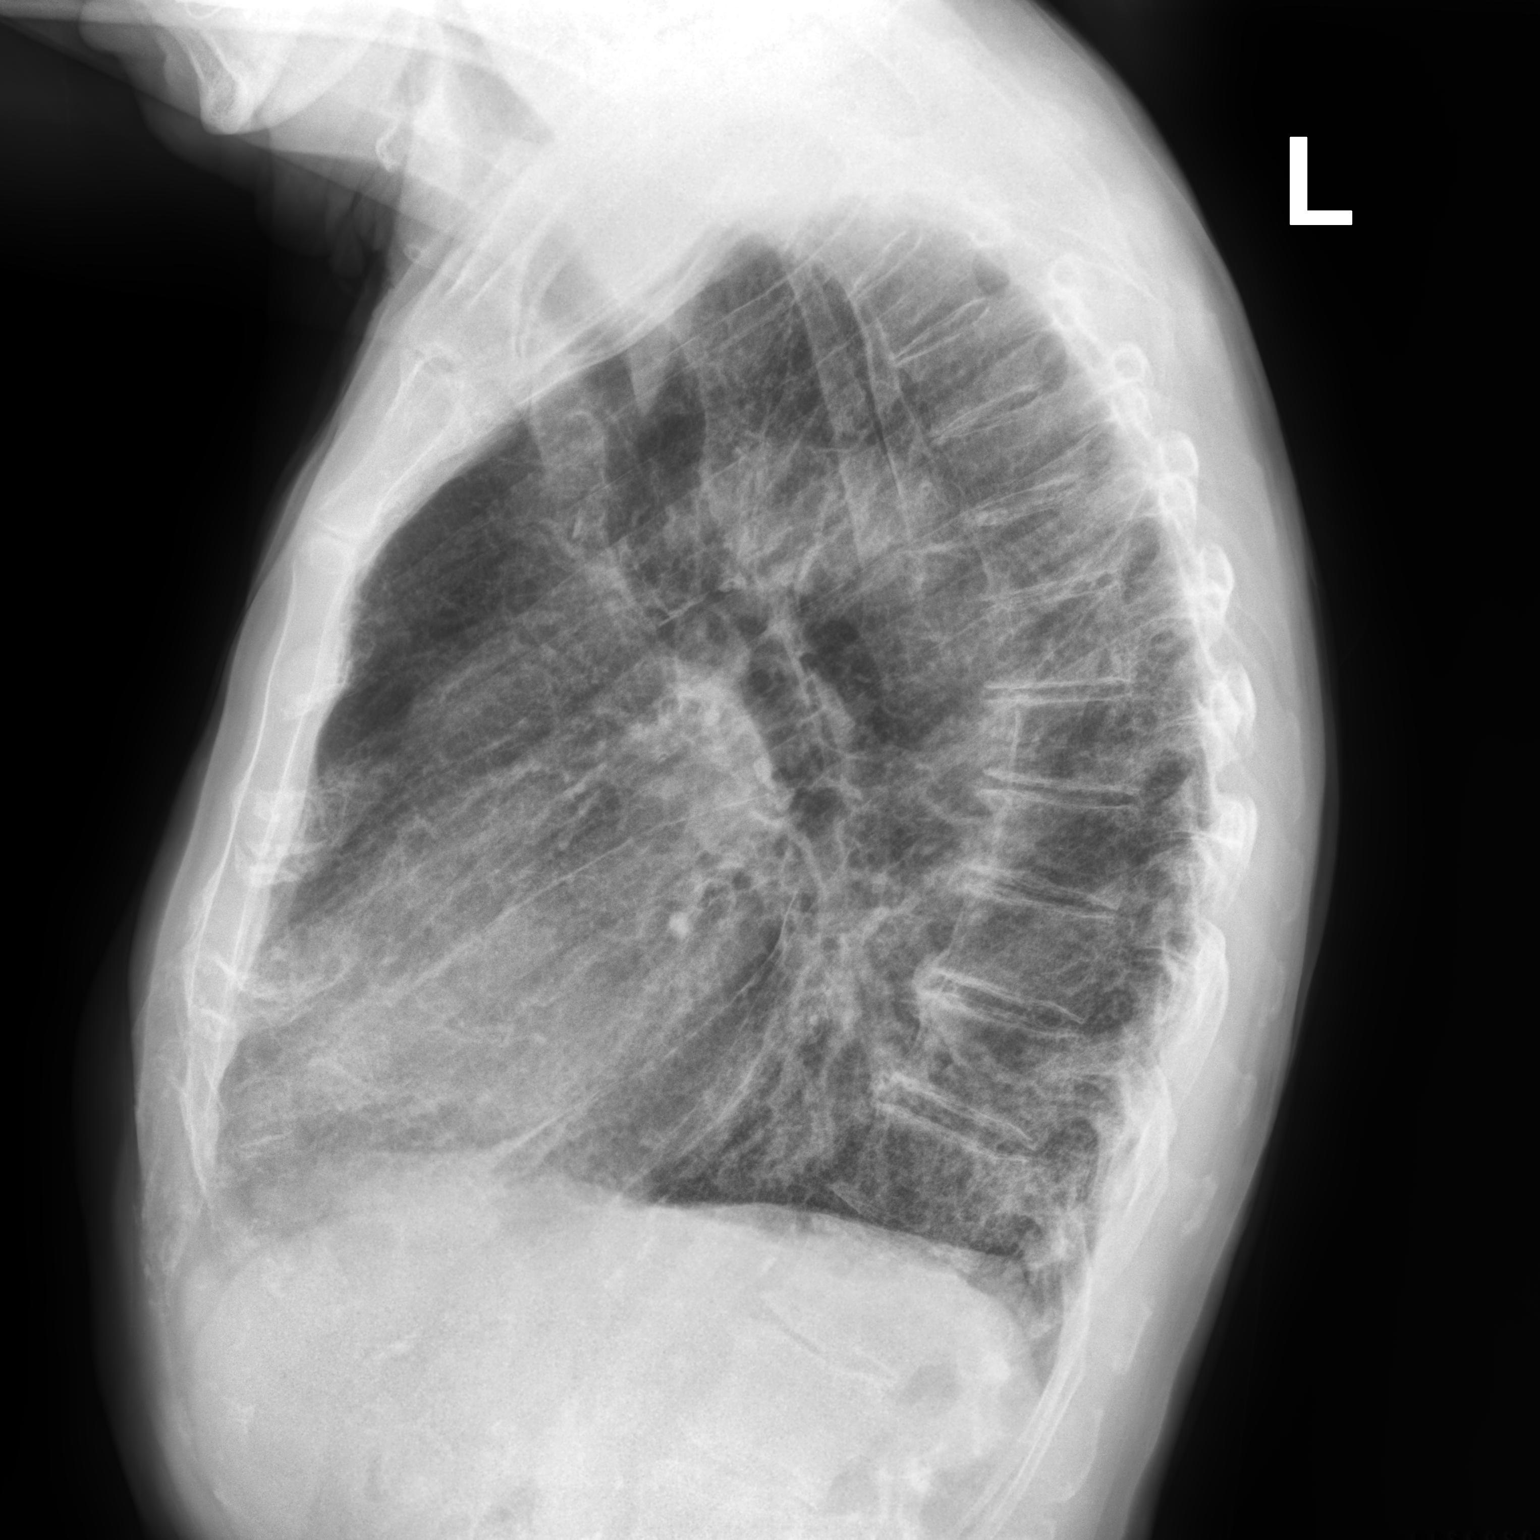

[2 of 2 positions shown; findings below may reference images not displayed]

FINDINGS: Trachea is midline. Heart is at the upper limits of normal in size.
There is mid and lower lung zone predominant interstitial prominence
and indistinctness. No pleural fluid.
IMPRESSION: Mid and lower lung zone predominant interstitial prominence and
indistinctness can be seen with COVID 19 pneumonia or pulmonary
edema. If there is concern for interstitial lung disease,
nonemergent high-resolution CT chest without contrast could be
performed.

## 2022-08-27 ENCOUNTER — Ambulatory Visit: Payer: Federal, State, Local not specified - PPO | Admitting: Internal Medicine

## 2022-08-27 ENCOUNTER — Encounter: Payer: Self-pay | Admitting: Internal Medicine

## 2022-08-27 VITALS — BP 138/86 | HR 50 | Temp 97.2°F | Ht 73.5 in | Wt 176.0 lb

## 2022-08-27 DIAGNOSIS — L97909 Non-pressure chronic ulcer of unspecified part of unspecified lower leg with unspecified severity: Secondary | ICD-10-CM | POA: Insufficient documentation

## 2022-08-27 DIAGNOSIS — I4811 Longstanding persistent atrial fibrillation: Secondary | ICD-10-CM

## 2022-08-27 DIAGNOSIS — I10 Essential (primary) hypertension: Secondary | ICD-10-CM | POA: Diagnosis not present

## 2022-08-27 DIAGNOSIS — I872 Venous insufficiency (chronic) (peripheral): Secondary | ICD-10-CM | POA: Diagnosis not present

## 2022-08-27 DIAGNOSIS — I83002 Varicose veins of unspecified lower extremity with ulcer of calf: Secondary | ICD-10-CM

## 2022-08-27 DIAGNOSIS — I83009 Varicose veins of unspecified lower extremity with ulcer of unspecified site: Secondary | ICD-10-CM | POA: Insufficient documentation

## 2022-08-27 DIAGNOSIS — L97201 Non-pressure chronic ulcer of unspecified calf limited to breakdown of skin: Secondary | ICD-10-CM

## 2022-08-27 HISTORY — DX: Non-pressure chronic ulcer of unspecified part of unspecified lower leg with unspecified severity: I83.009

## 2022-08-27 MED ORDER — FUROSEMIDE 40 MG PO TABS: 40.0000 mg | ORAL_TABLET | Freq: Every day | ORAL | 3 refills | Status: DC

## 2022-08-27 NOTE — Assessment & Plan Note (Signed)
Rate is fine I don't think this is related to his edema

## 2022-08-27 NOTE — Assessment & Plan Note (Signed)
Will increase furosemide and hopefully it should all heal up

## 2022-08-27 NOTE — Patient Instructions (Signed)
Please take the furosemide 40mg  every day (hold if your weight goes below 168# in the morning). Continue to take the HCTZ every day

## 2022-08-27 NOTE — Assessment & Plan Note (Addendum)
Worse now Doesn't seem to be in CHF---but may want to check echo at some point Will try furosemide 40mg   daily for now---can hold if home weight under 168#

## 2022-08-27 NOTE — Assessment & Plan Note (Signed)
Discussed that he needs to stay on the HCTZ daily--regardless of the furosemide

## 2022-08-27 NOTE — Progress Notes (Signed)
Subjective:    Patient ID: Damon Torres, male    DOB: Mar 23, 1941, 82 y.o.   MRN: [id number]  HPI Here due to worsening leg swelling  Has noticed it for the past 1-2 months and worsening Had been just ankle puffiness--but now going up the leg Doesn't add salt  Has furosemide --but hasn't been taking much Hasn't noticed diuresis after taking Has HCTZ but was holding when he took the furosemide Legs some better in the morning  Usually wears support socks--not much help  No SOB Sleeps flat in bed--no PND Exercises [facility]--[facility] [employer] ([facility], etc[facility]-----no change in exercise tolerance No chest pain No palpitations No SOB  Has open area in back of lower left calf Has had some fluid leakage--but not inflamed  Current Outpatient Medications on File Prior to Visit  Medication Sig Dispense Refill   benazepril (LOTENSIN) 40 MG tablet Take 40 mg by mouth daily.     budesonide (ENTOCORT EC) 3 MG 24 hr capsule      calcium-vitamin D (OSCAL WITH D) 500-200 MG-UNIT per tablet Take 1 tablet by mouth daily with breakfast.     furosemide (LASIX) 20 MG tablet Take 1 tablet (20 mg total) by mouth daily as needed. 30 tablet 1   HUMIRA PEN 40 MG/0.4ML PNKT Inject 40 mg into the skin every 14 (fourteen) days.     hydrochlorothiazide (HYDRODIURIL) 25 MG tablet Take 1 tablet (25 mg total) by mouth daily. 90 tablet 3   ketoconazole (NIZORAL) 2 % shampoo Apply topically 2 (two) times a week. 120 mL 3   levothyroxine (SYNTHROID) 75 MCG tablet TAKE 1 TABLET DAILY BEFORE BREAKFAST **MFR MYLAN** 90 tablet 3   lipase/protease/amylase (CREON) 36000 UNITS CPEP capsule TAKE 4 CAPSULES 3 TIMES A  DAY BEFORE MEALS. TAKE UP  TO 2 CAPSULES BEFORE ALL  SNACKS 1620 capsule 3   Omega-3 Fatty Acids (FISH OIL) 1000 MG CAPS Take 1 capsule by mouth daily.     rivaroxaban (XARELTO) 20 MG TABS tablet Take 20 mg by mouth daily with supper.     No current facility-administered medications on file prior to visit.    No  Known Allergies  Past Medical History:  Diagnosis Date   AAA (abdominal aortic aneurysm) ([place]) 12/16   4cm infrarenal   Atrial fibrillation ([place]) 11/14   Collagen vascular disease ([facility])    Hypertension    Pancreatic insufficiency    Rectal cancer ([place]) 2000   At [facility] -stage 3. RT and chemo also   Rheumatoid arthritis(714.0)    Subclinical hypothyroidism 2014    Past Surgical History:  Procedure Laterality Date   CATARACT EXTRACTION W/PHACO Right 02/14/2016   Procedure: CATARACT EXTRACTION PHACO AND INTRAOCULAR LENS PLACEMENT ([place]);  Surgeon: Birder Robson, MD;  Location: [facility] ORS;  Service: Ophthalmology;  Laterality: Right;  [place] 01:02 AP% 18.7 CDE 11.59 Fluid pack lot # [id number]   CATARACT EXTRACTION W/PHACO Left 02/07/2021   Procedure: CATARACT EXTRACTION PHACO AND INTRAOCULAR LENS PLACEMENT ([place]) LEFT;  Surgeon: Birder Robson, MD;  Location: [place];  Service: Ophthalmology;  Laterality: Left;  9.78 0:49.8   COLONOSCOPY     COLONOSCOPY WITH PROPOFOL N/A 01/27/2015   Procedure: COLONOSCOPY WITH PROPOFOL;  Surgeon: Josefine Class, MD;  Location: [facility] ENDOSCOPY;  Service: Endoscopy;  Laterality: N/A;   Right foot surgery Right 2/14   toes pinned   TRANSANAL RECTAL RESECTION  2000   [place]    Family  History  Problem Relation Age of Onset   Liver cancer Mother    Breast cancer Mother    Heart disease Mother    Heart disease Brother        CABG and valve repair   Hypertension Brother    Colon cancer Neg Hx    Esophageal cancer Neg Hx    Rectal cancer Neg Hx    Stomach cancer Neg Hx    Diabetes Neg Hx     Social History   Socioeconomic History   Marital status: Widowed    Spouse name: Not on file   Number of children: 2   Years of education: Not on file   Highest education level: Not on file  Occupational History   Occupation: Retired Charity fundraiser for [employer]    Comment: 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  time  Tobacco Use   Smoking status: Former    Passive exposure: Past   Smokeless tobacco: Never  Substance and Sexual Activity   Alcohol use: No   Drug use: No   Sexual activity: Not on file  Other Topics Concern   Not on file  Social History Narrative   Widowed 5/20   Now has living will   Daughter is health care POA   Would accept resuscitation attempts   Would not want tube feeds if cognitively unaware   Social Determinants of Health   Financial Resource Strain: Not on file  Food Insecurity: Not on file  Transportation Needs: Not on file  Physical Activity: Not on file  Stress: Not on file  Social Connections: Not on file  Intimate Partner Violence: Not on file   Review of Systems Appetite is fine Weight is stable    Objective:   Physical Exam Constitutional:      Appearance: Normal appearance.  Cardiovascular:     Rate and Rhythm: Normal rate. Rhythm irregular.     Heart sounds: No murmur heard.    No gallop.  Pulmonary:     Effort: Pulmonary effort is normal.     Breath sounds: Normal breath sounds. No wheezing or rales.  Musculoskeletal:     Cervical back: Neck supple.     Comments: 1-2+ pitting edema in ankles  Lymphadenopathy:     Cervical: No cervical adenopathy.  Skin:    Comments: Superficial ulcerations left lower calf One slight fluid blister (2-58mm) No inflammation  Neurological:     Mental Status: He is alert.            Assessment & Plan:

## 2022-09-05 ENCOUNTER — Other Ambulatory Visit (INDEPENDENT_AMBULATORY_CARE_PROVIDER_SITE_OTHER): Payer: Self-pay | Admitting: Nurse Practitioner

## 2022-09-05 DIAGNOSIS — I7143 Infrarenal abdominal aortic aneurysm, without rupture: Secondary | ICD-10-CM

## 2022-09-10 ENCOUNTER — Encounter: Payer: Self-pay | Admitting: Internal Medicine

## 2022-09-10 ENCOUNTER — Ambulatory Visit: Payer: Federal, State, Local not specified - PPO | Admitting: Internal Medicine

## 2022-09-10 VITALS — BP 138/84 | HR 50 | Temp 97.2°F | Ht 73.5 in | Wt 172.0 lb

## 2022-09-10 DIAGNOSIS — I83002 Varicose veins of unspecified lower extremity with ulcer of calf: Secondary | ICD-10-CM | POA: Diagnosis not present

## 2022-09-10 DIAGNOSIS — N1832 Chronic kidney disease, stage 3b: Secondary | ICD-10-CM

## 2022-09-10 DIAGNOSIS — I872 Venous insufficiency (chronic) (peripheral): Secondary | ICD-10-CM

## 2022-09-10 DIAGNOSIS — L97201 Non-pressure chronic ulcer of unspecified calf limited to breakdown of skin: Secondary | ICD-10-CM

## 2022-09-10 DIAGNOSIS — I4811 Longstanding persistent atrial fibrillation: Secondary | ICD-10-CM

## 2022-09-10 LAB — RENAL FUNCTION PANEL
Albumin: 3.9 g/dL (ref 3.5–5.2)
BUN: 54 mg/dL — ABNORMAL HIGH (ref 6–23)
CO2: 29 mEq/L (ref 19–32)
Calcium: 9.5 mg/dL (ref 8.4–10.5)
Chloride: 101 mEq/L (ref 96–112)
Creatinine, Ser: 1.88 mg/dL — ABNORMAL HIGH (ref 0.40–1.50)
GFR: 32.97 mL/min — ABNORMAL LOW (ref >60.00)
Glucose, Bld: 87 mg/dL (ref 70–99)
Phosphorus: 3.2 mg/dL (ref 2.3–4.6)
Potassium: 4 mEq/L (ref 3.5–5.1)
Sodium: 138 mEq/L (ref 135–145)

## 2022-09-10 NOTE — Assessment & Plan Note (Signed)
Presumed cause of the edema--but not much better on the regular furosemide Will check echo to be EF is normal Asked him to retry the support socks

## 2022-09-10 NOTE — Assessment & Plan Note (Signed)
Expect that GFR may go down some on the furosemide Continues on the benazepril

## 2022-09-10 NOTE — Assessment & Plan Note (Signed)
Small and non infected Discussed that the support hose may help

## 2022-09-10 NOTE — Progress Notes (Signed)
Subjective:    Patient ID: Damon Torres, male    DOB: 1940/07/04, 82 y.o.   MRN: [id number]  HPI Here for follow up of edema  Feet are better but still some swelling Up the calf some One open area--slight fluid drainage Some pain Not really using the support socks---had seen a difference in the past  No SOB Sleeps flat without PND Does do exercise classes---no DOE No change in fitness  No chest pain  Last GFR 40  Current Outpatient Medications on File Prior to Visit  Medication Sig Dispense Refill   benazepril (LOTENSIN) 40 MG tablet Take 40 mg by mouth daily.     budesonide (ENTOCORT EC) 3 MG 24 hr capsule      calcium-vitamin D (OSCAL WITH D) 500-200 MG-UNIT per tablet Take 1 tablet by mouth daily with breakfast.     furosemide (LASIX) 40 MG tablet Take 1 tablet (40 mg total) by mouth daily. 90 tablet 3   HUMIRA PEN 40 MG/0.4ML PNKT Inject 40 mg into the skin every 14 (fourteen) days.     hydrochlorothiazide (HYDRODIURIL) 25 MG tablet Take 1 tablet (25 mg total) by mouth daily. 90 tablet 3   ketoconazole (NIZORAL) 2 % shampoo Apply topically 2 (two) times a week. 120 mL 3   levothyroxine (SYNTHROID) 75 MCG tablet TAKE 1 TABLET DAILY BEFORE BREAKFAST **MFR MYLAN** 90 tablet 3   lipase/protease/amylase (CREON) 36000 UNITS CPEP capsule TAKE 4 CAPSULES 3 TIMES A  DAY BEFORE MEALS. TAKE UP  TO 2 CAPSULES BEFORE ALL  SNACKS 1620 capsule 3   Omega-3 Fatty Acids (FISH OIL) 1000 MG CAPS Take 1 capsule by mouth daily.     rivaroxaban (XARELTO) 20 MG TABS tablet Take 20 mg by mouth daily with supper.     No current facility-administered medications on file prior to visit.    No Known Allergies  Past Medical History:  Diagnosis Date   AAA (abdominal aortic aneurysm) 12/16   4cm infrarenal   Atrial fibrillation 11/14   Collagen vascular disease    Hypertension    Pancreatic insufficiency    Rectal cancer 2000   At [facility] -stage 3. RT and chemo also   Rheumatoid  arthritis(714.0)    Subclinical hypothyroidism 2014    Past Surgical History:  Procedure Laterality Date   CATARACT EXTRACTION W/PHACO Right 02/14/2016   Procedure: CATARACT EXTRACTION PHACO AND INTRAOCULAR LENS PLACEMENT ([facility]);  Surgeon: Galen Manila, MD;  Location: [facility] ORS;  Service: Ophthalmology;  Laterality: Right;  [place] 01:02 AP% 18.7 CDE 11.59 Fluid pack lot # [id number]   CATARACT EXTRACTION W/PHACO Left 02/07/2021   Procedure: CATARACT EXTRACTION PHACO AND INTRAOCULAR LENS PLACEMENT ([facility]) LEFT;  Surgeon: Galen Manila, MD;  Location: [facility];  Service: Ophthalmology;  Laterality: Left;  9.78 0:49.8   COLONOSCOPY     COLONOSCOPY WITH PROPOFOL N/A 01/27/2015   Procedure: COLONOSCOPY WITH PROPOFOL;  Surgeon: Elnita Maxwell, MD;  Location: [facility] ENDOSCOPY;  Service: Endoscopy;  Laterality: N/A;   Right foot surgery Right 2/14   toes pinned   TRANSANAL RECTAL RESECTION  2000   [place]   VARICOSE VEIN SURGERY  1979    Family History  Problem Relation Age of Onset   Liver cancer Mother    Breast cancer Mother    Heart disease Mother    Heart disease Brother        CABG and valve repair   Hypertension Brother    Colon cancer Neg Hx  Esophageal cancer Neg Hx    Rectal cancer Neg Hx    Stomach cancer Neg Hx    Diabetes Neg Hx     Social History   Socioeconomic History   Marital status: Widowed    Spouse name: Not on file   Number of children: 2   Years of education: Not on file   Highest education level: Not on file  Occupational History   Occupation: Retired Event organiser for [employer]    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  Tobacco Use   Smoking status: Former    Passive exposure: Past   Smokeless tobacco: Never  Substance and Sexual Activity   Alcohol use: No   Drug use: No   Sexual activity: Not on file  Other Topics Concern   Not on file  Social History Narrative   Widowed 5/20   Now has living will   Daughter is health  care POA   Would accept resuscitation attempts   Would not want tube feeds if cognitively unaware   Social Determinants of Health   Financial Resource Strain: Not on file  Food Insecurity: Not on file  Transportation Needs: Not on file  Physical Activity: Not on file  Stress: Not on file  Social Connections: Not on file  Intimate Partner Violence: Not on file   Review of Systems Stays away from salt Feet are better in the morning Taking the HCTZ daily     Objective:   Physical Exam Constitutional:      Appearance: Normal appearance.  Cardiovascular:     Rate and Rhythm: Normal rate and regular rhythm.     Heart sounds: No murmur heard.    No gallop.     Comments: Frequent ectopy Faint pedal pulses Pulmonary:     Effort: Pulmonary effort is normal.     Breath sounds: Normal breath sounds. No wheezing or rales.  Musculoskeletal:     Cervical back: Neck supple.     Comments: Still with 1+ pitting edema into lower calves (8AM)  Lymphadenopathy:     Cervical: No cervical adenopathy.  Skin:    Comments: Small non infected superficial ulcer posterior lower left calf  Neurological:     Mental Status: He is alert.            Assessment & Plan:

## 2022-09-10 NOTE — Assessment & Plan Note (Signed)
Hard to tell if atrial fib or regular with frequent ectopy Is on the xarelto Does have cardiology follow up

## 2022-09-18 ENCOUNTER — Ambulatory Visit (INDEPENDENT_AMBULATORY_CARE_PROVIDER_SITE_OTHER): Payer: Federal, State, Local not specified - PPO

## 2022-09-18 ENCOUNTER — Encounter (INDEPENDENT_AMBULATORY_CARE_PROVIDER_SITE_OTHER): Payer: Self-pay | Admitting: Vascular Surgery

## 2022-09-18 ENCOUNTER — Ambulatory Visit (INDEPENDENT_AMBULATORY_CARE_PROVIDER_SITE_OTHER): Payer: Federal, State, Local not specified - PPO | Admitting: Vascular Surgery

## 2022-09-18 VITALS — BP 130/74 | HR 56 | Resp 16 | Wt 171.0 lb

## 2022-09-18 DIAGNOSIS — I739 Peripheral vascular disease, unspecified: Secondary | ICD-10-CM | POA: Diagnosis not present

## 2022-09-18 DIAGNOSIS — I7143 Infrarenal abdominal aortic aneurysm, without rupture: Secondary | ICD-10-CM

## 2022-09-18 DIAGNOSIS — E782 Mixed hyperlipidemia: Secondary | ICD-10-CM | POA: Diagnosis not present

## 2022-09-18 DIAGNOSIS — I10 Essential (primary) hypertension: Secondary | ICD-10-CM

## 2022-09-18 DIAGNOSIS — I7133 Infrarenal abdominal aortic aneurysm, ruptured: Secondary | ICD-10-CM

## 2022-09-18 NOTE — Progress Notes (Signed)
MRN : [id number]  Damon Torres is a 82 y.o. (04-15-1941) male who presents with chief complaint of  Chief Complaint  Patient presents with   Follow-up    Ultrasound follow up  .  History of Present Illness: Patient returns today in follow up of his abdominal aortic aneurysm.  He is doing well today without any new complaints.  He denies any aneurysm related symptoms. Specifically, the patient denies new back or abdominal pain, or signs of peripheral embolization.  Duplex today shows continued growth of his abdominal aortic aneurysm now measuring 5.0 cm in maximal diameter.  Current Outpatient Medications  Medication Sig Dispense Refill   benazepril (LOTENSIN) 40 MG tablet Take 40 mg by mouth daily.     budesonide (ENTOCORT EC) 3 MG 24 hr capsule      calcium-vitamin D (OSCAL WITH D) 500-200 MG-UNIT per tablet Take 1 tablet by mouth daily with breakfast.     furosemide (LASIX) 40 MG tablet Take 1 tablet (40 mg total) by mouth daily. 90 tablet 3   HUMIRA PEN 40 MG/0.4ML PNKT Inject 40 mg into the skin every 14 (fourteen) days.     hydrochlorothiazide (HYDRODIURIL) 25 MG tablet Take 1 tablet (25 mg total) by mouth daily. 90 tablet 3   ketoconazole (NIZORAL) 2 % shampoo Apply topically 2 (two) times a week. 120 mL 3   levothyroxine (SYNTHROID) 75 MCG tablet TAKE 1 TABLET DAILY BEFORE BREAKFAST **MFR MYLAN** 90 tablet 3   Omega-3 Fatty Acids (FISH OIL) 1000 MG CAPS Take 1 capsule by mouth daily.     rivaroxaban (XARELTO) 20 MG TABS tablet Take 20 mg by mouth daily with supper.     benazepril (LOTENSIN) 40 MG tablet Take 40 mg by mouth daily.     furosemide (LASIX) 40 MG tablet Take 1 tablet (40 mg total) by mouth daily. 90 tablet 3   hydrochlorothiazide (HYDRODIURIL) 25 MG tablet Take 1 tablet (25 mg total) by mouth daily. 90 tablet 3   levothyroxine (SYNTHROID) 75 MCG tablet TAKE 1 TABLET DAILY BEFORE BREAKFAST **MFR MYLAN** 90 tablet 3   lipase/protease/amylase (CREON) 36000 UNITS CPEP  capsule TAKE 4 CAPSULES 3 TIMES A  DAY BEFORE MEALS. TAKE UP  TO 2 CAPSULES BEFORE ALL  SNACKS 1620 capsule 3   No current facility-administered medications for this visit.    Past Medical History:  Diagnosis Date   AAA (abdominal aortic aneurysm) 12/16   4cm infrarenal   Atrial fibrillation 11/14   Collagen vascular disease    Hypertension    Pancreatic insufficiency    Rectal cancer 2000   At [facility] -stage 3. RT and chemo also   Rheumatoid arthritis(714.0)    Subclinical hypothyroidism 2014    Past Surgical History:  Procedure Laterality Date   CATARACT EXTRACTION W/PHACO Right 02/14/2016   Procedure: CATARACT EXTRACTION PHACO AND INTRAOCULAR LENS PLACEMENT (IOC);  Surgeon: Galen Manila, MD;  Location: [facility] ORS;  Service: Ophthalmology;  Laterality: Right;  [place] 01:02 AP% 18.7 CDE 11.59 Fluid pack lot # [id number]   CATARACT EXTRACTION W/PHACO Left 02/07/2021   Procedure: CATARACT EXTRACTION PHACO AND INTRAOCULAR LENS PLACEMENT (IOC) LEFT;  Surgeon: Galen Manila, MD;  Location: [facility];  Service: Ophthalmology;  Laterality: Left;  9.78 0:49.8   COLONOSCOPY     COLONOSCOPY WITH PROPOFOL N/A 01/27/2015   Procedure: COLONOSCOPY WITH PROPOFOL;  Surgeon: Elnita Maxwell, MD;  Location: [facility] ENDOSCOPY;  Service: Endoscopy;  Laterality: N/A;   Right foot surgery Right  2/14   toes pinned   TRANSANAL RECTAL RESECTION  2000   [facility]   VARICOSE VEIN SURGERY  1979     Social History   Tobacco Use   Smoking status: Former    Passive exposure: Past   Smokeless tobacco: Never  Substance Use Topics   Alcohol use: No   Drug use: No      Family History  Problem Relation Age of Onset   Liver cancer Mother    Breast cancer Mother    Heart disease Mother    Heart disease Brother        CABG and valve repair   Hypertension Brother    Colon cancer Neg Hx    Esophageal cancer Neg Hx    Rectal cancer Neg Hx    Stomach cancer Neg Hx    Diabetes Neg Hx     No  Known Allergies   REVIEW OF SYSTEMS (Negative unless checked)  Constitutional: [] Weight loss  [] Fever  [] Chills Cardiac: [] Chest pain   [] Chest pressure   [x] Palpitations   [] Shortness of breath when laying flat   [] Shortness of breath at rest   [] Shortness of breath with exertion. Vascular:  [] Pain in legs with walking   [] Pain in legs at rest   [] Pain in legs when laying flat   [] Claudication   [] Pain in feet when walking  [] Pain in feet at rest  [] Pain in feet when laying flat   [] History of DVT   [] Phlebitis   [] Swelling in legs   [] Varicose veins   [] Non-healing ulcers Pulmonary:   [] Uses home oxygen   [] Productive cough   [] Hemoptysis   [] Wheeze  [] COPD   [] Asthma Neurologic:  [] Dizziness  [] Blackouts   [] Seizures   [] History of stroke   [] History of TIA  [] Aphasia   [] Temporary blindness   [] Dysphagia   [] Weakness or numbness in arms   [] Weakness or numbness in legs Musculoskeletal:  [x] Arthritis   [] Joint swelling   [x] Joint pain   [] Low back pain Hematologic:  [] Easy bruising  [] Easy bleeding   [] Hypercoagulable state   [] Anemic   Gastrointestinal:  [] Blood in stool   [] Vomiting blood  [] Gastroesophageal reflux/heartburn   [] Abdominal pain Genitourinary:  [] Chronic kidney disease   [] Difficult urination  [] Frequent urination  [] Burning with urination   [] Hematuria Skin:  [] Rashes   [] Ulcers   [] Wounds Psychological:  [] History of anxiety   []  History of major depression.  Physical Examination  BP 130/74 (BP Location: Left Arm)   Pulse (!) 56   Resp 16   Wt 171 lb (77.6 kg)   BMI 22.25 kg/m  Gen:  WD/WN, NAD. Appears younger than stated age. Head: [place]/AT, No temporalis wasting. Ear/Nose/Throat: Hearing grossly intact, nares w/o erythema or drainage Eyes: Conjunctiva clear. Sclera non-icteric Neck: Supple.  Trachea midline Pulmonary:  Good air movement, no use of accessory muscles.  Cardiac: RRR, no JVD Vascular:  Vessel Right Left  Radial Palpable Palpable                           PT Palpable Palpable  DP Palpable Palpable   Gastrointestinal: soft, non-tender/non-distended. No guarding/reflex. Increased aortic impulse Musculoskeletal: M/S 5/5 throughout.  No deformity or atrophy. No edema. Neurologic: Sensation grossly intact in extremities.  Symmetrical.  Speech is fluent.  Psychiatric: Judgment intact, Mood & affect appropriate for pt's clinical situation. Dermatologic: No rashes or ulcers noted.  No cellulitis or open wounds.  Labs Recent Results (from the past 2160 hour(s))  Renal function panel     Status: Abnormal   Collection Time: 06/26/22  8:00 AM  Result Value Ref Range   Sodium 141 135 - 145 mEq/L   Potassium 4.7 3.5 - 5.1 mEq/L   Chloride 104 96 - 112 mEq/L   CO2 27 19 - 32 mEq/L   Albumin 3.9 3.5 - 5.2 g/dL   BUN 46 (H) 6 - 23 mg/dL   Creatinine, Ser 6.21 (H) 0.40 - 1.50 mg/dL   Glucose, Bld 86 70 - 99 mg/dL   Phosphorus 3.0 2.3 - 4.6 mg/dL   GFR 30.86 (L) >57.84 mL/min    Comment: Calculated using the CKD-EPI Creatinine Equation (2021)   Calcium 9.4 8.4 - 10.5 mg/dL  TSH     Status: None   Collection Time: 06/26/22  8:00 AM  Result Value Ref Range   TSH 4.43 0.35 - 5.50 uIU/mL  T4, free     Status: None   Collection Time: 06/26/22  8:00 AM  Result Value Ref Range   Free T4 1.09 0.60 - 1.60 ng/dL    Comment: Specimens from patients who are undergoing biotin therapy and /or ingesting biotin supplements may contain high levels of biotin.  The higher biotin concentration in these specimens interferes with this Free T4 assay.  Specimens that contain high levels  of biotin may cause false high results for this Free T4 assay.  Please interpret results in light of the total clinical presentation of the patient.    Renal function panel     Status: Abnormal   Collection Time: 09/10/22  8:24 AM  Result Value Ref Range   Sodium 138 135 - 145 mEq/L   Potassium 4.0 3.5 - 5.1 mEq/L   Chloride 101 96 - 112 mEq/L   CO2 29 19 - 32  mEq/L   Albumin 3.9 3.5 - 5.2 g/dL   BUN 54 (H) 6 - 23 mg/dL   Creatinine, Ser 6.96 (H) 0.40 - 1.50 mg/dL   Glucose, Bld 87 70 - 99 mg/dL   Phosphorus 3.2 2.3 - 4.6 mg/dL   GFR 29.52 (L) >84.13 mL/min    Comment: Calculated using the CKD-EPI Creatinine Equation (2021)   Calcium 9.5 8.4 - 10.5 mg/dL    Radiology No results found.  Assessment/Plan Hyperlipidemia, mixed lipid control important in reducing the progression of atherosclerotic disease. Continue statin therapy     Essential hypertension blood pressure control important in reducing the progression of atherosclerotic disease. On appropriate oral medications.   Peripheral vascular obstructive disease (HCC) ABIs were normal with good digit pressures and waveforms.  No role for intervention.  Can be checked every few years.  AAA (abdominal aortic aneurysm) (HCC) Duplex today shows continued growth of his abdominal aortic aneurysm now measuring 5.0 cm in maximal diameter. I had a discussion with the patient today regarding the pathophysiology and natural history of aneurysmal disease.  He has now reached the threshold of 5.0 cm for consideration for repair to prevent lethal rupture.  I have recommended a CT angiogram be performed to help delineate the anatomy to ensure that he is a stent graft candidate and to help with the size of our stent graft for placement.  The patient asks if he can wait a few months to have this performed as he is does not want to do it this summer.  I discussed that that is reasonable but not without some degree of risk.  The risk  is relatively low as his yearly risk of rupture is only 2% range at this point.  He is willing to accept that risk.  He also has an appointment with his cardiologist later this summer and that will be helpful in getting cardiac clearance.  His cardiologist is through [facility].  I am going to go ahead and order a CT angiogram and plan to see him back in about 3 months at his request we can  go over the results and plan his repair at that time.    Festus Barren, MD  09/18/2022 9:32 AM    This note was created with Dragon medical transcription system.  Any errors from dictation are purely unintentional

## 2022-09-18 NOTE — Patient Instructions (Signed)
Abdominal Aortic Aneurysm  An aneurysm is a bulge in an artery. An artery is a blood vessel that carries blood away from your heart. An abdominal aortic aneurysm happens in the main artery (aorta). Most aneurysms do not cause symptoms. But some can cause problems if they burst or tear. This can cause you to bleed inside your body. It is an emergency. It should be treated right away. What are the causes? The exact cause of an aneurysm is not known. What increases the risk? Being male and 60 years of age or older. Being Caucasian. Smoking. Being very overweight. Having: Family members who have had this condition. An injury to your main artery. Hardened arteries. Irritation and swelling of the walls of an artery. Certain genetic problems. An infection in the wall of the main artery. High cholesterol. High blood pressure. What are the signs or symptoms? You may not have any symptoms. If you do, you may have: Very bad pain in your side, back, or belly (abdomen). A feeling of fullness after you eat only a little bit of food. A lump in your belly that throbs. Problems with your feet or toes, like: Pain. Skin turning blue. Sores. Trouble pooping (constipation). Trouble peeing (urinating). If your aneurysm bursts, you may: Feel sudden, very bad pain in your belly, side, or back. Vomit or feel like you may vomit. Feel light-headed. Faint. How is this treated? Treatment depends on: Your age. How big your aneurysm is. How fast it is growing. How likely it is to burst. If the aneurysm is smaller than 2 inches (5 cm), your doctor may: Keep an eye on it to see if it gets bigger. You may need to have testing done every 6-12 months, every year, or every few years. Give you medicines for: High blood pressure. Pain. Infection. If the aneurysm is bigger than 2 inches (5 cm), you may need surgery. Follow these instructions at home: Eating and drinking  Eat foods that are good for your  heart. Eat a lot of: Fresh fruits and vegetables. Whole grains. Low-fat (lean) protein. Low-fat dairy products. Stay away from foods that are high in saturated fat and cholesterol. These include red meat and some dairy products. Lifestyle  Do not smoke or use any products that contain nicotine or tobacco. If you need help quitting, ask your doctor. Check your blood pressure often. Follow instructions from your doctor on how to keep it at a normal level. Have your cholesterol levels checked often. Follow instructions from your doctor on how to keep it at the right levels. Stay active. Get exercise. Ask your doctor how often to exercise. Ask what types of exercise are safe for you. Stay at a healthy weight. Alcohol use Do not drink alcohol if: Your doctor tells you not to drink. You are pregnant, may be pregnant, or plan to become pregnant. If you drink alcohol: Limit how much you have to: 0-1 drink a day if you are male. 0-2 drinks a day if you are male. Know how much alcohol is in your drink. In the [place] one drink is one 12 oz bottle of beer (355 mL), one 5 oz glass of wine (148 mL), or one 1 oz glass of hard liquor (44 mL). General instructions Take over-the-counter and prescription medicines only as told by your doctor. You may have to avoid lifting. Ask your doctor how much you can safely lift. If you can, learn your family's health history. Keep all follow-up visits. Your doctor will need   to keep an eye on your aneurysm and check how fast it is growing. Where to find more information American Heart Association: heart.org Contact a doctor if: Your belly, side, or back hurts. Your belly throbs. Your heart beats fast when you stand. You feel like you may vomit, or you vomit. You have trouble pooping. You have trouble peeing. You have a fever. Get help right away if: You have sudden, bad pain in your belly, side, or back. You feel light-headed, or you faint. You have  sweaty skin that is cold to the touch (clammy). You are short of breath. These symptoms may be an emergency. Get help right away. Call 911. Do not wait to see if the symptoms will go away. Do not drive yourself to the hospital. This information is not intended to replace advice given to you by your health care provider. Make sure you discuss any questions you have with your health care provider. Document Revised: 12/26/2021 Document Reviewed: 12/26/2021 Elsevier Patient Education  2023 Elsevier Inc.  

## 2022-09-18 NOTE — Assessment & Plan Note (Signed)
Duplex today shows continued growth of his abdominal aortic aneurysm now measuring 5.0 cm in maximal diameter. I had a discussion with the patient today regarding the pathophysiology and natural history of aneurysmal disease.  He has now reached the threshold of 5.0 cm for consideration for repair to prevent lethal rupture.  I have recommended a CT angiogram be performed to help delineate the anatomy to ensure that he is a stent graft candidate and to help with the size of our stent graft for placement.  The patient asks if he can wait a few months to have this performed as he is does not want to do it this summer.  I discussed that that is reasonable but not without some degree of risk.  The risk is relatively low as his yearly risk of rupture is only 2% range at this point.  He is willing to accept that risk.  He also has an appointment with his cardiologist later this summer and that will be helpful in getting cardiac clearance.  His cardiologist is through [facility].  I am going to go ahead and order a CT angiogram and plan to see him back in about 3 months at his request we can go over the results and plan his repair at that time.

## 2022-09-19 ENCOUNTER — Telehealth (INDEPENDENT_AMBULATORY_CARE_PROVIDER_SITE_OTHER): Payer: Self-pay | Admitting: Vascular Surgery

## 2022-09-19 ENCOUNTER — Encounter (INDEPENDENT_AMBULATORY_CARE_PROVIDER_SITE_OTHER): Payer: Self-pay | Admitting: Vascular Surgery

## 2022-09-19 NOTE — Telephone Encounter (Signed)
Spoke with pt regarding his insurance coverage. Verified that his [employer] Geradine Girt is primary to which patient answered yes. I advised that no prior Berkley Harvey is required for CPT 74174 (CT scan). I spoke with [facility] and no prior Berkley Harvey is required call ref # [id number]. I advised pt to call radiology scheduling at [phone] and schedule his CT and then call us back at the clinic to R/S his CT follow up that was scheduled for September. Pt acknowledged and I will await pt's call back.

## 2022-09-26 ENCOUNTER — Ambulatory Visit
Admission: RE | Admit: 2022-09-26 | Discharge: 2022-09-26 | Disposition: A | Discharge status: Home / Self Care | Payer: Federal, State, Local not specified - PPO | Source: Ambulatory Visit | Attending: Vascular Surgery | Admitting: Vascular Surgery

## 2022-09-26 DIAGNOSIS — I7133 Infrarenal abdominal aortic aneurysm, ruptured: Secondary | ICD-10-CM | POA: Diagnosis not present

## 2022-09-26 MED ORDER — IOHEXOL 350 MG/ML SOLN
80.0000 mL | Freq: Once | INTRAVENOUS | Status: AC | PRN
  Administered 2022-09-26: 80 mL via INTRAVENOUS

## 2022-09-27 ENCOUNTER — Ambulatory Visit: Payer: Federal, State, Local not specified - PPO | Admitting: Internal Medicine

## 2022-09-27 ENCOUNTER — Encounter: Payer: Self-pay | Admitting: Internal Medicine

## 2022-09-27 VITALS — BP 138/80 | HR 50 | Temp 97.9°F | Ht 73.5 in | Wt 168.0 lb

## 2022-09-27 DIAGNOSIS — I872 Venous insufficiency (chronic) (peripheral): Secondary | ICD-10-CM | POA: Diagnosis not present

## 2022-09-27 DIAGNOSIS — I714 Abdominal aortic aneurysm, without rupture, unspecified: Secondary | ICD-10-CM | POA: Diagnosis not present

## 2022-09-27 DIAGNOSIS — J988 Other specified respiratory disorders: Secondary | ICD-10-CM

## 2022-09-27 HISTORY — DX: Other specified respiratory disorders: J98.8

## 2022-09-27 MED ORDER — DOXYCYCLINE HYCLATE 100 MG PO TABS
100.0000 mg | ORAL_TABLET | Freq: Two times a day (BID) | ORAL | 0 refills | Status: DC
Start: 2022-09-27 — End: 2022-10-16

## 2022-09-27 NOTE — Assessment & Plan Note (Signed)
Likely secondary bacterial infection (sinus) given worsening after a week Discussed tylenol and OTC cough meds Will give doxy 100 bid x 7 days

## 2022-09-27 NOTE — Progress Notes (Signed)
Subjective:    Patient ID: Damon Torres, male    DOB: 1941/01/13, 82 y.o.   MRN: [id number]  HPI Here due to respiratory infection  Has been sick for a week--thought he was improving but now feels worse Feels his chest and head are more congested now No chills or sweats No SOB Lots of cough--especially last night. Mostly non productive (some trouble sleeping) No headache or ear pain No sore throat Has been blowing out clear stuff---coughs up darker stuff (green/yellow)  Has tried advil cold and sinus once--not much help  Current Outpatient Medications on File Prior to Visit  Medication Sig Dispense Refill   benazepril (LOTENSIN) 40 MG tablet Take 40 mg by mouth daily.     calcium-vitamin D (OSCAL WITH D) 500-200 MG-UNIT per tablet Take 1 tablet by mouth daily with breakfast.     HUMIRA PEN 40 MG/0.4ML PNKT Inject 40 mg into the skin every 14 (fourteen) days.     ketoconazole (NIZORAL) 2 % shampoo Apply topically 2 (two) times a week. 120 mL 3   levothyroxine (SYNTHROID) 75 MCG tablet TAKE 1 TABLET DAILY BEFORE BREAKFAST **MFR MYLAN** 90 tablet 3   lipase/protease/amylase (CREON) 36000 UNITS CPEP capsule TAKE 4 CAPSULES 3 TIMES A  DAY BEFORE MEALS. TAKE UP  TO 2 CAPSULES BEFORE ALL  SNACKS 1620 capsule 3   Omega-3 Fatty Acids (FISH OIL) 1000 MG CAPS Take 1 capsule by mouth daily.     rivaroxaban (XARELTO) 20 MG TABS tablet Take 20 mg by mouth daily with supper.     budesonide (ENTOCORT EC) 3 MG 24 hr capsule  (Patient not taking: Reported on 09/27/2022)     furosemide (LASIX) 40 MG tablet Take 1 tablet (40 mg total) by mouth daily. (Patient not taking: Reported on 09/27/2022) 90 tablet 3   hydrochlorothiazide (HYDRODIURIL) 25 MG tablet Take 1 tablet (25 mg total) by mouth daily. (Patient not taking: Reported on 09/27/2022) 90 tablet 3   No current facility-administered medications on file prior to visit.    No Known Allergies  Past Medical History:  Diagnosis Date   AAA  (abdominal aortic aneurysm) 12/16   4cm infrarenal   Atrial fibrillation 11/14   Collagen vascular disease    Hypertension    Pancreatic insufficiency    Rectal cancer 2000   At [facility] -stage 3. RT and chemo also   Rheumatoid arthritis(714.0)    Subclinical hypothyroidism 2014    Past Surgical History:  Procedure Laterality Date   CATARACT EXTRACTION W/PHACO Right 02/14/2016   Procedure: CATARACT EXTRACTION PHACO AND INTRAOCULAR LENS PLACEMENT (IOC);  Surgeon: Galen Manila, MD;  Location: [facility] ORS;  Service: Ophthalmology;  Laterality: Right;  [place] 01:02 AP% 18.7 CDE 11.59 Fluid pack lot # [id number]   CATARACT EXTRACTION W/PHACO Left 02/07/2021   Procedure: CATARACT EXTRACTION PHACO AND INTRAOCULAR LENS PLACEMENT (IOC) LEFT;  Surgeon: Galen Manila, MD;  Location: [facility];  Service: Ophthalmology;  Laterality: Left;  9.78 0:49.8   COLONOSCOPY     COLONOSCOPY WITH PROPOFOL N/A 01/27/2015   Procedure: COLONOSCOPY WITH PROPOFOL;  Surgeon: Elnita Maxwell, MD;  Location: [facility] END[facility];  Service: Endoscopy;  Laterality: N/A;   Right foot surgery Right 2/14   toes pinned   TRANSANAL RECTAL RESECTION  2000   [facility]   VARICOSE VEIN SURGERY  1979    Family History  Problem Relation Age of Onset   Liver cancer Mother    Breast cancer Mother    Heart disease  Mother    Heart disease Brother        CABG and valve repair   Hypertension Brother    Colon cancer Neg Hx    Esophageal cancer Neg Hx    Rectal cancer Neg Hx    Stomach cancer Neg Hx    Diabetes Neg Hx     Social History   Socioeconomic History   Marital status: Widowed    Spouse name: Not on file   Number of children: 2   Years of education: Not on file   Highest education level: Not on file  Occupational History   Occupation: Retired Event organiser for [employer]    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  Tobacco Use   Smoking status: Former    Passive exposure: Past   Smokeless  tobacco: Never  Substance and Sexual Activity   Alcohol use: No   Drug use: No   Sexual activity: Not on file  Other Topics Concern   Not on file  Social History Narrative   Widowed 5/20   Now has living will   Daughter is health care POA   Would accept resuscitation attempts   Would not want tube feeds if cognitively unaware   Social Determinants of Health   Financial Resource Strain: Not on file  Food Insecurity: Not on file  Transportation Needs: Not on file  Physical Activity: Not on file  Stress: Not on file  Social Connections: Not on file  Intimate Partner Violence: Not on file   Review of Systems No N/V Appetite is gone--but can eat No rash Weight down now---feet not swollen so much. Hasn't taken the furosemide in 6 days Getting aortic stent by Dr Wyn Quaker next month     Objective:   Physical Exam Constitutional:      Appearance: Normal appearance.     Comments: Slight hoarseness  HENT:     Head:     Comments: No sinus tenderness    Right Ear: Tympanic membrane and ear canal normal.     Left Ear: Tympanic membrane and ear canal normal.     Mouth/Throat:     Pharynx: No oropharyngeal exudate or posterior oropharyngeal erythema.  Pulmonary:     Effort: Pulmonary effort is normal.     Breath sounds: Normal breath sounds. No wheezing or rales.  Musculoskeletal:     Cervical back: Neck supple.     Right lower leg: No edema.     Left lower leg: No edema.  Lymphadenopathy:     Cervical: No cervical adenopathy.  Neurological:     Mental Status: He is alert.            Assessment & Plan:

## 2022-09-27 NOTE — Assessment & Plan Note (Signed)
Getting stent soon

## 2022-09-27 NOTE — Assessment & Plan Note (Signed)
Discussed using the furosemide 2-3 times a week--or when the swelling is worse

## 2022-10-02 ENCOUNTER — Ambulatory Visit (INDEPENDENT_AMBULATORY_CARE_PROVIDER_SITE_OTHER): Payer: Federal, State, Local not specified - PPO | Admitting: Vascular Surgery

## 2022-10-02 VITALS — BP 139/73 | HR 44 | Resp 17 | Ht 74.0 in | Wt 171.0 lb

## 2022-10-02 DIAGNOSIS — I10 Essential (primary) hypertension: Secondary | ICD-10-CM

## 2022-10-02 DIAGNOSIS — I7143 Infrarenal abdominal aortic aneurysm, without rupture: Secondary | ICD-10-CM | POA: Diagnosis not present

## 2022-10-02 DIAGNOSIS — I739 Peripheral vascular disease, unspecified: Secondary | ICD-10-CM | POA: Diagnosis not present

## 2022-10-02 DIAGNOSIS — E782 Mixed hyperlipidemia: Secondary | ICD-10-CM

## 2022-10-02 NOTE — Assessment & Plan Note (Signed)
Since his last visit, he has undergone a CT angiogram which I have independently reviewed.  This demonstrates an approximately 5.3 cm infrarenal abdominal aortic aneurysm with mild ectasia of the iliac arteries.  He has a fairly long infrarenal neck with multiple renal arteries bilaterally.  No evidence of extravasation or dissection.  Recommend:  The aneurysm is > 5 cm and therefore should undergo repair. Patient is status post CT scan of the abdominal aorta. The patient is a candidate for endovascular repair.   The patient will require cardiac clearance prior to stent graft placement.   The patient will continue antiplatelet therapy as prescribed (since the patient is undergoing endovascular repair as opposed to open repair) as well as aggressive management of hyperlipidemia. Exercise is again strongly encouraged.   The patient is reminded that lifetime routine surveillance is a necessity with an endograft.   The risks and benefits of AAA repair are reviewed with the patient.  All questions are answered.  Alternative therapies are also discussed.  The patient agrees to proceed with endovascular aneurysm repair.  Patient will follow-up with me in the office after the surgery.

## 2022-10-02 NOTE — Patient Instructions (Signed)
Abdominal Aortic Aneurysm Endograft Repair, Care After After abdominal aortic aneurysm endograft repair it is common to have pain or soreness at the incision site. You may also have tiredness (fatigue). Follow these instructions at home: Medicines Take over-the-counter and prescription medicines only as told by your health care provider. If you were prescribed antibiotics, take them as told by your provider. Do not stop using the antibiotic even if you start to feel better. Incision care  Follow instructions from your provider about how to take care of your incisions. Make sure you: Wash your hands with soap and water for at least 20 seconds before and after you change your bandage (dressing). If soap and water are not available, use hand sanitizer. Change your dressing as told by your health care provider. Leave stitches (sutures), skin glue, or tape strips in place. These skin closures may need to stay in place for 2 weeks or longer. If tape strip edges start to loosen and curl up, you may trim the loose edges. Do not remove tape strips completely unless your provider tells you to do that. Check your incision area every day for signs of infection. Check for: Redness, swelling, or more pain. Fluid or blood. Warmth. Pus or a bad smell. Keep the incision area clean and dry. Check your incisions for a lump or swelling. These can be signs of bleeding at your incision site. Activity Rest as told by your provider. Do not sit for a long time without moving. Get up to take short walks every 1-2 hours. This will improve blood flow and breathing. Ask for help if you feel weak or unsteady. You may have to avoid lifting. Ask your provider how much you can safely lift. Do not drive or operate machinery until your provider says that it is safe. Return to your normal activities as told by your provider. Ask your provider what activities are safe for you. Lifestyle  Do not use any products that contain  nicotine or tobacco. These products include cigarettes, chewing tobacco, and vaping devices, such as e-cigarettes. These can delay incision healing after surgery. If you need help quitting, ask your provider. Make any lifestyle changes that your provider suggests. You may need to: Keep your blood pressure under control. Find ways to lower stress. Eat healthy foods that are good for your heart. These include vegetables, fruits, and whole grains that add fiber to your diet. Get regular exercise once your provider tells you it is safe to do so. General instructions Do not take baths, swim, or use a hot tub until your provider approves. Ask your provider if you may take showers. You may only be allowed to take sponge baths. Drink enough fluid to keep your pee (urine) pale yellow. Wear compression stockings as told by your provider. These stockings help to prevent blood clots and reduce swelling in your legs. Keep all follow-up visits. Your provider will need to monitor your healing and make sure the graft is working like it should. Your health care provider may give you more instructions. Make sure you know what you can and cannot do. Contact a health care provider if: You have pain in your abdomen, chest, legs, or back. You have any signs of infection. You have a fever. Get help right away if: You have trouble breathing. You have sudden pain in your legs, or you have trouble moving either of your legs. You feel light-headed, or you faint. These symptoms may be an emergency. Get help right away. Call   911. Do not wait to see if the symptoms will go away. Do not drive yourself to the hospital. This information is not intended to replace advice given to you by your health care provider. Make sure you discuss any questions you have with your health care provider. Document Revised: 12/26/2021 Document Reviewed: 12/26/2021 Elsevier Patient Education  2023 Elsevier Inc.  

## 2022-10-02 NOTE — Progress Notes (Signed)
MRN : [id number]  Damon Torres is a 82 y.o. (1941/01/08) male who presents with chief complaint of  Chief Complaint  Patient presents with   Follow-up  .  History of Present Illness: Patient returns today in follow up of his abdominal aortic aneurysm.  Since his last visit, he has undergone a CT angiogram which I have independently reviewed.  This demonstrates an approximately 5.3 cm infrarenal abdominal aortic aneurysm with mild ectasia of the iliac arteries.  He has a fairly long infrarenal neck with multiple renal arteries bilaterally.  No evidence of extravasation or dissection.  Also since his last visit, he has had discussions with his family and would like to go ahead and get this aneurysm appears soon as possible.  He has not yet seen his cardiologist, but he is scheduled for an echocardiogram next week ordered by his primary care physician.  Current Outpatient Medications  Medication Sig Dispense Refill   benazepril (LOTENSIN) 40 MG tablet Take 40 mg by mouth daily.     calcium-vitamin D (OSCAL WITH D) 500-200 MG-UNIT per tablet Take 1 tablet by mouth daily with breakfast.     doxycycline (VIBRA-TABS) 100 MG tablet Take 1 tablet (100 mg total) by mouth 2 (two) times daily. 14 tablet 0   HUMIRA PEN 40 MG/0.4ML PNKT Inject 40 mg into the skin every 14 (fourteen) days.     ketoconazole (NIZORAL) 2 % shampoo Apply topically 2 (two) times a week. 120 mL 3   levothyroxine (SYNTHROID) 75 MCG tablet TAKE 1 TABLET DAILY BEFORE BREAKFAST **MFR MYLAN** 90 tablet 3   lipase/protease/amylase (CREON) 36000 UNITS CPEP capsule TAKE 4 CAPSULES 3 TIMES A  DAY BEFORE MEALS. TAKE UP  TO 2 CAPSULES BEFORE ALL  SNACKS 1620 capsule 3   Omega-3 Fatty Acids (FISH OIL) 1000 MG CAPS Take 1 capsule by mouth daily.     rivaroxaban (XARELTO) 20 MG TABS tablet Take 20 mg by mouth daily with supper.     budesonide (ENTOCORT EC) 3 MG 24 hr capsule  (Patient not taking: Reported on 09/27/2022)     furosemide  (LASIX) 40 MG tablet Take 1 tablet (40 mg total) by mouth daily. (Patient not taking: Reported on 09/27/2022) 90 tablet 3   hydrochlorothiazide (HYDRODIURIL) 25 MG tablet Take 1 tablet (25 mg total) by mouth daily. (Patient not taking: Reported on 09/27/2022) 90 tablet 3   No current facility-administered medications for this visit.    Past Medical History:  Diagnosis Date   AAA (abdominal aortic aneurysm) (HCC) 12/16   4cm infrarenal   Atrial fibrillation (HCC) 11/14   Collagen vascular disease (HCC)    Hypertension    Pancreatic insufficiency    Rectal cancer (HCC) 2000   At [facility] -stage 3. RT and chemo also   Rheumatoid arthritis(714.0)    Subclinical hypothyroidism 2014    Past Surgical History:  Procedure Laterality Date   CATARACT EXTRACTION W/PHACO Right 02/14/2016   Procedure: CATARACT EXTRACTION PHACO AND INTRAOCULAR LENS PLACEMENT (IOC);  Surgeon: Galen Manila, MD;  Location: [facility] ORS;  Service: Ophthalmology;  Laterality: Right;  [place] 01:02 AP% 18.7 CDE 11.59 Fluid pack lot # [id number]   CATARACT EXTRACTION W/PHACO Left 02/07/2021   Procedure: CATARACT EXTRACTION PHACO AND INTRAOCULAR LENS PLACEMENT (IOC) LEFT;  Surgeon: Galen Manila, MD;  Location: [facility];  Service: Ophthalmology;  Laterality: Left;  9.78 0:49.8   COLONOSCOPY     COLONOSCOPY WITH PROPOFOL N/A 01/27/2015   Procedure: COLONOSCOPY WITH PROPOFOL;  Surgeon: Elnita Maxwell, MD;  Location: [facility] END[facility];  Service: Endoscopy;  Laterality: N/A;   Right foot surgery Right 2/14   toes pinned   TRANSANAL RE[facility]  [place]   [facility]   VARICOSE VEIN SURGERY  1979     Social History   Tobacco Use   Smoking status: Former    Passive exposure: Past   Smokeless tobacco: Never  Substance Use Topics   Alcohol use: No   Drug use: No       Family History  Problem Relation Age of Onset   Liver cancer Mother    Breast cancer Mother    Heart disease Mother    Heart disease Brother         CABG and valve repair   Hypertension Brother    Colon cancer Neg Hx    Esophageal cancer Neg Hx    Rectal cancer Neg Hx    Stomach cancer Neg Hx    Diabetes Neg Hx      No Known Allergies   REVIEW OF SYSTEMS (Negative unless checked)  Constitutional: [] Weight loss  [] Fever  [] Chills Cardiac: [] Chest pain   [] Chest pressure   [x] Palpitations   [] Shortness of breath when laying flat   [] Shortness of breath at rest   [] Shortness of breath with exertion. Vascular:  [] Pain in legs with walking   [] Pain in legs at rest   [] Pain in legs when laying flat   [] Claudication   [] Pain in feet when walking  [] Pain in feet at rest  [] Pain in feet when laying flat   [] History of DVT   [] Phlebitis   [x] Swelling in legs   [] Varicose veins   [] Non-healing ulcers Pulmonary:   [] Uses home oxygen   [] Productive cough   [] Hemoptysis   [] Wheeze  [] COPD   [] Asthma Neurologic:  [] Dizziness  [] Blackouts   [] Seizures   [] History of stroke   [] History of TIA  [] Aphasia   [] Temporary blindness   [] Dysphagia   [] Weakness or numbness in arms   [] Weakness or numbness in legs Musculoskeletal:  [x] Arthritis   [] Joint swelling   [x] Joint pain   [] Low back pain Hematologic:  [] Easy bruising  [] Easy bleeding   [] Hypercoagulable state   [] Anemic   Gastrointestinal:  [] Blood in stool   [] Vomiting blood  [] Gastroesophageal reflux/heartburn   [] Abdominal pain Genitourinary:  [] Chronic kidney disease   [] Difficult urination  [] Frequent urination  [] Burning with urination   [] Hematuria Skin:  [] Rashes   [] Ulcers   [] Wounds Psychological:  [] History of anxiety   []  History of major depression.  Physical Examination  BP 139/73 (BP Location: Left Arm)   Pulse (!) 44   Resp 17   Ht 6\' 2"  (1.88 m)   Wt 171 lb (77.6 kg)   BMI 21.96 kg/m  Gen:  WD/WN, NAD.  Appears younger than stated age Head: [place]/AT, No temporalis wasting. Ear/Nose/Throat: Hearing grossly intact, nares w/o erythema or drainage Eyes: Conjunctiva clear.  Sclera non-icteric Neck: Supple.  Trachea midline Pulmonary:  Good air movement, no use of accessory muscles.  Cardiac: Bradycardic Vascular:  Vessel Right Left  Radial Palpable Palpable           Musculoskeletal: M/S 5/5 throughout.  No deformity or atrophy.  1+ bilateral lower extremity edema. Neurologic: Sensation grossly intact in extremities.  Symmetrical.  Speech is fluent.  Psychiatric: Judgment intact, Mood & affect appropriate for pt's clinical situation. Dermatologic: No rashes or ulcers noted.  No cellulitis or open wounds.  Labs Recent Results (from the past 2160 hour(s))  Renal function panel     Status: Abnormal   Collection Time: 09/10/22  8:24 AM  Result Value Ref Range   Sodium 138 135 - 145 mEq/L   Potassium 4.0 3.5 - 5.1 mEq/L   Chloride 101 96 - 112 mEq/L   CO2 29 19 - 32 mEq/L   Albumin 3.9 3.5 - 5.2 g/dL   BUN 54 (H) 6 - 23 mg/dL   Creatinine, Ser 1.61 (H) 0.40 - 1.50 mg/dL   Glucose, Bld 87 70 - 99 mg/dL   Phosphorus 3.2 2.3 - 4.6 mg/dL   GFR 09.60 (L) >45.40 mL/min    Comment: Calculated using the CKD-EPI Creatinine Equation (2021)   Calcium 9.5 8.4 - 10.5 mg/dL    Radiology CT Angio Abd/Pel w/ and/or w/o  Result Date: 09/27/2022 CLINICAL DATA:  Preop planning for abdominal aortic aneurysm EXAM: CTA ABDOMEN AND PELVIS WITHOUT AND WITH CONTRAST TECHNIQUE: Multidetector CT imaging of the abdomen and pelvis was performed using the standard protocol during bolus administration of intravenous contrast. Multiplanar reconstructed images and MIPs were obtained and reviewed to evaluate the vascular anatomy. RADIATION DOSE REDUCTION: This exam was performed according to the departmental dose-optimization program which includes automated exposure control, adjustment of the mA and/or kV according to patient size and/or use of iterative reconstruction technique. CONTRAST:  80 mL OMNIPAQUE IOHEXOL 350 MG/ML SOLN COMPARISON:  CT abdomen pelvis 02/09/2015  FINDINGS: VASCULAR Aorta: Calcified atheromatous plaque seen throughout the abdominal aorta. Aneurysmal dilatation of the infrarenal abdominal aorta measuring up to 5.3 x 5.1 cm. Celiac: Patent without evidence of aneurysm, dissection, vasculitis or significant stenosis. SMA: Patent without evidence of aneurysm, dissection, vasculitis or significant stenosis. Renals: Main and accessory right upper pole renal artery are patent. No significant stenosis of the left main and accessory upper pole renal arteries. IMA: Occluded at the origin. Opacification of distal branches consistent with retrograde collateral flow. Inflow: Evaluation of inflow vessels limited due to suboptimal opacification. There are diffusely calcified but appear patent without significant narrowing. Proximal Outflow: Evaluation is limited due to suboptimal opacification. Visualized outflow arteries are diffusely calcified but patent. Degree of stenosis difficult to quantify due to suboptimal opacification. Veins: No obvious venous abnormality within the limitations of this arterial phase study. Review of the MIP images confirms the above findings. 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 Additional nonspecific scattered patchy lung opacities may be due to atelectasis or pneumonitis. 7 mm nodule in the right middle lobe best seen on image 8 of series 6 is not significantly changed since 2016, consistent with benign etiology. It does not require dedicated imaging follow-up. Heart is mildly enlarged. Hepatobiliary: No focal liver abnormality is seen. No gallstones, gallbladder wall thickening, or biliary dilatation. Pancreas: Unremarkable. No pancreatic ductal dilatation or surrounding inflammatory changes. Spleen: Punctate splenic calcifications likely due to prior granulomatous inflammation. Spleen is normal in size. Adrenals/Urinary Tract: Adrenal glands are  unremarkable. Numerous simple cysts seen throughout both kidneys, largest located at the lower pole of the left kidney measuring 4.4 cm. These cysts do not require dedicated imaging follow-up. Stomach/Bowel: Stomach is within normal limits. Appendix is not definitively identified. No evidence of bowel wall thickening, distention, or inflammatory changes. Lymphatic: No enlarged abdominal or pelvic lymph nodes. Reproductive: Prostate is unremarkable. Other: No abdominal wall hernia or abnormality. No abdominopelvic ascites. Musculoskeletal: No acute or significant osseous findings. IMPRESSION: 1. Infrarenal  abdominal aortic aneurysm measuring up to 5.3 x 5.1 cm. Recommend follow-up every 6 months and vascular consultation. This recommendation follows ACR consensus guidelines: White Paper of the ACR Incidental Findings Committee II on Vascular Findings. J Am Coll Radiol 2013; 10:789-794. 2. No acute abnormality of the abdomen or pelvis. 3. 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 Aortic Atherosclerosis (ICD10-I70.0). Electronically Signed   By: Acquanetta Belling M.D.   On: 09/27/2022 14:36   VAS [place] AAA DUPLEX  Result Date: 09/19/2022 ABDOMINAL AORTA STUDY Patient Name:  Damon Torres  Date of Exam:   09/18/2022 Medical Rec #: [id number]        Accession #:    [id number] Date of Birth: 1941/05/08        Patient Gender: M Patient Age:   59 years Exam Location:  [facility] Procedure:      VAS [place] AAA DUPLEX Referring Phys: Sheppard Plumber --------------------------------------------------------------------------------  Indications: Follow up exam for known AAA.  Comparison Study: [id number] Performing Technologist: Salvadore Farber RVT  Examination Guidelines: A complete evaluation includes B-mode imaging, spectral Doppler, color Doppler, and power Doppler as needed of all accessible portions of each vessel. Bilateral testing is considered an integral part of a complete examination. Limited  examinations for reoccurring indications may be performed as noted.  Abdominal Aorta Findings: +-----------+-------+----------+----------+--------+--------+--------+ Location   AP (cm)Trans (cm)PSV (cm/s)WaveformThrombusComments +-----------+-------+----------+----------+--------+--------+--------+ Proximal   1.93   2.07      77                                 +-----------+-------+----------+----------+--------+--------+--------+ Mid        1.98   2.06      93                                 +-----------+-------+----------+----------+--------+--------+--------+ Distal     4.97   4.69      71                                 +-----------+-------+----------+----------+--------+--------+--------+ RT CIA Prox1.1    1.1       78                                 +-----------+-------+----------+----------+--------+--------+--------+ LT CIA Prox1.1    1.2       81                                 +-----------+-------+----------+----------+--------+--------+--------+  Summary: Abdominal Aorta: There is evidence of abnormal dilatation of the distal Abdominal aorta. The largest aortic measurement is 5.0 cm. Mild atherosclerosis throughout. The largest aortic diameter has increased compared to prior exam. Previous diameter measurement was 4.8 cm obtained on 03/2022.  *See table(s) above for measurements and observations.  Electronically signed by Festus Barren MD on 09/19/2022 at 8:24:54 AM.    Final     Assessment/Plan Hyperlipidemia, mixed lipid control important in reducing the progression of atherosclerotic disease. Continue statin therapy     Essential hypertension blood pressure control important in reducing the progression of atherosclerotic disease. On appropriate oral medications.   Peripheral vascular obstructive disease (HCC) ABIs were normal with  good digit pressures and waveforms.  No role for intervention.  Can be checked every few years.  AAA (abdominal aortic  aneurysm) (HCC) Since his last visit, he has undergone a CT angiogram which I have independently reviewed.  This demonstrates an approximately 5.3 cm infrarenal abdominal aortic aneurysm with mild ectasia of the iliac arteries.  He has a fairly long infrarenal neck with multiple renal arteries bilaterally.  No evidence of extravasation or dissection.  Recommend:  The aneurysm is > 5 cm and therefore should undergo repair. Patient is status post CT scan of the abdominal aorta. The patient is a candidate for endovascular repair.   The patient will require cardiac clearance prior to stent graft placement.   The patient will continue antiplatelet therapy as prescribed (since the patient is undergoing endovascular repair as opposed to open repair) as well as aggressive management of hyperlipidemia. Exercise is again strongly encouraged.   The patient is reminded that lifetime routine surveillance is a necessity with an endograft.   The risks and benefits of AAA repair are reviewed with the patient.  All questions are answered.  Alternative therapies are also discussed.  The patient agrees to proceed with endovascular aneurysm repair.  Patient will follow-up with me in the office after the surgery.    Festus Barren, MD  10/02/2022 9:18 AM    This note was created with Dragon medical transcription system.  Any errors from dictation are purely unintentional

## 2022-10-05 ENCOUNTER — Other Ambulatory Visit: Payer: Self-pay | Admitting: Internal Medicine

## 2022-10-05 DIAGNOSIS — I872 Venous insufficiency (chronic) (peripheral): Secondary | ICD-10-CM

## 2022-10-05 DIAGNOSIS — I4811 Longstanding persistent atrial fibrillation: Secondary | ICD-10-CM

## 2022-10-05 DIAGNOSIS — I83002 Varicose veins of unspecified lower extremity with ulcer of calf: Secondary | ICD-10-CM

## 2022-10-05 DIAGNOSIS — N1832 Chronic kidney disease, stage 3b: Secondary | ICD-10-CM

## 2022-10-12 ENCOUNTER — Ambulatory Visit: Payer: Federal, State, Local not specified - PPO | Attending: Internal Medicine

## 2022-10-12 DIAGNOSIS — I4811 Longstanding persistent atrial fibrillation: Secondary | ICD-10-CM

## 2022-10-12 DIAGNOSIS — I272 Pulmonary hypertension, unspecified: Secondary | ICD-10-CM

## 2022-10-12 DIAGNOSIS — I5020 Unspecified systolic (congestive) heart failure: Secondary | ICD-10-CM

## 2022-10-12 HISTORY — DX: Pulmonary hypertension, unspecified: I27.20

## 2022-10-12 HISTORY — PX: DOPPLER ECHOCARDIOGRAPHY: SHX263

## 2022-10-12 HISTORY — DX: Unspecified systolic (congestive) heart failure: I50.20

## 2022-10-12 LAB — ECHOCARDIOGRAM COMPLETE
Calc EF: 43.5 %
P 1/2 time: 941 msec
S' Lateral: 4.5 cm
Single Plane A2C EF: 45.9 %
Single Plane A4C EF: 41.7 %

## 2022-10-15 NOTE — Progress Notes (Signed)
Apparently the leg is not new--but got red yesterday I will check it tomorrow since appt made (he didn't want ER)

## 2022-10-16 ENCOUNTER — Encounter: Payer: Self-pay | Admitting: Internal Medicine

## 2022-10-16 ENCOUNTER — Ambulatory Visit: Payer: Federal, State, Local not specified - PPO | Admitting: Internal Medicine

## 2022-10-16 VITALS — BP 120/78 | HR 57 | Temp 97.2°F | Ht 73.5 in | Wt 170.0 lb

## 2022-10-16 DIAGNOSIS — I5022 Chronic systolic (congestive) heart failure: Secondary | ICD-10-CM | POA: Diagnosis not present

## 2022-10-16 DIAGNOSIS — I872 Venous insufficiency (chronic) (peripheral): Secondary | ICD-10-CM

## 2022-10-16 HISTORY — DX: Chronic systolic (congestive) heart failure: I50.22

## 2022-10-16 NOTE — Progress Notes (Signed)
Subjective:    Patient ID: Damon Torres, male    DOB: Jul 16, 1940, 82 y.o.   MRN: [id number]  HPI Here due to worsening swelling and redness in left leg  Left leg worsened --more swollen and red 2 days ago Chronic swelling but then looked worse Now better No fever  Right leg is swollen also  Does take the furosemide daily now Edema had gone away--so he stopped it (so restarted when it came back)  Current Outpatient Medications on File Prior to Visit  Medication Sig Dispense Refill   benazepril (LOTENSIN) 40 MG tablet Take 40 mg by mouth daily.     calcium-vitamin D (OSCAL WITH D) 500-200 MG-UNIT per tablet Take 1 tablet by mouth daily with breakfast.     furosemide (LASIX) 40 MG tablet Take 1 tablet (40 mg total) by mouth daily. 90 tablet 3   HUMIRA PEN 40 MG/0.4ML PNKT Inject 40 mg into the skin every 14 (fourteen) days.     hydrochlorothiazide (HYDRODIURIL) 25 MG tablet Take 1 tablet (25 mg total) by mouth daily. 90 tablet 3   ketoconazole (NIZORAL) 2 % shampoo Apply topically 2 (two) times a week. 120 mL 3   levothyroxine (SYNTHROID) 75 MCG tablet TAKE 1 TABLET DAILY BEFORE BREAKFAST **MFR MYLAN** 90 tablet 3   lipase/protease/amylase (CREON) 36000 UNITS CPEP capsule TAKE 4 CAPSULES 3 TIMES A  DAY BEFORE MEALS. TAKE UP  TO 2 CAPSULES BEFORE ALL  SNACKS 1620 capsule 3   Omega-3 Fatty Acids (FISH OIL) 1000 MG CAPS Take 1 capsule by mouth daily.     rivaroxaban (XARELTO) 20 MG TABS tablet Take 20 mg by mouth daily with supper.     budesonide (ENTOCORT EC) 3 MG 24 hr capsule  (Patient not taking: Reported on 09/27/2022)     doxycycline (VIBRA-TABS) 100 MG tablet Take 1 tablet (100 mg total) by mouth 2 (two) times daily. (Patient not taking: Reported on 10/16/2022) 14 tablet 0   No current facility-administered medications on file prior to visit.    No Known Allergies  Past Medical History:  Diagnosis Date   AAA (abdominal aortic aneurysm) (HCC) 12/16   4cm infrarenal    Atrial fibrillation (HCC) 11/14   Collagen vascular disease (HCC)    Hypertension    Pancreatic insufficiency    Rectal cancer (HCC) 2000   At [facility] -stage 3. RT and chemo also   Rheumatoid arthritis(714.0)    Subclinical hypothyroidism 2014    Past Surgical History:  Procedure Laterality Date   CATARACT EXTRACTION W/PHACO Right 02/14/2016   Procedure: CATARACT EXTRACTION PHACO AND INTRAOCULAR LENS PLACEMENT (IOC);  Surgeon: Galen Manila, MD;  Location: [facility] ORS;  Service: Ophthalmology;  Laterality: Right;  [place] 01:02 AP% 18.7 CDE 11.59 Fluid pack lot # [id number]   CATARACT EXTRACTION W/PHACO Left 02/07/2021   Procedure: CATARACT EXTRACTION PHACO AND INTRAOCULAR LENS PLACEMENT (IOC) LEFT;  Surgeon: Galen Manila, MD;  Location: [facility];  Service: Ophthalmology;  Laterality: Left;  9.78 0:49.8   COLONOSCOPY     COLONOSCOPY WITH PROPOFOL N/A 01/27/2015   Procedure: COLONOSCOPY WITH PROPOFOL;  Surgeon: Elnita Maxwell, MD;  Location: [facility] ENDOSCOPY;  Service: Endoscopy;  Laterality: N/A;   Right foot surgery Right 2/14   toes pinned   TRANSANAL RECTAL RESECTION  2000   [facility]   VARICOSE VEIN SURGERY  1979    Family History  Problem Relation Age of Onset   Liver cancer Mother    Breast cancer Mother  Heart disease Mother    Heart disease Brother        CABG and valve repair   Hypertension Brother    Colon cancer Neg Hx    Esophageal cancer Neg Hx    Rectal cancer Neg Hx    Stomach cancer Neg Hx    Diabetes Neg Hx     Social History   Socioeconomic History   Marital status: Widowed    Spouse name: Not on file   Number of children: 2   Years of education: Not on file   Highest education level: Not on file  Occupational History   Occupation: Retired Event organiser for [employer]    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  Tobacco Use   Smoking status: Former    Passive exposure: Past   Smokeless tobacco: Never  Substance and Sexual  Activity   Alcohol use: No   Drug use: No   Sexual activity: Not on file  Other Topics Concern   Not on file  Social History Narrative   Widowed 5/20   Now has living will   Daughter is health care POA   Would accept resuscitation attempts   Would not want tube feeds if cognitively unaware   Social Determinants of Health   Financial Resource Strain: Not on file  Food Insecurity: Not on file  Transportation Needs: Not on file  Physical Activity: Not on file  Stress: Not on file  Social Connections: Not on file  Intimate Partner Violence: Not on file   Review of Systems Doesn't add salt Appetite is okay     Objective:   Physical Exam Cardiovascular:     Comments: Faint pedal pulses Skin:    Comments: 2+ pitting edema left foot to lower calf 1+ pitting edema on right  No redness or tenderness either side            Assessment & Plan:

## 2022-10-16 NOTE — Assessment & Plan Note (Signed)
Recent echo shows mildly reduced EF Is getting a stress test later this month by his cardiologist---asked him to review the echo with him Asked him to take the furosemide 40mg  daily and continue the benazepril

## 2022-10-16 NOTE — Assessment & Plan Note (Signed)
Nothing to suggest infection or DVT Is on xarelto anyway for atrial fib

## 2022-10-19 HISTORY — PX: CARDIOVASCULAR STRESS TEST: SHX262

## 2022-11-02 DIAGNOSIS — I7143 Infrarenal abdominal aortic aneurysm, without rupture: Secondary | ICD-10-CM

## 2022-11-02 DIAGNOSIS — I5020 Unspecified systolic (congestive) heart failure: Secondary | ICD-10-CM

## 2022-11-02 HISTORY — DX: Unspecified systolic (congestive) heart failure: I50.20

## 2022-11-02 HISTORY — DX: Infrarenal abdominal aortic aneurysm, without rupture: I71.43

## 2022-11-09 ENCOUNTER — Telehealth (INDEPENDENT_AMBULATORY_CARE_PROVIDER_SITE_OTHER): Payer: Self-pay

## 2022-11-09 NOTE — Telephone Encounter (Signed)
Patient called wanting to know about being scheduled for his AAA surgery. After looking into the system he was to be scheduled for a stress test and then they would determine if he was cleared. I have not received any information regarding him being cleared. I contacted the patient and asked him to contact his cardiologist to find out if he needed to do another test. The patient stated he had another test but wasn't sure if it was for his clearance. Patient stated he would call them on Monday and ask.

## 2022-11-20 DIAGNOSIS — R911 Solitary pulmonary nodule: Secondary | ICD-10-CM

## 2022-11-20 HISTORY — DX: Solitary pulmonary nodule: R91.1

## 2022-12-11 ENCOUNTER — Telehealth (INDEPENDENT_AMBULATORY_CARE_PROVIDER_SITE_OTHER): Payer: Self-pay

## 2022-12-11 NOTE — Telephone Encounter (Signed)
Spoke with the patient and he is scheduled with Dr. Wyn Quaker on 01/02/23 for a [facility] repair at [facility]. Pre-op is scheduled on 12/24/22 at 9:00 am at the [facility]. Pre-surgical instructions were discussed and will be sent to Mychart and mailed.

## 2022-12-15 ENCOUNTER — Encounter: Payer: Self-pay | Admitting: Emergency Medicine

## 2022-12-15 ENCOUNTER — Ambulatory Visit
Admission: EM | Admit: 2022-12-15 | Discharge: 2022-12-15 | Disposition: A | Discharge status: Home / Self Care | Payer: Federal, State, Local not specified - PPO | Attending: Physician Assistant | Admitting: Physician Assistant

## 2022-12-15 DIAGNOSIS — R21 Rash and other nonspecific skin eruption: Secondary | ICD-10-CM

## 2022-12-15 MED ORDER — PREDNISONE 20 MG PO TABS: 20.0000 mg | ORAL_TABLET | Freq: Every day | ORAL | 0 refills | Status: AC

## 2022-12-15 NOTE — Discharge Instructions (Signed)
Take prednisone as prescribed Can continue with topical cortisone cream Follow up with PCP or here if no improvement

## 2022-12-15 NOTE — ED Triage Notes (Signed)
Possible insect bites over body. Per patient doesn't remember being bitten. Questioned if it may be flea bites  and itch x3d.  OTC: cortisone cream  Denies: N/V fever

## 2022-12-15 NOTE — ED Provider Notes (Signed)
Damon Torres    CSN: [id number] Arrival date & time: 12/15/22  0840      History   Chief Complaint Chief Complaint  Patient presents with   Insect Bite    HPI Damon Torres is a 82 y.o. male.   Patient presents with possible insect bites all over his body.  He reports noticing them several days ago.  He reports they are very itchy.  He has tried cortisone cream with minimal improvement.  No other symptoms.    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   Advance directive discussed with patient 02/12/2019   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   Ecchymosis 01/22/2019   Hyperlipidemia, mixed 01/06/2019   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   Gastric nodule 03/28/2016   Pancreatic insufficiency 09/29/2015   Macrocytosis 09/29/2015   Pelvic floor dysfunction 09/29/2015   AAA (abdominal aortic aneurysm) (HCC)    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   Hypothyroid 06/08/2013   Chronic anticoagulation 06/08/2013   Longstanding persistent atrial fibrillation (HCC)  04/12/2013   Routine general medical examination at a health care facility 09/26/2012   Hallux valgus, left 02/26/2012   History of rectal cancer 08/29/2011   Rheumatoid arthritis involving multiple joints (HCC) 08/29/2011   Essential hypertension 08/29/2011   History of varicose veins 08/29/2011    Past Surgical History:  Procedure Laterality Date   CATARACT EXTRACTION W/PHACO Right 02/14/2016   Procedure: CATARACT EXTRACTION PHACO AND INTRAOCULAR LENS PLACEMENT (IOC);  Surgeon: Galen Manila, MD;  Location: [facility] ORS;  Service: Ophthalmology;  Laterality: Right;  [place] 01:02 AP% 18.7 CDE 11.59 Fluid pack lot # [id number]   CATARACT EXTRACTION W/PHACO Left 02/07/2021   Procedure: CATARACT EXTRACTION PHACO AND INTRAOCULAR LENS PLACEMENT (IOC) LEFT;  Surgeon: Galen Manila, MD;  Location: [facility];  Service: Ophthalmology;  Laterality: Left;  9.78 0:49.8   COLONOSCOPY     COLONOSCOPY WITH PROPOFOL N/A 01/27/2015   Procedure: COLONOSCOPY WITH PROPOFOL;  Surgeon: Elnita Maxwell, MD;  Location: [facility];  Service: Endoscopy;  Laterality: N/A;   Right foot surgery Right 2/14   toes pinned   TRANSANAL RECTAL RESECTION  [place]   [facility]   VARICOSE VEIN SURGERY  1979       Home Medications    Prior to Admission medications   Medication Sig Start Date End Date Taking? Authorizing Provider  predniSONE (DELTASONE) 20 MG tablet Take 1 tablet (20 mg total) by mouth daily for 5 days. 12/15/22 12/20/22 Yes Ward, Tylene Fantasia, PA-C  benazepril (LOTENSIN) 40 MG tablet Take 40 mg by mouth daily.    [provider]  calcium-vitamin D (OSCAL WITH D) 500-200 MG-UNIT per tablet Take 1 tablet by mouth daily with breakfast.    [provider]  furosemide (LASIX) 40 MG tablet Take 1 tablet (40 mg total) by mouth daily. 08/27/22   Karie Schwalbe, MD  HUMIRA PEN 40 MG/0.4ML PNKT Inject 40 mg into the skin every 14 (fourteen) days. 06/28/20   [provider]   hydrochlorothiazide (HYDRODIURIL) 25 MG tablet Take 1 tablet (25 mg total) by mouth daily. 06/12/22   Karie Schwalbe, MD  ketoconazole (NIZORAL) 2 % shampoo Apply topically 2 (two) times a week. 05/25/21   Karie Schwalbe, MD  levothyroxine (SYNTHROID) 75 MCG tablet TAKE 1 TABLET DAILY BEFORE BREAKFAST **MFR MYLAN** 11/28/21   Tillman Abide I, MD  lipase/protease/amylase (CREON) 36000 UNITS CPEP capsule TAKE 4 CAPSULES 3 TIMES A  DAY BEFORE MEALS. TAKE UP  TO 2 CAPSULES BEFORE ALL  SNACKS 07/30/22   Karie Schwalbe, MD  Omega-3 Fatty Acids (FISH OIL) 1000 MG CAPS Take 1 capsule by mouth daily.    [provider]  rivaroxaban (XARELTO) 20 MG TABS tablet Take 20 mg by mouth daily with supper.    [provider]    Family History Family History  Problem Relation Age of Onset   Liver cancer Mother    Breast cancer Mother    Heart disease Mother    Heart disease Brother        CABG and valve repair   Hypertension Brother    Colon cancer Neg Hx    Esophageal cancer Neg Hx    Rectal cancer Neg Hx    Stomach cancer Neg Hx    Diabetes Neg Hx     Social History Social History   Tobacco Use   Smoking status: Former    Passive exposure: Past   Smokeless tobacco: Never  Substance Use Topics   Alcohol use: No   Drug use: No     Allergies   Patient has no known allergies.   Review of Systems Review of Systems  Constitutional:  Negative for chills and fever.  HENT:  Negative for ear pain and sore throat.   Eyes:  Negative for pain and visual disturbance.  Respiratory:  Negative for cough and shortness of breath.   Cardiovascular:  Negative for chest pain and palpitations.  Gastrointestinal:  Negative for abdominal pain and vomiting.  Genitourinary:  Negative for dysuria and hematuria.  Musculoskeletal:  Negative for arthralgias and back pain.  Skin:  Positive for rash. Negative for color change.  Neurological:  Negative for seizures and syncope.  All  other systems reviewed and are negative.    Physical Exam Triage Vital Signs ED Triage Vitals  Encounter Vitals Group     BP 12/15/22 0856 (!) 145/74     Systolic BP Percentile --      Diastolic BP Percentile --      Pulse Rate 12/15/22 0856 63     Resp 12/15/22 0856 18     Temp 12/15/22 0856 98.1 F (36.7 C)     Temp src --      SpO2 12/15/22 0856 98 %     Weight 12/15/22 0853 174 lb (78.9 kg)     Height 12/15/22 0853 6\' 2"  (1.88 m)     Head Circumference --      Peak Flow --      Pain Score --      Pain Loc --  Pain Education --      Exclude from Growth Chart --    No data found.  Updated Vital Signs BP (!) 145/74 (BP Location: Left Arm)   Pulse 63   Temp 98.1 F (36.7 C)   Resp 18   Ht 6\' 2"  (1.88 m)   Wt 174 lb (78.9 kg)   SpO2 98%   BMI 22.34 kg/m   Visual Acuity Right Eye Distance:   Left Eye Distance:   Bilateral Distance:    Right Eye Near:   Left Eye Near:    Bilateral Near:     Physical Exam Vitals and nursing note reviewed.  Constitutional:      General: He is not in acute distress.    Appearance: He is well-developed.  HENT:     Head: Normocephalic and atraumatic.  Eyes:     Conjunctiva/sclera: Conjunctivae normal.  Cardiovascular:     Rate and Rhythm: Normal rate and regular rhythm.     Heart sounds: No murmur heard. Pulmonary:     Effort: Pulmonary effort is normal. No respiratory distress.     Breath sounds: Normal breath sounds.  Abdominal:     Palpations: Abdomen is soft.     Tenderness: There is no abdominal tenderness.  Musculoskeletal:        General: No swelling.     Cervical back: Neck supple.  Skin:    General: Skin is warm and dry.     Capillary Refill: Capillary refill takes less than 2 seconds.     Comments: Small red raised bumps all over his abdomen, trunk, upper arms.  Neurological:     Mental Status: He is alert.  Psychiatric:        Mood and Affect: Mood normal.      UC Treatments / Results   Labs (all labs ordered are listed, but only abnormal results are displayed) Labs Reviewed - No data to display  EKG   Radiology No results found.  Procedures Procedures (including critical care time)  Medications Ordered in UC Medications - No data to display  Initial Impression / Assessment and Plan / UC Course  I have reviewed the triage vital signs and the nursing notes.  Pertinent labs & imaging results that were available during my care of the patient were reviewed by me and considered in my medical decision making (see chart for details).     Rash, possible insect bites.  Will give short course of prednisone.  Discussed possible fleas and bedbugs at home.  Rash is not consistent with scabies.  Return precautions discussed. Final Clinical Impressions(s) / UC Diagnoses   Final diagnoses:  Rash and nonspecific skin eruption     Discharge Instructions      Take prednisone as prescribed Can continue with topical cortisone cream Follow up with PCP or here if no improvement    ED Prescriptions     Medication Sig Dispense Auth. Provider   predniSONE (DELTASONE) 20 MG tablet Take 1 tablet (20 mg total) by mouth daily for 5 days. 5 tablet Ward, Tylene Fantasia, PA-C      PDMP not reviewed this encounter.   Ward, Tylene Fantasia, PA-C 12/15/22 1408

## 2022-12-18 ENCOUNTER — Ambulatory Visit: Payer: Federal, State, Local not specified - PPO

## 2022-12-21 ENCOUNTER — Other Ambulatory Visit (INDEPENDENT_AMBULATORY_CARE_PROVIDER_SITE_OTHER): Payer: Self-pay | Admitting: Nurse Practitioner

## 2022-12-21 DIAGNOSIS — I7143 Infrarenal abdominal aortic aneurysm, without rupture: Secondary | ICD-10-CM

## 2022-12-24 ENCOUNTER — Encounter
Admission: RE | Admit: 2022-12-24 | Discharge: 2022-12-24 | Disposition: A | Discharge status: Home / Self Care | Payer: Federal, State, Local not specified - PPO | Source: Ambulatory Visit | Attending: Vascular Surgery | Admitting: Vascular Surgery

## 2022-12-24 ENCOUNTER — Other Ambulatory Visit: Payer: Self-pay

## 2022-12-24 VITALS — Ht 74.0 in | Wt 174.0 lb

## 2022-12-24 DIAGNOSIS — R7303 Prediabetes: Secondary | ICD-10-CM

## 2022-12-24 DIAGNOSIS — I5022 Chronic systolic (congestive) heart failure: Secondary | ICD-10-CM | POA: Diagnosis not present

## 2022-12-24 DIAGNOSIS — Z01818 Encounter for other preprocedural examination: Secondary | ICD-10-CM

## 2022-12-24 DIAGNOSIS — Z01810 Encounter for preprocedural cardiovascular examination: Secondary | ICD-10-CM | POA: Diagnosis not present

## 2022-12-24 DIAGNOSIS — J988 Other specified respiratory disorders: Secondary | ICD-10-CM

## 2022-12-24 DIAGNOSIS — I7143 Infrarenal abdominal aortic aneurysm, without rupture: Secondary | ICD-10-CM

## 2022-12-24 DIAGNOSIS — I10 Essential (primary) hypertension: Secondary | ICD-10-CM

## 2022-12-24 DIAGNOSIS — E782 Mixed hyperlipidemia: Secondary | ICD-10-CM

## 2022-12-24 LAB — CBC WITH DIFFERENTIAL/PLATELET
Abs Immature Granulocytes: 0.08 10*3/uL — ABNORMAL HIGH (ref 0.00–0.07)
Basophils Absolute: 0.1 10*3/uL (ref 0.0–0.1)
Basophils Relative: 1 %
Eosinophils Absolute: 0.2 10*3/uL (ref 0.0–0.5)
Eosinophils Relative: 2 %
HCT: 34 % — ABNORMAL LOW (ref 39.0–52.0)
Hemoglobin: 11.7 g/dL — ABNORMAL LOW (ref 13.0–17.0)
Immature Granulocytes: 1 %
Lymphocytes Relative: 34 %
Lymphs Abs: 2.8 10*3/uL (ref 0.7–4.0)
MCH: 35.5 pg — ABNORMAL HIGH (ref 26.0–34.0)
MCHC: 34.4 g/dL (ref 30.0–36.0)
MCV: 103 fL — ABNORMAL HIGH (ref 80.0–100.0)
Monocytes Absolute: 1.1 10*3/uL — ABNORMAL HIGH (ref 0.1–1.0)
Monocytes Relative: 14 %
Neutro Abs: 4 10*3/uL (ref 1.7–7.7)
Neutrophils Relative %: 48 %
Platelets: 152 10*3/uL (ref 150–400)
RBC: 3.3 MIL/uL — ABNORMAL LOW (ref 4.22–5.81)
RDW: 14.1 % (ref 11.5–15.5)
WBC: 8.2 10*3/uL (ref 4.0–10.5)
nRBC: 0 % (ref 0.0–0.2)

## 2022-12-24 LAB — TYPE AND SCREEN
ABO/RH(D): O NEG
Antibody Screen: NEGATIVE

## 2022-12-24 LAB — BASIC METABOLIC PANEL
Anion gap: 6 (ref 5–15)
BUN: 46 mg/dL — ABNORMAL HIGH (ref 8–23)
CO2: 25 mmol/L (ref 22–32)
Calcium: 8.6 mg/dL — ABNORMAL LOW (ref 8.9–10.3)
Chloride: 104 mmol/L (ref 98–111)
Creatinine, Ser: 1.65 mg/dL — ABNORMAL HIGH (ref 0.61–1.24)
GFR, Estimated: 41 mL/min — ABNORMAL LOW (ref >60)
Glucose, Bld: 129 mg/dL — ABNORMAL HIGH (ref 70–99)
Potassium: 5.1 mmol/L (ref 3.5–5.1)
Sodium: 135 mmol/L (ref 135–145)

## 2022-12-24 LAB — SURGICAL PCR SCREEN
MRSA, PCR: NEGATIVE
Staphylococcus aureus: NEGATIVE

## 2022-12-24 NOTE — Patient Instructions (Addendum)
Your procedure is scheduled on: Wednseday, July 31 Report to the Registration Desk on the 1st floor of the [facility] To find out your arrival time, please call [phone] between 1PM - 3PM on: Tuesday, July 30  If your arrival time is 6:00 am, do not arrive before that time as the [facility] entrance doors do not open until 6:00 am.  REMEMBER: Instructions that are not followed completely may result in serious medical risk, up to and including death; or upon the discretion of your surgeon and anesthesiologist your surgery may need to be rescheduled.  Do not eat food after midnight the night before surgery.  No gum chewing or hard candies.   One week prior to surgery: Stop Anti-inflammatories (NSAIDS) such as Advil, Aleve, Ibuprofen, Motrin, Naproxen, Naprosyn and Aspirin based products such as Excedrin, Goody's Powder, BC Powder.  Stop ANY OVER THE COUNTER supplements until after surgery.   You may however, continue to take Tylenol if needed for pain up until the day of surgery.  Continue taking all prescribed medications with the exception of the following: furosemide (LASIX)  do not take the day of surgery, last dose Tuesday July 30  spironolactone (ALDACTONE)  do not take the day of surgery, last dose Tuesday 30 dapagliflozin propanediol (FARXIGA) hold 3 days before surgery, last dose Saturday July 27  sacubitril-valsartan [facility]) do not take the day of surgery last dose  Tuesday July 30    Follow recommendations from Cardiologist or PCP regarding stopping blood thinners. rivaroxaban (XARELTO) do not take three days before surgery, Last dose Saturday July 27   TAKE ONLY THESE MEDICATIONS THE MORNING OF SURGERY WITH A SIP OF WATER:  levothyroxine (SYNTHROID)    No Alcohol for 24 hours before or after surgery.  No Smoking including e-cigarettes for 24 hours before surgery.  No chewable tobacco products for at least 6 hours before surgery.  No nicotine patches on  the day of surgery.  Do not use any "recreational" drugs for at least a week (preferably 2 weeks) before your surgery.  Please be advised that the combination of cocaine and anesthesia may have negative outcomes, up to and including death. If you test positive for cocaine, your surgery will be cancelled.  On the morning of surgery brush your teeth with toothpaste and water, you may rinse your mouth with mouthwash if you wish. Do not swallow any toothpaste or mouthwash.  Use CHG Soap as directed on instruction sheet.  Do not wear jewelry, make-up, hairpins, clips or nail polish.  Do not wear lotions, powders, or perfumes.   Do not shave body hair from the neck down 48 hours before surgery.  Contact lenses, hearing aids and dentures may not be worn into surgery.  Do not bring valuables to the hospital. [facility] is not responsible for any missing/lost belongings or valuables.   Notify your doctor if there is any change in your medical condition (cold, fever, infection).  Wear comfortable clothing (specific to your surgery type) to the hospital.  After surgery, you can help prevent lung complications by doing breathing exercises.  Take deep breaths and cough every 1-2 hours.   If you are being admitted to the hospital overnight, leave your suitcase in the car. After surgery it may be brought to your room.  In case of increased patient census, it may be necessary for you, the patient, to continue your postoperative care in the Same Day Surgery department. If you are taking public transportation, you  will need to have a responsible individual with you.  Please call the Pre-admissions Testing Dept. at [phone] if you have any questions about these instructions.  Surgery Visitation Policy:  Patients having surgery or a procedure may have two visitors.  Children under the age of 67 must have an adult with them who is not the patient.  Inpatient Visitation:    Visiting hours  are 7 a.m. to 8 p.m. Up to four visitors are allowed at one time in a patient room. The visitors may rotate out with other people during the day.  One visitor age 52 or older may stay with the patient overnight and must be in the room by 8 p.m.     Preparing for Surgery with CHLORHEXIDINE GLUCONATE (CHG) Soap  Chlorhexidine Gluconate (CHG) Soap  o An antiseptic cleaner that kills germs and bonds with the skin to continue killing germs even after washing  o Used for showering the night before surgery and morning of surgery  Before surgery, you can play an important role by reducing the number of germs on your skin.  CHG (Chlorhexidine gluconate) soap is an antiseptic cleanser which kills germs and bonds with the skin to continue killing germs even after washing.  Please do not use if you have an allergy to CHG or antibacterial soaps. If your skin becomes reddened/irritated stop using the CHG.  1. Shower the NIGHT BEFORE SURGERY and the MORNING OF SURGERY with CHG soap.  2. If you choose to wash your hair, wash your hair first as usual with your normal shampoo.  3. After shampooing, rinse your hair and body thoroughly to remove the shampoo.  4. Use CHG as you would any other liquid soap. You can apply CHG directly to the skin and wash gently with a scrungie or a clean washcloth.  5. Apply the CHG soap to your body only from the neck down. Do not use on open wounds or open sores. Avoid contact with your eyes, ears, mouth, and genitals (private parts). Wash face and genitals (private parts) with your normal soap.  6. Wash thoroughly, paying special attention to the area where your surgery will be performed.  7. Thoroughly rinse your body with warm water.  8. Do not shower/wash with your normal soap after using and rinsing off the CHG soap.  9. Pat yourself dry with a clean towel.  10. Wear clean pajamas to bed the night before surgery.  12. Place clean sheets on your bed the night  of your first shower and do not sleep with pets.  13. Shower again with the CHG soap on the day of surgery prior to arriving at the hospital.  14. Do not apply any deodorants/lotions/powders.  15. Please wear clean clothes to the hospital.

## 2022-12-26 ENCOUNTER — Encounter: Payer: Self-pay | Admitting: Urgent Care

## 2022-12-26 ENCOUNTER — Ambulatory Visit: Payer: Federal, State, Local not specified - PPO | Admitting: Dermatology

## 2022-12-27 NOTE — Progress Notes (Signed)
Perioperative / Anesthesia Services  Pre-Admission Testing Clinical Review / Preoperative Anesthesia Consult  Date: 12/31/22  Patient Demographics:  Name: Damon Torres DOB:   Nov 09, 1940 MRN:   [id number]  Planned Surgical Procedure(s):    Case: [id number] Date/Time: 01/02/23 0800   Procedure: ENDOVASCULAR REPAIR/STENT GRAFT   Anesthesia type: General   Diagnosis: Abdominal aortic aneurysm (AAA) without rupture, unspecified part (HCC) [I71.40]   Pre-op diagnosis:      AAA   ANESTHESIA   GORE   AAA repair     Dr Wyn Quaker w  [facility] to assist   Location: [facility]   Providers: Annice Needy, MD     NOTE: Available PAT nursing documentation and vital signs have been reviewed. Clinical nursing staff has updated patient's PMH/PSHx, current medication list, and drug allergies/intolerances to ensure comprehensive history available to assist in medical decision making as it pertains to the aforementioned surgical procedure and anticipated anesthetic course. Extensive review of available clinical information personally performed. [place] PMH and PSHx updated with any diagnoses/procedures that  may have been inadvertently omitted during his intake with the pre-admission testing department's nursing staff.  Clinical Discussion:  Damon Torres is a 82 y.o. male who is submitted for pre-surgical anesthesia review and clearance prior to him undergoing the above procedure. Patient is a Former Games developer. 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  Patient is followed by cardiology Azucena Kuba, MD). He was last seen in the cardiology clinic on 11/02/2022; notes reviewed. At the time of  his clinic visit, patient doing well overall from a cardiovascular perspective. Patient denied any chest pain, shortness of breath, PND, orthopnea, palpitations, significant peripheral edema, weakness, fatigue, vertiginous symptoms, or presyncope/syncope. Patient with a past medical history significant for cardiovascular diagnoses. Documented physical exam was grossly benign, providing no evidence of acute exacerbation and/or decompensation of the patient's known cardiovascular conditions.  Most recent TTE was performed on 10/12/2022 revealing a mildly reduced left ventricular systolic function with an EF of 40-45%.  There was mild global hypokinesis.  Left ventricular cavity size mildly to moderately dilated.  Right ventricular size and function normal.  PASP moderately elevated at 52.9 mmHg consistent with pulmonary hypertension.  There was moderate left atrial and severe right atrial enlargement.  Moderate mitral and tricuspid, in addition to mild to moderate aortic valve regurgitation noted.  Aortic valve sclerosis/calcification present.  All transvalvular gradients were noted be normal providing no evidence suggestive of valvular stenosis.    Cardiac CTA was performed on 11/20/2022 revealing a coronary calcium score of 784.  This placed patient in the 59th percentile for age/sex, race matched control.  CT FFR analysis was not performed during the study.  Study suggestive of an approximate 30% stenosis in the distal RCA and <25% stenosis and the LAD and proximal LCx.  Patient with aneurysmal dilatation of the infrarenal abdominal aorta stents at least 07/2014, at which time CT imaging measured the defect at 3.8 cm.  Aneurysmal defect has been monitored serially since the time of diagnosis.  Most recent CTA of the abdomen pelvis was performed on 09/26/2022 with interval increase in size to 5.3 x 5.1 cm.  Patient with an atrial fibrillation diagnosis; CHA2DS2-VASc Score = 5 (age x 2, CHF, HTN, vascular  disease history).cardiac rate and rhythm currently intrinsically maintained without  the use of pharmacological intervention.  Patient is chronically anticoagulated using rivaroxaban.  He is reportedly compliant with therapy with no evidence or reports of GI bleeding.  Blood pressure uncontrolled at 152/74 mmHg on currently prescribed diuretic ACEi (benazepril) and diuretic (HCTZ + furosemide) therapies.  Patient is on rosuvastatin for his HLD diagnosis and further ASCVD prevention.  He is not diabetic. Patient does not have an OSAH diagnosis.  Patient making an effort to maintain an active lifestyle.  He goes to the [facility] for exercise classes, where he uses weights and bands, 5 days a week.  Patient is able to complete all of his ADLs/IADLs without cardiovascular limitation.  Per the [facility], patient is able to achieve at least 4 METS of physical activity without experiencing any significant degree of angina/anginal equivalent symptoms.  Due to his elevated blood pressure in the setting of known HFrEF, the decision was made to stop patient's benazepril and HCTZ. Patient was started on ARB/ARNI Sherryll Burger), SGLT2i (dapagliflozin), and K+ sparing diuretic (spirolactone).  Patient was to continue his daily furosemide dose to help with his manage his HFrEF and associated peripheral edema.  No other changes were made to his medication regimen.  Patient follow-up with outpatient cardiology in 3 months or sooner if needed.  Damon Torres is scheduled for ENDOVASCULAR REPAIR/STENT GRAFT OF INFRARENAL AAA on 01/01/2022 with Dr. Festus Barren, MD.  Given patient's past medical history significant for cardiovascular diagnoses, presurgical cardiac clearance was sought by the PAT team. Per cardiology, "this patient is optimized for surgery and may proceed with the planned procedural course with a MODERATE risk of significant perioperative cardiovascular complications".   Again, this patient is on daily oral anticoagulation therapy  using a DOAC medication. He has been instructed on recommendations for holding his rivaroxaban for 2 days prior to his procedure with plans to restart as soon as postoperative bleeding risk felt to be minimized by his attending surgeon. The patient has been instructed that his last dose of his rivaroxaban should be on 12/30/2022.  Patient denies previous perioperative complications with anesthesia in the past. In review of the available records, it is noted that patient underwent a MAC anesthetic course at [facility] in 09/2022 without documented complications.      12/24/2022    9:18 AM 12/15/2022    8:56 AM 12/15/2022    8:53 AM  Vitals with BMI  Height 6\' 2"   6\' 2"   Weight 174 lbs  174 lbs  BMI 22.33  22.33  Systolic  145   Diastolic  74   Pulse  63     Providers/Specialists:   NOTE: Primary physician provider listed below. Patient may have been seen by APP or partner within same practice.   PROVIDER ROLE / SPECIALTY LAST Shanna Cisco, MD Vascular Surgery (Surgeon) 10/02/2022  Karie Schwalbe, MD Primary Care Provider 07/29/2022  Marguarite Arbour, MD Cardiology 11/02/2022   Allergies:  Patient has no known allergies.  Current Home Medications:   No current facility-administered medications for this encounter.    dapagliflozin propanediol (FARXIGA) 10 MG TABS tablet   furosemide (LASIX) 40 MG tablet   HUMIRA PEN 40 MG/0.4ML PNKT   ketoconazole (NIZORAL) 2 % shampoo   levothyroxine (SYNTHROID) 75 MCG tablet   lipase/protease/amylase (CREON) 36000 UNITS CPEP capsule   Rivaroxaban (XARELTO) 15 MG TABS tablet   rosuvastatin (CRESTOR) 10 MG tablet   sacubitril-valsartan (ENTRESTO) 24-26 MG   spironolactone (ALDACTONE) 25 MG tablet  History:   Past Medical History:  Diagnosis Date   Acquired thrombophilia (HCC) 05/19/2020   Aortic atherosclerosis (HCC)    Atrial fibrillation (HCC) 04/04/2013   a.) CHA2DS2VASc = 5 (age x2, CHF, HTN,  vascular disease history);  b.) rate/rhythm maintained intrinsically without pharmacological intervention; chronically anticoagulated with rivaroxaban   Atrioventricular node dysfunction 01/08/2020   Bilateral elbow joint pain 12/16/2020   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   Chronic venous insufficiency 05/24/2021   Erectile dysfunction due to arterial insufficiency 05/06/2019   Gastric nodule 03/28/2016   Hallux valgus, left 02/26/2012   HFrEF (heart failure with reduced ejection fraction) (HCC) 10/12/2022   a.) TTE 10/12/2022: EF 40-45%, glob HK, mild-mod LV dil, mod LAE, sev RAE, mod TR/MR, mild-mod AR, PASP 52.9   Hyperlipidemia, mixed 01/06/2019   Hypertension 07/01/2012   Infrarenal abdominal aortic aneurysm (AAA) without rupture (HCC) 07/11/2014   a.) CT AP 07/11/2014: 3.8 cm; b.) MRI abd 05/31/2015: 4.0cm; c.) AAA duplex 03/17/2019: 3.7cm; d.) AAA duplex 03/18/2020: 4.2cm; e.) AAA duplex 03/17/2021: 4.4cm; f.) AAA duplex 03/20/2022: 4.6cm; g.) 09/18/2022: 5.0cm; h.) CTA AP 09/26/2022: 5.3 x 5.1cm   Long term current use of anticoagulant    a.) rivaroxaban   Long term current use of immunosuppressive drug    a.) adalimumab for RA   Long-term corticosteroid use    a.) prednisione for RA   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   Macrocytosis 09/29/2015   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   Pancreatic insufficiency 09/29/2015   a.) on lipase/protease/amylase with meals (4 capsules TID) and with snacks (2 capsules)   Pelvic floor dysfunction 09/29/2015   Penile lesion 05/06/2019   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   Pulmonary hypertension (HCC) 10/12/2022   a.) TTE 10/12/2022: RVSP 52.9 mmHg   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; Tx'd at [facility] with systemic antineoplastic chemotherapy + XRT   Rheumatoid arthritis involving multiple joints (HCC) 08/29/2011   a.) Tx'd with adalimumab + prednisone   Right lower lobe  pulmonary nodule 11/20/2022   a.) cCTA 11/20/2022: indeterminant 4 mm   Stage 3 chronic kidney disease (HCC) 02/12/2019   Subclinical hypothyroidism 06/04/2012   Tachycardia-bradycardia syndrome (HCC)    Venous stasis ulcer (HCC) 08/27/2022   Past Surgical History:  Procedure Laterality Date   CARDIOVASCULAR STRESS TEST  10/19/2022   CATARACT EXTRACTION W/PHACO Right 02/14/2016   Procedure: CATARACT EXTRACTION PHACO AND INTRAOCULAR LENS PLACEMENT (IOC);  Surgeon: Galen Manila, MD;  Location: [facility] ORS;  Service: Ophthalmology;  Laterality: Right;  [place] 01:02 AP% 18.7 CDE 11.59 Fluid pack lot # [id number]   CATARACT EXTRACTION W/PHACO Left 02/07/2021   Procedure: CATARACT EXTRACTION PHACO AND INTRAOCULAR LENS PLACEMENT (IOC) LEFT;  Surgeon: Galen Manila, MD;  Location: [facility];  Service: Ophthalmology;  Laterality: Left;  9.78 0:49.8   COLONOSCOPY     COLONOSCOPY WITH PROPOFOL N/A 01/27/2015   Procedure: COLONOSCOPY WITH PROPOFOL;  Surgeon: Elnita Maxwell, MD;  Location: [facility] END[facility]COPY;  Service: Endoscopy;  Laterality: N/A;   DOPPLER ECHOCARDIOGRAPHY Bilateral 10/12/2022   Right foot surgery Right 07/05/2012   toes pinned   TRANSANAL RECTAL RESECTION  06/04/1998   [facility]   VARICOSE VEIN SURGERY  06/04/1977   Family History  Problem Relation Age of Onset   Liver cancer Mother    Breast cancer Mother    Heart disease Mother    Heart disease Brother        CABG and valve repair  Hypertension Brother    Colon cancer Neg Hx    Esophageal cancer Neg Hx    Rectal cancer Neg Hx    Stomach cancer Neg Hx    Diabetes Neg Hx    Social History   Tobacco Use   Smoking status: Former    Passive exposure: Past   Smokeless tobacco: Never  Substance Use Topics   Alcohol use: No   Drug use: No    Pertinent Clinical Results:  LABS:   No visits with results within 3 Day(s) from this visit.  Latest known visit with results is:  Hospital Outpatient Visit on  12/24/2022  Component Date Value Ref Range Status   ABO/RH(D) 12/24/2022 O NEG   Final   Antibody Screen 12/24/2022 NEG   Final   Sample Expiration 12/24/2022 01/07/2023,2359   Final   Extend sample reason 12/24/2022    Final                   Value:NO TRANSFUSIONS OR PREGNANCY IN THE PAST 3 MONTHS Performed at [facility], [address] [place], [place] [address]    Sodium 12/24/2022 135  135 - 145 mmol/L Final   Potassium 12/24/2022 5.1  3.5 - 5.1 mmol/L Final   Chloride 12/24/2022 104  98 - 111 mmol/L Final   CO2 12/24/2022 25  22 - 32 mmol/L Final   Glucose, Bld 12/24/2022 129 (H)  70 - 99 mg/dL Final   Glucose reference range applies only to samples taken after fasting for at least 8 hours.   BUN 12/24/2022 46 (H)  8 - 23 mg/dL Final   Creatinine, Ser 12/24/2022 1.65 (H)  0.61 - 1.24 mg/dL Final   Calcium 44/31/5400 8.6 (L)  8.9 - 10.3 mg/dL Final   GFR, Estimated 12/24/2022 41 (L)  >60 mL/min Final   Comment: (NOTE) Calculated using the CKD-EPI Creatinine Equation (2021)    Anion gap 12/24/2022 6  5 - 15 Final   Performed at [facility], [address] [place], [place] [address]   WBC 12/24/2022 8.2  4.0 - 10.5 K/uL Final   RBC 12/24/2022 3.30 (L)  4.22 - 5.81 MIL/uL Final   Hemoglobin 12/24/2022 11.7 (L)  13.0 - 17.0 g/dL Final   HCT 95/02/3266 34.0 (L)  39.0 - 52.0 % Final   MCV 12/24/2022 103.0 (H)  80.0 - 100.0 fL Final   MCH 12/24/2022 35.5 (H)  26.0 - 34.0 pg Final   MCHC 12/24/2022 34.4  30.0 - 36.0 g/dL Final   RDW 12/45/8099 14.1  11.5 - 15.5 % Final   Platelets 12/24/2022 152  150 - 400 K/uL Final   nRBC 12/24/2022 0.0  0.0 - 0.2 % Final   Neutrophils Relative % 12/24/2022 48  % Final   Neutro Abs 12/24/2022 4.0  1.7 - 7.7 K/uL Final   Lymphocytes Relative 12/24/2022 34  % Final   Lymphs Abs 12/24/2022 2.8  0.7 - 4.0 K/uL Final   Monocytes Relative 12/24/2022 14  % Final   Monocytes Absolute 12/24/2022 1.1 (H)  0.1 - 1.0 K/uL Final    Eosinophils Relative 12/24/2022 2  % Final   Eosinophils Absolute 12/24/2022 0.2  0.0 - 0.5 K/uL Final   Basophils Relative 12/24/2022 1  % Final   Basophils Absolute 12/24/2022 0.1  0.0 - 0.1 K/uL Final   Immature Granulocytes 12/24/2022 1  % Final   Abs Immature Granulocytes 12/24/2022 0.08 (H)  0.00 - 0.07 K/uL Final  Performed at [facility], [address] [place], [place] [address]   MRSA, PCR 12/24/2022 NEGATIVE  NEGATIVE Final   Staphylococcus aureus 12/24/2022 NEGATIVE  NEGATIVE Final   Comment: (NOTE) The Xpert SA Assay (FDA approved for NASAL specimens in patients 20 years of age and older), is one component of a comprehensive surveillance program. It is not intended to diagnose infection nor to guide or monitor treatment. Performed at [facility], [address] [place], [place] [address]     ECG: Date: 12/24/2022 Time ECG obtained: 1207 PM Rate: 44 bpm Rhythm:  Atrial fibrillation with slow ventricular response Axis (leads I and aVF): Normal Intervals: QRS 98 ms. QTc 389 ms. ST segment and T wave changes: No evidence of acute ST segment elevation or depression.  Borderline criteria for lateral infarct no longer present.  Present. Comparison: Similar to previous tracing obtained on 01/16/2021   IMAGING / PROCEDURES: CT HEART ANGIOGRAM WITH CT FFR performed on 11/20/2022 Approximately 30% stenosis in the distal RCA due to calcified and noncalcified plaque. Otherwise, intermediate stenoses of less than 25% in  the RCA, LAD, and proximal left circumflex artery due to calcified plaque. ] Calcium score: 784. Total calcium score is at the 59th percentile adjusted for age and gender.  CT FFR Analysis: CT FFR will not be performed for this study.  Indeterminate 4 mm solid pulmonary nodule in the right lower lobe. Given smoking history, recommend follow-up chest CT in 1 year to document stability. (2017 Fleischner Guidelines)   TRANSTHORACIC  ECHOCARDIOGRAM performed on 10/12/2022 Left ventricular ejection fraction, by estimation, is 40 to 45%. Left ventricular ejection fraction by 2D MOD biplane is 43.5 %. The left ventricle has mildly decreased function. The left ventricle demonstrates global hypokinesis. The left  ventricular internal cavity size was mildly to moderately dilated. Left  ventricular diastolic parameters are indeterminate.  Right ventricular systolic function is normal. The right ventricular size is normal. There is moderately elevated pulmonary artery systolic pressure. The estimated right ventricular systolic pressure is 52.9 mmHg.  Left atrial size was moderately dilated.  Right atrial size was severely dilated.  The mitral valve is normal in structure. Moderate mitral valve regurgitation. No evidence of mitral stenosis.  Tricuspid valve regurgitation is moderate.  The aortic valve is tricuspid. Aortic valve regurgitation is mild to moderate. Aortic valve sclerosis/calcification is present, without any evidence of aortic stenosis.  The inferior vena cava is normal in size with greater than 50% respiratory variability, suggesting right atrial pressure of 3 mmHg.  CT ANGIO ABD/PEL W/ AND/OR W/O performed on 09/26/2022 Infrarenal abdominal aortic aneurysm measuring up to 5.3 x 5.1 cm. Recommend follow-up every 6 months and vascular consultation.This recommendation follows ACR consensus guidelines: White Paper of the ACR Incidental Findings Committee II on Vascular Findings. J Am Coll Radiol 2013; 10:789-794. No acute abnormality of the abdomen or pelvis. 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 Aortic atherosclerosis   VAS [place] AAA DUPLEX performed on 09/18/2022 There is evidence of abnormal dilatation of the distal  abdominal aorta.  The largest aortic measurement is 5.0 cm.  Mild atherosclerosis throughout.  The largest aortic diameter has increased  compared to prior exam. Previous diameter  measurement was 4.8 cm obtained on 03/2022.   Impression and Plan:  Damon Torres has been referred for pre-anesthesia review and clearance prior to him undergoing the planned anesthetic and procedural courses. Available labs, pertinent testing, and imaging results were personally reviewed by me  in preparation for upcoming operative/procedural course. [facility] medical record has been updated following extensive record review and patient interview with PAT staff.   This patient has been appropriately cleared by cardiology with an overall MODERATE risk of experiencing significant perioperative cardiovascular complications. Based on clinical review performed today (12/31/22), barring any significant acute changes in the patient's overall condition, it is anticipated that he will be able to proceed with the planned surgical intervention. Any acute changes in clinical condition may necessitate his procedure being postponed and/or cancelled. Patient will meet with anesthesia team (MD and/or CRNA) on the day of his procedure for preoperative evaluation/assessment. Questions regarding anesthetic course will be fielded at that time.   Pre-surgical instructions were reviewed with the patient during his PAT appointment, and questions were fielded to satisfaction by PAT clinical staff. He has been instructed on which medications that he will need to hold prior to surgery, as well as the ones that have been deemed safe/appropriate to take on the day of his procedure. As part of the general education provided by PAT, patient made aware both verbally and in writing, that he would need to abstain from the use of any illegal substances during his perioperative course.  He was advised that failure to follow the provided instructions could necessitate case cancellation or result in serious perioperative complications up to and including death. Patient encouraged to contact PAT and/or his surgeon's office to discuss any  questions or concerns that may arise prior to surgery; verbalized understanding.   Quentin Mulling, MSN, APRN, FNP-C, CEN [facility]  Peri-operative Services Nurse Practitioner Phone: [phone] Fax: [phone] 12/31/22 8:37 AM  NOTE: This note has been prepared using Dragon dictation software. Despite my best ability to proofread, there is always the potential that unintentional transcriptional errors may still occur from this process.

## 2022-12-28 ENCOUNTER — Encounter: Payer: Self-pay | Admitting: Vascular Surgery

## 2023-01-02 ENCOUNTER — Inpatient Hospital Stay: Payer: Medicare Other | Admitting: Urgent Care

## 2023-01-02 ENCOUNTER — Inpatient Hospital Stay
Admission: RE | Admit: 2023-01-02 | Discharge: 2023-01-04 | DRG: 269 | Disposition: A | Discharge status: Home / Self Care | Payer: Medicare Other | Attending: Vascular Surgery | Admitting: Vascular Surgery

## 2023-01-02 ENCOUNTER — Encounter
Admission: RE | Disposition: A | Discharge status: Home / Self Care | Payer: Self-pay | Source: Home / Self Care | Attending: Vascular Surgery

## 2023-01-02 ENCOUNTER — Other Ambulatory Visit: Payer: Self-pay

## 2023-01-02 DIAGNOSIS — I714 Abdominal aortic aneurysm, without rupture, unspecified: Secondary | ICD-10-CM

## 2023-01-02 DIAGNOSIS — I4891 Unspecified atrial fibrillation: Secondary | ICD-10-CM | POA: Diagnosis present

## 2023-01-02 DIAGNOSIS — Z9841 Cataract extraction status, right eye: Secondary | ICD-10-CM | POA: Diagnosis not present

## 2023-01-02 DIAGNOSIS — Z803 Family history of malignant neoplasm of breast: Secondary | ICD-10-CM | POA: Diagnosis not present

## 2023-01-02 DIAGNOSIS — Q272 Other congenital malformations of renal artery: Secondary | ICD-10-CM | POA: Diagnosis not present

## 2023-01-02 DIAGNOSIS — Z9842 Cataract extraction status, left eye: Secondary | ICD-10-CM

## 2023-01-02 DIAGNOSIS — Z7952 Long term (current) use of systemic steroids: Secondary | ICD-10-CM | POA: Diagnosis not present

## 2023-01-02 DIAGNOSIS — N401 Enlarged prostate with lower urinary tract symptoms: Secondary | ICD-10-CM | POA: Diagnosis present

## 2023-01-02 DIAGNOSIS — I7143 Infrarenal abdominal aortic aneurysm, without rupture: Secondary | ICD-10-CM

## 2023-01-02 DIAGNOSIS — Z87891 Personal history of nicotine dependence: Secondary | ICD-10-CM | POA: Diagnosis not present

## 2023-01-02 DIAGNOSIS — Z961 Presence of intraocular lens: Secondary | ICD-10-CM | POA: Diagnosis present

## 2023-01-02 DIAGNOSIS — I130 Hypertensive heart and chronic kidney disease with heart failure and stage 1 through stage 4 chronic kidney disease, or unspecified chronic kidney disease: Secondary | ICD-10-CM | POA: Diagnosis present

## 2023-01-02 DIAGNOSIS — I700 Atherosclerosis of aorta: Secondary | ICD-10-CM | POA: Diagnosis present

## 2023-01-02 DIAGNOSIS — I872 Venous insufficiency (chronic) (peripheral): Secondary | ICD-10-CM | POA: Diagnosis present

## 2023-01-02 DIAGNOSIS — Z800 Family history of malignant neoplasm of digestive organs: Secondary | ICD-10-CM

## 2023-01-02 DIAGNOSIS — M069 Rheumatoid arthritis, unspecified: Secondary | ICD-10-CM | POA: Diagnosis present

## 2023-01-02 DIAGNOSIS — I5022 Chronic systolic (congestive) heart failure: Secondary | ICD-10-CM | POA: Diagnosis present

## 2023-01-02 DIAGNOSIS — Z7960 Long term (current) use of unspecified immunomodulators and immunosuppressants: Secondary | ICD-10-CM | POA: Diagnosis not present

## 2023-01-02 DIAGNOSIS — Z7901 Long term (current) use of anticoagulants: Secondary | ICD-10-CM | POA: Diagnosis not present

## 2023-01-02 DIAGNOSIS — N5201 Erectile dysfunction due to arterial insufficiency: Secondary | ICD-10-CM | POA: Diagnosis present

## 2023-01-02 DIAGNOSIS — I713 Abdominal aortic aneurysm, ruptured, unspecified: Secondary | ICD-10-CM | POA: Diagnosis present

## 2023-01-02 DIAGNOSIS — N138 Other obstructive and reflux uropathy: Secondary | ICD-10-CM | POA: Diagnosis present

## 2023-01-02 DIAGNOSIS — E782 Mixed hyperlipidemia: Secondary | ICD-10-CM | POA: Diagnosis present

## 2023-01-02 DIAGNOSIS — I272 Pulmonary hypertension, unspecified: Secondary | ICD-10-CM | POA: Diagnosis present

## 2023-01-02 DIAGNOSIS — Z85048 Personal history of other malignant neoplasm of rectum, rectosigmoid junction, and anus: Secondary | ICD-10-CM

## 2023-01-02 DIAGNOSIS — Z8249 Family history of ischemic heart disease and other diseases of the circulatory system: Secondary | ICD-10-CM

## 2023-01-02 DIAGNOSIS — I10 Essential (primary) hypertension: Secondary | ICD-10-CM

## 2023-01-02 DIAGNOSIS — N1832 Chronic kidney disease, stage 3b: Secondary | ICD-10-CM | POA: Diagnosis present

## 2023-01-02 DIAGNOSIS — Z01818 Encounter for other preprocedural examination: Secondary | ICD-10-CM

## 2023-01-02 HISTORY — DX: Long term (current) use of anticoagulants: Z79.01

## 2023-01-02 HISTORY — DX: Sick sinus syndrome: I49.5

## 2023-01-02 HISTORY — DX: Long term (current) use of unspecified immunomodulators and immunosuppressants: Z79.60

## 2023-01-02 HISTORY — DX: Atherosclerosis of aorta: I70.0

## 2023-01-02 HISTORY — DX: Long term (current) use of systemic steroids: Z79.52

## 2023-01-02 HISTORY — DX: Other long term (current) drug therapy: Z79.899

## 2023-01-02 HISTORY — PX: ENDOVASCULAR STENT GRAFT (AAA): CATH118280

## 2023-01-02 LAB — CBC
HCT: 33 % — ABNORMAL LOW (ref 39.0–52.0)
Hemoglobin: 11 g/dL — ABNORMAL LOW (ref 13.0–17.0)
MCH: 35.3 pg — ABNORMAL HIGH (ref 26.0–34.0)
MCHC: 33.3 g/dL (ref 30.0–36.0)
MCV: 105.8 fL — ABNORMAL HIGH (ref 80.0–100.0)
Platelets: 141 10*3/uL — ABNORMAL LOW (ref 150–400)
RBC: 3.12 MIL/uL — ABNORMAL LOW (ref 4.22–5.81)
RDW: 13.5 % (ref 11.5–15.5)
WBC: 7.1 10*3/uL (ref 4.0–10.5)
nRBC: 0 % (ref 0.0–0.2)

## 2023-01-02 LAB — PROTIME-INR
INR: 1.3 — ABNORMAL HIGH (ref 0.8–1.2)
Prothrombin Time: 16.1 seconds — ABNORMAL HIGH (ref 11.4–15.2)

## 2023-01-02 LAB — BASIC METABOLIC PANEL
Anion gap: 5 (ref 5–15)
BUN: 41 mg/dL — ABNORMAL HIGH (ref 8–23)
CO2: 21 mmol/L — ABNORMAL LOW (ref 22–32)
Calcium: 8.4 mg/dL — ABNORMAL LOW (ref 8.9–10.3)
Chloride: 109 mmol/L (ref 98–111)
Creatinine, Ser: 1.68 mg/dL — ABNORMAL HIGH (ref 0.61–1.24)
GFR, Estimated: 40 mL/min — ABNORMAL LOW (ref >60)
Glucose, Bld: 124 mg/dL — ABNORMAL HIGH (ref 70–99)
Potassium: 4.8 mmol/L (ref 3.5–5.1)
Sodium: 135 mmol/L (ref 135–145)

## 2023-01-02 LAB — GLUCOSE, CAPILLARY
Glucose-Capillary: 106 mg/dL — ABNORMAL HIGH (ref 70–99)
Glucose-Capillary: 113 mg/dL — ABNORMAL HIGH (ref 70–99)
Glucose-Capillary: 145 mg/dL — ABNORMAL HIGH (ref 70–99)

## 2023-01-02 LAB — MRSA NEXT GEN BY PCR, NASAL: MRSA by PCR Next Gen: NOT DETECTED

## 2023-01-02 LAB — APTT: aPTT: 108 seconds — ABNORMAL HIGH (ref 24–36)

## 2023-01-02 LAB — MAGNESIUM: Magnesium: 2 mg/dL (ref 1.7–2.4)

## 2023-01-02 SURGERY — ENDOVASCULAR STENT GRAFT (AAA)
Anesthesia: General

## 2023-01-02 MED ORDER — ONDANSETRON HCL 4 MG/2ML IJ SOLN: 4.0000 mg | Freq: Four times a day (QID) | INTRAMUSCULAR | Status: DC | PRN

## 2023-01-02 MED ORDER — SODIUM CHLORIDE 0.9 % IV SOLN: INTRAVENOUS | Status: DC

## 2023-01-02 MED ORDER — CEFAZOLIN SODIUM-DEXTROSE 2-4 GM/100ML-% IV SOLN
2.0000 g | INTRAVENOUS | Status: AC
  Administered 2023-01-02: 2 g via INTRAVENOUS

## 2023-01-02 MED ORDER — CHLORHEXIDINE GLUCONATE CLOTH 2 % EX PADS: 6.0000 | MEDICATED_PAD | Freq: Once | CUTANEOUS | Status: DC

## 2023-01-02 MED ORDER — DEXAMETHASONE SODIUM PHOSPHATE 10 MG/ML IJ SOLN
INTRAMUSCULAR | Status: DC | PRN
  Administered 2023-01-02: 10 mg via INTRAVENOUS

## 2023-01-02 MED ORDER — SPIRONOLACTONE 25 MG PO TABS: 25.0000 mg | ORAL_TABLET | Freq: Every morning | ORAL | Status: DC

## 2023-01-02 MED ORDER — LABETALOL HCL 5 MG/ML IV SOLN
INTRAVENOUS | Status: AC
  Filled 2023-01-02: qty 4

## 2023-01-02 MED ORDER — ACETAMINOPHEN 325 MG PO TABS: 325.0000 mg | ORAL_TABLET | ORAL | Status: DC | PRN

## 2023-01-02 MED ORDER — LACTATED RINGERS IV SOLN: INTRAVENOUS | Status: DC | PRN

## 2023-01-02 MED ORDER — HYDROMORPHONE HCL 1 MG/ML IJ SOLN
1.0000 mg | INTRAMUSCULAR | Status: DC | PRN
  Administered 2023-01-02: 1 mg via INTRAVENOUS
  Filled 2023-01-02: qty 1

## 2023-01-02 MED ORDER — ROCURONIUM BROMIDE 10 MG/ML (PF) SYRINGE
PREFILLED_SYRINGE | INTRAVENOUS | Status: AC
  Filled 2023-01-02: qty 10

## 2023-01-02 MED ORDER — LACTATED RINGERS IV SOLN: INTRAVENOUS | Status: DC

## 2023-01-02 MED ORDER — SUCCINYLCHOLINE CHLORIDE 200 MG/10ML IV SOSY
PREFILLED_SYRINGE | INTRAVENOUS | Status: AC
  Filled 2023-01-02: qty 10

## 2023-01-02 MED ORDER — ACETAMINOPHEN 650 MG RE SUPP: 325.0000 mg | RECTAL | Status: DC | PRN

## 2023-01-02 MED ORDER — CHLORHEXIDINE GLUCONATE CLOTH 2 % EX PADS
6.0000 | MEDICATED_PAD | Freq: Once | CUTANEOUS | Status: AC
  Administered 2023-01-02: 6 via TOPICAL

## 2023-01-02 MED ORDER — GLYCOPYRROLATE 0.2 MG/ML IJ SOLN
INTRAMUSCULAR | Status: DC | PRN
  Administered 2023-01-02: .2 mg via INTRAVENOUS

## 2023-01-02 MED ORDER — ROCURONIUM BROMIDE 100 MG/10ML IV SOLN
INTRAVENOUS | Status: DC | PRN
  Administered 2023-01-02: 50 mg via INTRAVENOUS

## 2023-01-02 MED ORDER — OXYCODONE HCL 5 MG/5ML PO SOLN
5.0000 mg | Freq: Once | ORAL | Status: DC | PRN
  Filled 2023-01-02: qty 5

## 2023-01-02 MED ORDER — FENTANYL CITRATE (PF) 100 MCG/2ML IJ SOLN
25.0000 ug | INTRAMUSCULAR | Status: DC | PRN
  Administered 2023-01-02 (×4): 25 ug via INTRAVENOUS

## 2023-01-02 MED ORDER — LABETALOL HCL 5 MG/ML IV SOLN
INTRAVENOUS | Status: DC | PRN
  Administered 2023-01-02: 5 mg via INTRAVENOUS

## 2023-01-02 MED ORDER — LABETALOL HCL 5 MG/ML IV SOLN: 10.0000 mg | INTRAVENOUS | Status: DC | PRN

## 2023-01-02 MED ORDER — ALUM & MAG HYDROXIDE-SIMETH 200-200-20 MG/5ML PO SUSP: 15.0000 mL | ORAL | Status: DC | PRN

## 2023-01-02 MED ORDER — EPHEDRINE SULFATE (PRESSORS) 50 MG/ML IJ SOLN
INTRAMUSCULAR | Status: DC | PRN
  Administered 2023-01-02 (×2): 5 mg via INTRAVENOUS

## 2023-01-02 MED ORDER — SUCCINYLCHOLINE CHLORIDE 200 MG/10ML IV SOSY
PREFILLED_SYRINGE | INTRAVENOUS | Status: DC | PRN
  Administered 2023-01-02: 100 mg via INTRAVENOUS

## 2023-01-02 MED ORDER — DEXAMETHASONE SODIUM PHOSPHATE 10 MG/ML IJ SOLN
INTRAMUSCULAR | Status: AC
  Filled 2023-01-02: qty 1

## 2023-01-02 MED ORDER — OXYCODONE HCL 5 MG PO TABS: 5.0000 mg | ORAL_TABLET | Freq: Once | ORAL | Status: DC | PRN

## 2023-01-02 MED ORDER — FENTANYL CITRATE (PF) 100 MCG/2ML IJ SOLN
INTRAMUSCULAR | Status: AC
  Filled 2023-01-02: qty 2

## 2023-01-02 MED ORDER — PANCRELIPASE (LIP-PROT-AMYL) 12000-38000 UNITS PO CPEP: 72000.0000 [IU] | ORAL_CAPSULE | Freq: Three times a day (TID) | ORAL | Status: DC | PRN

## 2023-01-02 MED ORDER — HEPARIN SODIUM (PORCINE) 1000 UNIT/ML IJ SOLN
INTRAMUSCULAR | Status: DC | PRN
  Administered 2023-01-02: 6000 [IU] via INTRAVENOUS

## 2023-01-02 MED ORDER — PROPOFOL 10 MG/ML IV BOLUS
INTRAVENOUS | Status: AC
  Filled 2023-01-02: qty 20

## 2023-01-02 MED ORDER — HYDRALAZINE HCL 20 MG/ML IJ SOLN
5.0000 mg | INTRAMUSCULAR | Status: AC | PRN
  Administered 2023-01-02 (×2): 5 mg via INTRAVENOUS
  Filled 2023-01-02 (×2): qty 1

## 2023-01-02 MED ORDER — OXYCODONE-ACETAMINOPHEN 5-325 MG PO TABS
1.0000 | ORAL_TABLET | ORAL | Status: DC | PRN
  Administered 2023-01-02 – 2023-01-03 (×2): 2 via ORAL
  Filled 2023-01-02 (×2): qty 2

## 2023-01-02 MED ORDER — ROSUVASTATIN CALCIUM 10 MG PO TABS
10.0000 mg | ORAL_TABLET | Freq: Every day | ORAL | Status: DC
  Administered 2023-01-03: 10 mg via ORAL
  Filled 2023-01-02: qty 1

## 2023-01-02 MED ORDER — INSULIN ASPART 100 UNIT/ML IJ SOLN
0.0000 [IU] | Freq: Three times a day (TID) | INTRAMUSCULAR | Status: DC
  Administered 2023-01-03: 2 [IU] via SUBCUTANEOUS
  Filled 2023-01-02: qty 1

## 2023-01-02 MED ORDER — MAGNESIUM SULFATE 2 GM/50ML IV SOLN: 2.0000 g | Freq: Every day | INTRAVENOUS | Status: DC | PRN

## 2023-01-02 MED ORDER — CEFAZOLIN SODIUM-DEXTROSE 2-4 GM/100ML-% IV SOLN
INTRAVENOUS | Status: AC
  Filled 2023-01-02: qty 100

## 2023-01-02 MED ORDER — GUAIFENESIN-DM 100-10 MG/5ML PO SYRP
15.0000 mL | ORAL_SOLUTION | ORAL | Status: DC | PRN
  Administered 2023-01-04: 15 mL via ORAL
  Filled 2023-01-02: qty 20

## 2023-01-02 MED ORDER — FENTANYL CITRATE (PF) 100 MCG/2ML IJ SOLN
INTRAMUSCULAR | Status: DC | PRN
  Administered 2023-01-02: 25 ug via INTRAVENOUS
  Administered 2023-01-02: 50 ug via INTRAVENOUS
  Administered 2023-01-02: 25 ug via INTRAVENOUS

## 2023-01-02 MED ORDER — LIDOCAINE HCL (CARDIAC) PF 100 MG/5ML IV SOSY
PREFILLED_SYRINGE | INTRAVENOUS | Status: DC | PRN
  Administered 2023-01-02: 100 mg via INTRAVENOUS

## 2023-01-02 MED ORDER — PANCRELIPASE (LIP-PROT-AMYL) 12000-38000 UNITS PO CPEP: 36000.0000 [IU] | ORAL_CAPSULE | Freq: Three times a day (TID) | ORAL | Status: DC

## 2023-01-02 MED ORDER — ORAL CARE MOUTH RINSE: 15.0000 mL | Freq: Once | OROMUCOSAL | Status: AC

## 2023-01-02 MED ORDER — CEFAZOLIN SODIUM-DEXTROSE 2-4 GM/100ML-% IV SOLN
2.0000 g | Freq: Three times a day (TID) | INTRAVENOUS | Status: AC
  Administered 2023-01-02 – 2023-01-03 (×2): 2 g via INTRAVENOUS
  Filled 2023-01-02 (×2): qty 100

## 2023-01-02 MED ORDER — PHENYLEPHRINE HCL-NACL 20-0.9 MG/250ML-% IV SOLN
INTRAVENOUS | Status: DC | PRN
Start: 2023-01-02 — End: 2023-01-02
  Administered 2023-01-02: 30 ug/min via INTRAVENOUS

## 2023-01-02 MED ORDER — PHENOL 1.4 % MT LIQD
1.0000 | OROMUCOSAL | Status: DC | PRN
  Administered 2023-01-02: 1 via OROMUCOSAL
  Filled 2023-01-02: qty 177

## 2023-01-02 MED ORDER — FAMOTIDINE IN NACL 20-0.9 MG/50ML-% IV SOLN
20.0000 mg | Freq: Two times a day (BID) | INTRAVENOUS | Status: DC
  Administered 2023-01-02 – 2023-01-03 (×3): 20 mg via INTRAVENOUS
  Filled 2023-01-02 (×3): qty 50

## 2023-01-02 MED ORDER — IODIXANOL 320 MG/ML IV SOLN
INTRAVENOUS | Status: DC | PRN
  Administered 2023-01-02: 65 mL via INTRA_ARTERIAL

## 2023-01-02 MED ORDER — ONDANSETRON HCL 4 MG/2ML IJ SOLN
INTRAMUSCULAR | Status: AC
  Filled 2023-01-02: qty 2

## 2023-01-02 MED ORDER — MORPHINE SULFATE (PF) 2 MG/ML IV SOLN
2.0000 mg | INTRAVENOUS | Status: DC | PRN
  Administered 2023-01-02: 2 mg via INTRAVENOUS
  Filled 2023-01-02: qty 1

## 2023-01-02 MED ORDER — ASPIRIN 81 MG PO TBEC
81.0000 mg | DELAYED_RELEASE_TABLET | Freq: Every day | ORAL | Status: DC
  Administered 2023-01-03: 81 mg via ORAL
  Filled 2023-01-02 (×2): qty 1

## 2023-01-02 MED ORDER — DOCUSATE SODIUM 100 MG PO CAPS
100.0000 mg | ORAL_CAPSULE | Freq: Every day | ORAL | Status: DC
  Administered 2023-01-03: 100 mg via ORAL
  Filled 2023-01-02: qty 1

## 2023-01-02 MED ORDER — SUGAMMADEX SODIUM 200 MG/2ML IV SOLN
INTRAVENOUS | Status: DC | PRN
  Administered 2023-01-02: 160 mg via INTRAVENOUS

## 2023-01-02 MED ORDER — ETOMIDATE 2 MG/ML IV SOLN
INTRAVENOUS | Status: AC
  Filled 2023-01-02: qty 10

## 2023-01-02 MED ORDER — METOPROLOL TARTRATE 5 MG/5ML IV SOLN: 2.0000 mg | INTRAVENOUS | Status: DC | PRN

## 2023-01-02 MED ORDER — DROPERIDOL 2.5 MG/ML IJ SOLN
0.6250 mg | Freq: Once | INTRAMUSCULAR | Status: DC | PRN
  Filled 2023-01-02: qty 2

## 2023-01-02 MED ORDER — LIDOCAINE HCL (PF) 2 % IJ SOLN
INTRAMUSCULAR | Status: AC
  Filled 2023-01-02: qty 5

## 2023-01-02 MED ORDER — HEPARIN (PORCINE) IN NACL 2000-0.9 UNIT/L-% IV SOLN
INTRAVENOUS | Status: DC | PRN
  Administered 2023-01-02: 1500 mL

## 2023-01-02 MED ORDER — ETOMIDATE 2 MG/ML IV SOLN
INTRAVENOUS | Status: DC | PRN
  Administered 2023-01-02: 14 mg via INTRAVENOUS

## 2023-01-02 MED ORDER — BUDESONIDE 3 MG PO CPEP
6.0000 mg | ORAL_CAPSULE | Freq: Every day | ORAL | Status: DC
  Administered 2023-01-02 – 2023-01-03 (×2): 6 mg via ORAL
  Filled 2023-01-02 (×3): qty 2

## 2023-01-02 MED ORDER — DOPAMINE-DEXTROSE 3.2-5 MG/ML-% IV SOLN: 3.0000 ug/kg/min | INTRAVENOUS | Status: DC

## 2023-01-02 MED ORDER — ONDANSETRON HCL 4 MG/2ML IJ SOLN
INTRAMUSCULAR | Status: DC | PRN
  Administered 2023-01-02: 4 mg via INTRAVENOUS

## 2023-01-02 MED ORDER — NITROGLYCERIN IN D5W 200-5 MCG/ML-% IV SOLN: 5.0000 ug/min | INTRAVENOUS | Status: DC

## 2023-01-02 MED ORDER — HEPARIN SODIUM (PORCINE) 1000 UNIT/ML IJ SOLN
INTRAMUSCULAR | Status: AC
  Filled 2023-01-02: qty 10

## 2023-01-02 MED ORDER — PROPOFOL 10 MG/ML IV BOLUS
INTRAVENOUS | Status: DC | PRN
  Administered 2023-01-02: 20 mg via INTRAVENOUS

## 2023-01-02 MED ORDER — PANCRELIPASE (LIP-PROT-AMYL) 36000-114000 UNITS PO CPEP
144000.0000 [IU] | ORAL_CAPSULE | Freq: Three times a day (TID) | ORAL | Status: DC
  Administered 2023-01-03 – 2023-01-04 (×3): 144000 [IU] via ORAL
  Filled 2023-01-02 (×7): qty 4

## 2023-01-02 MED ORDER — PHENYLEPHRINE HCL-NACL 20-0.9 MG/250ML-% IV SOLN
INTRAVENOUS | Status: AC
  Filled 2023-01-02: qty 250

## 2023-01-02 MED ORDER — ONDANSETRON HCL 4 MG/2ML IJ SOLN
4.0000 mg | Freq: Four times a day (QID) | INTRAMUSCULAR | Status: DC | PRN
  Administered 2023-01-02 – 2023-01-03 (×2): 4 mg via INTRAVENOUS
  Filled 2023-01-02 (×2): qty 2

## 2023-01-02 MED ORDER — CHLORHEXIDINE GLUCONATE 0.12 % MT SOLN
15.0000 mL | Freq: Once | OROMUCOSAL | Status: AC
  Administered 2023-01-02: 15 mL via OROMUCOSAL
  Filled 2023-01-02: qty 15

## 2023-01-02 MED ORDER — POTASSIUM CHLORIDE CRYS ER 20 MEQ PO TBCR: 20.0000 meq | EXTENDED_RELEASE_TABLET | Freq: Every day | ORAL | Status: DC | PRN

## 2023-01-02 MED ORDER — LEVOTHYROXINE SODIUM 50 MCG PO TABS
75.0000 ug | ORAL_TABLET | Freq: Every day | ORAL | Status: DC
  Administered 2023-01-03: 75 ug via ORAL
  Filled 2023-01-02 (×2): qty 2

## 2023-01-02 MED ORDER — SODIUM CHLORIDE 0.9 % IV SOLN: 500.0000 mL | Freq: Once | INTRAVENOUS | Status: DC | PRN

## 2023-01-02 MED ORDER — HYDROMORPHONE HCL 1 MG/ML IJ SOLN: 1.0000 mg | Freq: Once | INTRAMUSCULAR | Status: DC | PRN

## 2023-01-02 MED ORDER — PROMETHAZINE HCL 25 MG/ML IJ SOLN: 6.2500 mg | INTRAMUSCULAR | Status: DC | PRN

## 2023-01-02 MED ORDER — PANCRELIPASE (LIP-PROT-AMYL) 36000-114000 UNITS PO CPEP: 72000.0000 [IU] | ORAL_CAPSULE | Freq: Two times a day (BID) | ORAL | Status: DC | PRN

## 2023-01-02 MED ORDER — ACETAMINOPHEN 10 MG/ML IV SOLN: 1000.0000 mg | Freq: Once | INTRAVENOUS | Status: DC | PRN

## 2023-01-02 SURGICAL SUPPLY — 36 items
CATH ACCU-VU SIZ PIG 5F 70CM (CATHETERS) IMPLANT
CATH BALLN CODA 9X100X32 (BALLOONS) IMPLANT
CATH BEACON 5 .035 65 KMP TIP (CATHETERS) IMPLANT
CATH COBRA TEMPO C2 5X65 (CATHETERS) IMPLANT
CATH MICROCATH PRGRT 2.8F 110 (CATHETERS) IMPLANT
CATH VS15FR (CATHETERS) IMPLANT
CLOSURE PERCLOSE PROSTYLE (VASCULAR PRODUCTS) IMPLANT
COIL 400 COMPLEX STD 4X15CM (Vascular Products) IMPLANT
COIL 400 COMPLEX STD 4X35CM (Vascular Products) IMPLANT
COVER DRAPE FLUORO 36X44 (DRAPES) IMPLANT
COVER EZ STRL 42X30 (DRAPES) IMPLANT
COVER PROBE ULTRASOUND 5X96 (MISCELLANEOUS) IMPLANT
DEVICE OCCLUSION PODJ15 (Vascular Products) IMPLANT
DEVICE SAFEGUARD 24CM (GAUZE/BANDAGES/DRESSINGS) IMPLANT
DEVICE TORQUE (MISCELLANEOUS) IMPLANT
EXCLDR TRNK ENDO 26X14.5X12 16 (Endovascular Graft) ×1 IMPLANT
EXCLUDER TNK END 26X14.5X12 16 (Endovascular Graft) IMPLANT
GLIDEWIRE STIFF .35X180X3 HYDR (WIRE) IMPLANT
GUIDEWIRE ADV .018X180CM (WIRE) IMPLANT
HANDLE DETACHMENT COIL (MISCELLANEOUS) IMPLANT
LEG CONTRALATERAL 16X20X13.5 (Vascular Products) ×1 IMPLANT
LEG CONTRALATERAL 18X11.5 (Endovascular Graft) IMPLANT
MICROCATH PROGREAT 2.8F 110 CM (CATHETERS) ×1
NDL ENTRY 21GA 7CM ECHOTIP (NEEDLE) IMPLANT
NEEDLE ENTRY 21GA 7CM ECHOTIP (NEEDLE) ×1
OCCLUSION DEVICE PODJ15 (Vascular Products) ×1 IMPLANT
PACK ANGIOGRAPHY (CUSTOM PROCEDURE TRAY) ×1 IMPLANT
SET INTRO CAPELLA COAXIAL (SET/KITS/TRAYS/PACK) IMPLANT
SHEATH BRITE TIP 6FRX11 (SHEATH) IMPLANT
SHEATH BRITE TIP 8FRX11 (SHEATH) IMPLANT
SHEATH DRYSEAL FLEX 12FR 33CM (SHEATH) IMPLANT
SHEATH DRYSEAL FLEX 16FR 33CM (SHEATH) IMPLANT
STENT GRAFT CONTRALAT 20X13.5 (Vascular Products) IMPLANT
TUBING CONTRAST HIGH PRESS 72 (TUBING) IMPLANT
WIRE AMPLATZ SSTIFF .035X260CM (WIRE) IMPLANT
WIRE GUIDERIGHT .035X150 (WIRE) IMPLANT

## 2023-01-02 NOTE — Progress Notes (Signed)
POST Procedure note:  Patient resting comfortably in bed. Endorses 5/10 bilateral groin pain. Palpable pulses bilaterally to lower extremities. Patient given Morphine for pain. He then became nauseated. He was then given Zofran. Patient endorsed 10 minutes later nausea and pain were much better. Vitals all remain stable.

## 2023-01-02 NOTE — Anesthesia Postprocedure Evaluation (Signed)
Anesthesia Post Note  Patient: Damon Torres  Procedure(s) Performed: ENDOVASCULAR REPAIR/STENT GRAFT  Patient location during evaluation: Specials Recovery Anesthesia Type: General Level of consciousness: awake and alert Pain management: pain level controlled Vital Signs Assessment: post-procedure vital signs reviewed and stable Respiratory status: spontaneous breathing, nonlabored ventilation, respiratory function stable and patient connected to nasal cannula oxygen Cardiovascular status: blood pressure returned to baseline and stable Postop Assessment: no apparent nausea or vomiting Anesthetic complications: no  There were no known notable events for this encounter.   Last Vitals:  Vitals:   01/02/23 1110 01/02/23 1115  BP:  (!) 167/76  Pulse: 98 (!) 40  Resp: 11 15  Temp:    SpO2: 97% 97%    Last Pain:  Vitals:   01/02/23 1105  TempSrc:   PainSc: 5                  Stephanie Coup

## 2023-01-02 NOTE — Op Note (Signed)
OPERATIVE NOTE   PROCEDURE: US guidance for vascular access, bilateral femoral arteries Catheter placement into aorta from bilateral femoral approaches Placement of a 26 x 14 x 12 C3 conformable Gore Excluder Endoprosthesis main body and a 18 x 12 ipsilateral extension limb with a 20 x 14 contralateral limb Catheter placement into left lower pole accessory renal artery from right femoral approach with selective renal angiogram.   Coil embolization of left lower pole accessory renal artery using Ruby coils.   ProGlide closure devices bilateral femoral arteries  PRE-OPERATIVE DIAGNOSIS: AAA  POST-OPERATIVE DIAGNOSIS: same  SURGEON: Levora Dredge, MD and Festus Barren, MD - Co-surgeons  ANESTHESIA: general  ESTIMATED BLOOD LOSS: 50 cc  FINDING(S): 1.  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 y.o. male who presents with an abdominal aortic aneurysm that is greater than 5 cm placing him at risk for lethal rupture.  The anatomy is suitable for endovascular repair.  Risks and benefits for repair of the abdominal aortic aneurysm using an endograft was described in detail to the patient all questions have been answered patient agrees to proceed.  Co-surgeons are utilized to expedite the procedure and reduce operative time improving patient's safety and improving outcome.  DESCRIPTION: After obtaining full informed written consent, the patient was brought back to the operating room and placed supine upon the operating table.  The patient received IV antibiotics prior to induction.  After obtaining adequate anesthesia, the patient was prepped and draped in the standard fashion for endovascular AAA repair.  Co-surgeons are required because this is a complex bilateral procedure with work being performed simultaneously from both the right femoral and left femoral approach.   This also expedites the procedure making a shorter operative time reducing complications and improving patient safety.  We then began by gaining access to both femoral arteries with US guidance with me working on the patient's left and Dr. Wyn Quaker working on the patient's right.  The femoral arteries were found to be patent and accessed without difficulty with a needle under ultrasound guidance without difficulty on each side and permanent images were recorded.  We then placed 2 proglide devices on each side in a pre-close fashion and placed 8 French sheaths.  The patient was then given 6000 units of intravenous heparin.   The Pigtail catheter was placed into the aorta from the left side. Using this image, we identified the inferior accessory left renal artery which was noted on the CT scan.  Using a combination of catheters and wires Dr. Wyn Quaker was able to select this artery.  Hand-injection of contrast through the Kumpe demonstrated the anatomy of the accessory renal artery verifying it was the lower pole and allowing for accurate measurement.  Given that it originated off the proximal portion of the aneurysm embolization is indicated to prevent persistent blood flow and a type II endoleak.  A prograde catheter was then advanced out into the midportion of this renal artery and 3 Ruby coils were used to embolize this branch.  Initially a 4 mm x 15 cm standard followed by a 4 mm x 35 cm standard and then finally a 15 cm packing coil was used.  Follow-up imaging demonstrated an excellent result with essentially no contrast filling the inferior pole of the renal from this injection and we elected to move forward with the case.  We reimaged the aorta  at the level of the proximal renal arteries and selected a 26 x 14 x 12 C3 conformable Main body device.  Over a stiff wire, an 14 French sheath was placed. The main body was then placed through the 16 French sheath. A Kumpe catheter was placed up the left side and a  magnified image at the renal arteries was performed. The main body was then deployed just below the lowest renal artery. The Kumpe catheter was used to cannulate the contralateral gate without difficulty and successful cannulation was confirmed by twirling the pigtail catheter in the main body. We then placed a stiff wire and a retrograde arteriogram was performed through the left femoral sheath. We upsized to the 12 [place] sheath for the contralateral limb and a 20 x 14 limb was selected and deployed. The main body deployment was then completed. Based off the angiographic findings, extension limbs were necessary on the right.  RAO retrograde injection was performed and an 18 x 12 extension limb for the right iliac was selected it was then advanced up the right sheath and deployed overlapping the ipsilateral limb. All junction points and seals zones were treated with the compliant balloon.   The pigtail catheter was then replaced and a completion angiogram was performed.   No endoleak was detected on completion angiography. The renal arteries were found to be widely patent.  Bilateral internal iliacs were preserved.   At this point we elected to terminate the procedure. We secured the pro glide devices for hemostasis on the femoral arteries. The skin incision was closed with a 4-0 Monocryl. Dermabond and pressure dressing were placed. The patient was taken to the recovery room in stable condition having tolerated the procedure well.  COMPLICATIONS: none  CONDITION: stable  Levora Dredge  01/02/2023, 10:26 AM

## 2023-01-02 NOTE — H&P (Signed)
[facility] Admission History & Physical  MRN : [id number]  Damon Torres is a 82 y.o. (Oct 05, 1940) male who presents with chief complaint of No chief complaint on file. [name]  History of Present Illness: Patient presents for repair of his abdominal aortic aneurysm.  Greater than 5 cm and at risk for lethal rupture.  No major changes since his clinic visit earlier this year.  No new complaints today.  Current Facility-Administered Medications  Medication Dose Route Frequency Provider Last Rate Last Admin   ceFAZolin (ANCEF) IVPB 2g/100 mL premix  2 g Intravenous On Call to OR Georgiana Spinner, NP 200 mL/hr at 01/02/23 0801 2 g at 01/02/23 0801   Chlorhexidine Gluconate Cloth 2 % PADS 6 each  6 each Topical Once Georgiana Spinner, NP       HYDROmorphone (DILAUDID) injection 1 mg  1 mg Intravenous Once PRN Georgiana Spinner, NP       lactated ringers infusion   Intravenous Continuous Stephanie Coup, MD 10 mL/hr at 01/02/23 0749 New Bag at 01/02/23 0749   ondansetron (ZOFRAN) injection 4 mg  4 mg Intravenous Q6H PRN Georgiana Spinner, NP        Past Medical History:  Diagnosis Date   Acquired thrombophilia (HCC) 05/19/2020   Aortic atherosclerosis (HCC)    Atrial fibrillation (HCC) 04/04/2013   a.) CHA2DS2VASc = 5 (age x2, CHF, HTN, vascular disease history);  b.) rate/rhythm maintained intrinsically without pharmacological intervention; chronically anticoagulated with rivaroxaban   Atrioventricular node dysfunction 01/08/2020   Bilateral elbow joint pain 12/16/2020   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   Chronic venous insufficiency 05/24/2021   Erectile dysfunction due to arterial insufficiency 05/06/2019   Gastric nodule 03/28/2016   Hallux valgus, left 02/26/2012   HFrEF (heart failure with reduced ejection fraction) (HCC) 10/12/2022   a.) TTE 10/12/2022: EF 40-45%, glob HK, mild-mod LV dil, mod LAE, sev RAE, mod TR/MR, mild-mod AR, PASP  52.9   Hyperlipidemia, mixed 01/06/2019   Hypertension 07/01/2012   Infrarenal abdominal aortic aneurysm (AAA) without rupture (HCC) 07/11/2014   a.) CT AP 07/11/2014: 3.8 cm; b.) MRI abd 05/31/2015: 4.0cm; c.) AAA duplex 03/17/2019: 3.7cm; d.) AAA duplex 03/18/2020: 4.2cm; e.) AAA duplex 03/17/2021: 4.4cm; f.) AAA duplex 03/20/2022: 4.6cm; g.) 09/18/2022: 5.0cm; h.) CTA AP 09/26/2022: 5.3 x 5.1cm   Long term current use of anticoagulant    a.) rivaroxaban   Long term current use of immunosuppressive drug    a.) adalimumab for RA   Long-term corticosteroid use    a.) prednisione for RA   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   Macrocytosis 09/29/2015   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   Pancreatic insufficiency 09/29/2015   a.) on lipase/protease/amylase with meals (4 capsules TID) and with snacks (2 capsules)   Pelvic floor dysfunction 09/29/2015   Penile lesion 05/06/2019   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   Pulmonary hypertension (HCC) 10/12/2022   a.) TTE 10/12/2022: RVSP 52.9 mmHg   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; Tx'd at [facility] with systemic antineoplastic chemotherapy + XRT   Rheumatoid arthritis involving multiple joints (HCC) 08/29/2011   a.) Tx'd with adalimumab + prednisone   Right lower lobe pulmonary nodule 11/20/2022   a.) cCTA 11/20/2022: indeterminant 4 mm   Stage 3 chronic kidney disease (HCC) 02/12/2019   Subclinical hypothyroidism 06/04/2012   Tachycardia-bradycardia syndrome (HCC)    Venous stasis ulcer (HCC) 08/27/2022    Past Surgical History:  Procedure Laterality Date   CARDIOVASCULAR STRESS TEST  10/19/2022   CATARACT EXTRACTION W/PHACO Right 02/14/2016   Procedure: CATARACT EXTRACTION PHACO AND INTRAOCULAR LENS PLACEMENT (IOC);  Surgeon: Galen Manila, MD;  Location: [facility] ORS;  Service: Ophthalmology;  Laterality: Right;  [place] 01:02 AP% 18.7 CDE 11.59 Fluid pack lot # [id number]   CATARACT EXTRACTION W/PHACO  Left 02/07/2021   Procedure: CATARACT EXTRACTION PHACO AND INTRAOCULAR LENS PLACEMENT (IOC) LEFT;  Surgeon: Galen Manila, MD;  Location: [facility];  Service: Ophthalmology;  Laterality: Left;  9.78 0:49.8   COLONOSCOPY     COLONOSCOPY WITH PROPOFOL N/A 01/27/2015   Procedure: COLONOSCOPY WITH PROPOFOL;  Surgeon: Elnita Maxwell, MD;  Location: [facility] END[facility]COPY;  Service: Endoscopy;  Laterality: N/A;   DOPPLER ECHOCARDIOGRAPHY Bilateral 10/12/2022   Right foot surgery Right 07/05/2012   toes pinned   TRANSANAL RECTAL RESECTION  06/04/1998   [facility]   VARICOSE VEIN SURGERY  06/04/1977     Social History   Tobacco Use   Smoking status: Former    Passive exposure: Past   Smokeless tobacco: Never  Substance Use Topics   Alcohol use: No   Drug use: No     Family History  Problem Relation Age of Onset   Liver cancer Mother    Breast cancer Mother    Heart disease Mother    Heart disease Brother        CABG and valve repair   Hypertension Brother    Colon cancer Neg Hx    Esophageal cancer Neg Hx    Rectal cancer Neg Hx    Stomach cancer Neg Hx    Diabetes Neg Hx     No Known Allergies  REVIEW OF SYSTEMS (Negative unless checked)   Constitutional: [] Weight loss  [] Fever  [] Chills Cardiac: [] Chest pain   [] Chest pressure   [x] Palpitations   [] Shortness of breath when laying flat   [] Shortness of breath at rest   [] Shortness of breath with exertion. Vascular:  [] Pain in legs with walking   [] Pain in legs at rest   [] Pain in legs when laying flat   [] Claudication   [] Pain in feet when walking  [] Pain in feet at rest  [] Pain in feet when laying flat   [] History of DVT   [] Phlebitis   [x] Swelling in legs   [] Varicose veins   [] Non-healing ulcers Pulmonary:   [] Uses home oxygen   [] Productive cough   [] Hemoptysis   [] Wheeze  [] COPD   [] Asthma Neurologic:  [] Dizziness  [] Blackouts   [] Seizures   [] History of stroke   [] History of TIA  [] Aphasia   [] Temporary blindness    [] Dysphagia   [] Weakness or numbness in arms   [] Weakness or numbness in legs Musculoskeletal:  [x] Arthritis   [] Joint swelling   [x] Joint pain   [] Low back pain Hematologic:  [] Easy bruising  [] Easy bleeding   [] Hypercoagulable state   [] Anemic   Gastrointestinal:  [] Blood in stool   [] Vomiting blood  [] Gastroesophageal reflux/heartburn   [] Abdominal pain Genitourinary:  [] Chronic kidney disease   [] Difficult urination  [] Frequent urination  [] Burning with urination   [] Hematuria Skin:  [] Rashes   [] Ulcers   [] Wounds Psychological:  [] History of anxiety   []  History of major depression.   Physical Examination  Vitals:   01/02/23 0755  BP: (!) 160/78  Pulse: (!) 47  Resp: 13  Temp: 97.6 F (36.4 C)  TempSrc: Oral  SpO2: 96%   There is no height or weight on  file to calculate BMI. Gen: WD/WN, NAD Head: [place]/AT, No temporalis wasting.  Ear/Nose/Throat: Hearing grossly intact, nares w/o erythema or drainage, oropharynx w/o Erythema/Exudate,  Eyes: Conjunctiva clear, sclera non-icteric Neck: Trachea midline.  No JVD.  Pulmonary:  Good air movement, respirations not labored, no use of accessory muscles.  Cardiac: RRR, normal S1, S2. Vascular:  Vessel Right Left  Radial Palpable Palpable                                   Gastrointestinal: soft, non-tender/non-distended. No guarding/reflex.  Musculoskeletal: M/S 5/5 throughout.  Extremities without ischemic changes.  No deformity or atrophy.  Neurologic: Sensation grossly intact in extremities.  Symmetrical.  Speech is fluent. Motor exam as listed above. Psychiatric: Judgment intact, Mood & affect appropriate for pt's clinical situation. Dermatologic: No rashes or ulcers noted.  No cellulitis or open wounds.      CBC Lab Results  Component Value Date   WBC 8.2 12/24/2022   HGB 11.7 (L) 12/24/2022   HCT 34.0 (L) 12/24/2022   MCV 103.0 (H) 12/24/2022   PLT 152 12/24/2022    BMET    Component Value Date/Time   NA  135 12/24/2022 1200   K 5.1 12/24/2022 1200   CL 104 12/24/2022 1200   CO2 25 12/24/2022 1200   GLUCOSE 129 (H) 12/24/2022 1200   BUN 46 (H) 12/24/2022 1200   CREATININE 1.65 (H) 12/24/2022 1200   CALCIUM 8.6 (L) 12/24/2022 1200   GFRNONAA 41 (L) 12/24/2022 1200   Estimated Creatinine Clearance: 38.5 mL/min (A) (by C-G formula based on SCr of 1.65 mg/dL (H)).  COAG No results found for: "INR", "PROTIME"  Radiology No results found.   Assessment/Plan Hyperlipidemia, mixed lipid control important in reducing the progression of atherosclerotic disease. Continue statin therapy     Essential hypertension blood pressure control important in reducing the progression of atherosclerotic disease. On appropriate oral medications.   Peripheral vascular obstructive disease (HCC) ABIs were normal with good digit pressures and waveforms.  No role for intervention.  Can be checked every few years.   AAA (abdominal aortic aneurysm) [facility]) Patient has adequate anatomy for endovascular repair.  Repair planned for today.  Risks and benefits are discussed.   Festus Barren, MD  01/02/2023 8:02 AM

## 2023-01-02 NOTE — Op Note (Signed)
OPERATIVE NOTE   PROCEDURE: US guidance for vascular access, bilateral femoral arteries Catheter placement into aorta from bilateral femoral approaches Catheter placement into left lower pole accessory renal artery from right femoral approach with selective renal angiogram Coil embolization of left lower pole accessory renal artery with a total of 3 Ruby coils Placement of a 26 mm proximal conformable C3 Gore Excluder Endoprosthesis main body right with a 20 mm diameter by 14 cm length left iliac contralateral limb Placement of 18 mm diameter by 12 cm length right iliac extension limb ProGlide closure devices bilateral femoral arteries  PRE-OPERATIVE DIAGNOSIS: AAA  POST-OPERATIVE DIAGNOSIS: same  SURGEON: Festus Barren, MD and Levora Dredge, MD - Co-surgeons  ANESTHESIA: General  ESTIMATED BLOOD LOSS: 50 cc  FINDING(S): 1.  AAA  SPECIMEN(S):  none  INDICATIONS:   Damon Torres is a 82 y.o. male who presents with a greater than 5 cm abdominal aortic aneurysm putting him at risk of lethal rupture. The anatomy was suitable for endovascular repair.  Risks and benefits of repair in an endovascular fashion were discussed and informed consent was obtained. Co-surgeons are used to expedite the procedure and reduce operative time as bilateral work needs to be done.  DESCRIPTION: After obtaining full informed written consent, the patient was brought back to the operating room and placed supine upon the operating table.  The patient received IV antibiotics prior to induction.  After obtaining adequate anesthesia, the patient was prepped and draped in the standard fashion for endovascular AAA repair.  We then began by gaining access to both femoral arteries with US guidance with me working on the right and Dr. Gilda Crease working on the left.  The femoral arteries were found to be patent and accessed without difficulty with a needle under ultrasound guidance without difficulty on each side and  permanent images were recorded.  We then placed 2 proglide devices on each side in a pre-close fashion and placed 8 French sheaths. The patient was then given 6000 units of intravenous heparin. The Pigtail catheter was placed into the aorta from the left side.  He had multiple renal arteries bilaterally.  There were 2 main renal arteries on the right that were codominant.  There were 3 renal arteries on the left with the highest to being in typical locations with the stent graft to be placed below the middle of the left 3 renal arteries.  There was a left lower pole accessory renal artery was sizable and came off at the beginning of the aneurysm which would be at risk of an endoleak after stent graft placement and required coil embolization.  This was somewhat difficult to cannulate and a variety of catheters were tried including a V S1, C2, and ultimately a Kumpe catheter was 0.018 advantage wire was used to cannulate this left lower pole renal artery from the right femoral approach.  I was able to advance the Kumpe catheter out and perform selective imaging which showed this clearly was the left lower pole renal artery and was of size and location that would put at risk of creating an endoleak.  I then used a prograde microcatheter and got out to the distal left lower pole renal artery and began coil embolization.  I started with a 4 mm diameter by 15 cm length Ruby coil and then used a 4 mm diameter by 35 cm length Ruby coil.  Finally, a 15 cm packing coil was placed.  Completion imaging showed successful embolization with occlusion  of this vessel and we turned our attention back to the aneurysm repair proper.  We selected a 26 mm diameter conformable C3 Gore excluder Main body device.  Over a stiff wire, an 16 French sheath was placed up the right side. The main body was then placed through the 16 French sheath. A Kumpe catheter was placed up the left side and a magnified image at the renal arteries was  performed. The main body was then deployed just below the lowest renal artery. The Kumpe catheter was used to cannulate the contralateral gate without difficulty and successful cannulation was confirmed by twirling the pigtail catheter in the main body. We then placed a stiff wire and a retrograde arteriogram was performed through the left femoral sheath. We upsized to the 12 [place] sheath on the left side for the contralateral limb and a 20 mm diameter by 14 cm length left iliac limb was selected and deployed. The main body deployment was then completed. Based off the angiographic findings, extension limbs were necessary.  An 18 mm diameter by 12 cm length right iliac extension limb was taken down to just above the right hypogastric artery. All junction points and seals zones were treated with the compliant balloon. The pigtail catheter was then replaced and a completion angiogram was performed.  The 2 right renal arteries and the 2 superior left renal arteries were widely patent.  The lower pole left renal artery was successfully embolized.  Both hypogastric arteries remain patent.  No endoleak was detected on completion angiography. At this point we elected to terminate the procedure. We secured the pro glide devices for hemostasis on the femoral arteries. The skin incision was closed with a 4-0 Monocryl. Dermabond and pressure dressing were placed. The patient was taken to the recovery room in stable condition having tolerated the procedure well.  COMPLICATIONS: none  CONDITION: stable  Festus Barren  01/02/2023, 10:31 AM   This note was created with Dragon Medical transcription system. Any errors in dictation are purely unintentional.

## 2023-01-02 NOTE — Plan of Care (Signed)
  Problem: Coping: Goal: Ability to adjust to condition or change in health will improve Outcome: Progressing   

## 2023-01-02 NOTE — Progress Notes (Signed)
Skin Tear noted on left hand, arrived with gauze taped over it.  Gauze removed and tegaderm applied.

## 2023-01-02 NOTE — Anesthesia Procedure Notes (Signed)
Procedure Name: Intubation Date/Time: 01/02/2023 8:38 AM  Performed by: Morene Crocker, CRNAPre-anesthesia Checklist: Patient identified, Patient being monitored, Timeout performed, Emergency Drugs available and Suction available Patient Re-evaluated:Patient Re-evaluated prior to induction Oxygen Delivery Method: Circle system utilized Preoxygenation: Pre-oxygenation with 100% oxygen Induction Type: IV induction Ventilation: Mask ventilation without difficulty Laryngoscope Size: 3 and McGraph Grade View: Grade I Tube type: Oral Tube size: 7.0 mm Number of attempts: 1 Airway Equipment and Method: Stylet Placement Confirmation: ETT inserted through vocal cords under direct vision, positive ETCO2 and breath sounds checked- equal and bilateral Secured at: 22 cm Tube secured with: Tape Dental Injury: Teeth and Oropharynx as per pre-operative assessment  Comments: Smooth atraumatic intubation, no complications noted.

## 2023-01-02 NOTE — Anesthesia Preprocedure Evaluation (Signed)
Anesthesia Evaluation  Patient identified by MRN, date of birth, ID band Patient awake    Reviewed: Allergy & Precautions, H&P , NPO status , Patient's Chart, lab work & pertinent test results, reviewed documented beta blocker date and time   Airway Mallampati: II  TM Distance: >3 FB Neck ROM: full    Dental  (+) Teeth Intact   Pulmonary neg pulmonary ROS, former smoker   Pulmonary exam normal        Cardiovascular Exercise Tolerance: Poor hypertension, On Medications + Peripheral Vascular Disease  Normal cardiovascular exam+ dysrhythmias  Rhythm:regular Rate:Normal     Neuro/Psych  Neuromuscular disease  negative psych ROS   GI/Hepatic negative GI ROS, Neg liver ROS,,,  Endo/Other  Hypothyroidism    Renal/GU Renal disease  negative genitourinary   Musculoskeletal   Abdominal   Peds  Hematology negative hematology ROS (+)   Anesthesia Other Findings Past Medical History: 05/19/2020: Acquired thrombophilia (HCC) No date: Aortic atherosclerosis (HCC) 04/04/2013: Atrial fibrillation (HCC)     Comment:  a.) CHA2DS2VASc = 5 (age x2, CHF, HTN, vascular disease               history);  b.) rate/rhythm maintained intrinsically               without pharmacological intervention; chronically               anticoagulated with rivaroxaban 01/08/2020: Atrioventricular node dysfunction 12/16/2020: Bilateral elbow joint pain 02/12/2019: BPH with obstruction/lower urinary tract symptoms 05/24/2021: Chronic venous insufficiency 05/06/2019: Erectile dysfunction due to arterial insufficiency 03/28/2016: Gastric nodule 02/26/2012: Hallux valgus, left 10/12/2022: HFrEF (heart failure with reduced ejection fraction) (HCC)     Comment:  a.) TTE 10/12/2022: EF 40-45%, glob HK, mild-mod LV dil,              mod LAE, sev RAE, mod TR/MR, mild-mod AR, PASP 52.9 01/06/2019: Hyperlipidemia, mixed 07/01/2012: Hypertension 07/11/2014:  Infrarenal abdominal aortic aneurysm (AAA) without  rupture (HCC)     Comment:  a.) CT AP 07/11/2014: 3.8 cm; b.) MRI abd 05/31/2015:               4.0cm; c.) AAA duplex 03/17/2019: 3.7cm; d.) AAA duplex               03/18/2020: 4.2cm; e.) AAA duplex 03/17/2021: 4.4cm; f.)               AAA duplex 03/20/2022: 4.6cm; g.) 09/18/2022: 5.0cm; h.)               CTA AP 09/26/2022: 5.3 x 5.1cm No date: Long term current use of anticoagulant     Comment:  a.) rivaroxaban No date: Long term current use of immunosuppressive drug     Comment:  a.) adalimumab for RA No date: Long-term corticosteroid use     Comment:  a.) prednisione for RA 04/01/2016: Lymphocytic colitis 09/29/2015: Macrocytosis 08/30/2014: Osteoarthritis of left midfoot 09/29/2015: Pancreatic insufficiency     Comment:  a.) on lipase/protease/amylase with meals (4 capsules               TID) and with snacks (2 capsules) 09/29/2015: Pelvic floor dysfunction 05/06/2019: Penile lesion 01/22/2019: Peripheral vascular obstructive disease (HCC) 10/12/2022: Pulmonary hypertension (HCC)     Comment:  a.) TTE 10/12/2022: RVSP 52.9 mmHg 06/04/1998: 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; Tx'd at [facility] with systemic antineoplastic  chemotherapy + XRT 08/29/2011: Rheumatoid arthritis involving multiple joints (HCC)     Comment:  a.) Tx'd with adalimumab + prednisone 11/20/2022: Right lower lobe pulmonary nodule     Comment:  a.) cCTA 11/20/2022: indeterminant 4 mm 02/12/2019: Stage 3 chronic kidney disease (HCC) 06/04/2012: Subclinical hypothyroidism No date: Tachycardia-bradycardia syndrome (HCC) 08/27/2022: Venous stasis ulcer (HCC) Past Surgical History: 10/19/2022: CARDIOVASCULAR STRESS TEST 02/14/2016: CATARACT EXTRACTION W/PHACO; Right     Comment:  Procedure: CATARACT EXTRACTION PHACO AND INTRAOCULAR               LENS PLACEMENT (IOC);  Surgeon: Galen Manila, MD;                Location: [facility] ORS;   Service: Ophthalmology;  Laterality:              Right;  [place] 01:02 AP% 18.7 CDE 11.59 Fluid pack lot #               [id number] 02/07/2021: CATARACT EXTRACTION W/PHACO; Left     Comment:  Procedure: CATARACT EXTRACTION PHACO AND INTRAOCULAR               LENS PLACEMENT (IOC) LEFT;  Surgeon: Galen Manila,               MD;  Location: [facility];  Service:               Ophthalmology;  Laterality: Left;  9.78 0:49.8 No date: COLONOSCOPY 01/27/2015: COLONOSCOPY WITH PROPOFOL; N/A     Comment:  Procedure: COLONOSCOPY WITH PROPOFOL;  Surgeon: Elnita Maxwell, MD;  Location: [facility] END[facility];  Service:               Endoscopy;  Laterality: N/A; 10/12/2022: DOPPLER ECHOCARDIOGRAPHY; Bilateral 07/05/2012: Right foot surgery; Right     Comment:  toes pinned 06/04/1998: TRANSANAL RECTAL RESECTION     Comment:  [place]ke 06/04/1977: VARICOSE VEIN SURGERY   Reproductive/Obstetrics negative OB ROS                             Anesthesia Physical Anesthesia Plan  ASA: 4  Anesthesia Plan: General ETT   Post-op Pain Management:    Induction:   PONV Risk Score and Plan: 3  Airway Management Planned:   Additional Equipment:   Intra-op Plan:   Post-operative Plan:   Informed Consent: I have reviewed the patients History and Physical, chart, labs and discussed the procedure including the risks, benefits and alternatives for the proposed anesthesia with the patient or authorized representative who has indicated his/her understanding and acceptance.     Dental Advisory Given  Plan Discussed with: CRNA  Anesthesia Plan Comments:        Anesthesia Quick Evaluation

## 2023-01-02 NOTE — Transfer of Care (Signed)
Immediate Anesthesia Transfer of Care Note  Patient: Damon Torres  Procedure(s) Performed: ENDOVASCULAR REPAIR/STENT GRAFT  Patient Location: PACU  Anesthesia Type:General  Level of Consciousness: awake and drowsy  Airway & Oxygen Therapy: Patient Spontanous Breathing and Patient connected to face mask oxygen  Post-op Assessment: Report given to RN and Post -op Vital signs reviewed and stable  Post vital signs: Reviewed and stable  Last Vitals:  Vitals Value Taken Time  BP 182/70 01/02/23 1045  Temp 35.9   Pulse 94 01/02/23 1051  Resp 17 01/02/23 1051  SpO2 100 % 01/02/23 1051  Vitals shown include unfiled device data.  Last Pain:  Vitals:   01/02/23 0755  TempSrc: Oral     Transported to PACU on transport monitors and oxygen. No anesthetic complications noted. Reported to PACU RN.     Complications: There were no known notable events for this encounter.

## 2023-01-03 ENCOUNTER — Encounter: Payer: Self-pay | Admitting: Vascular Surgery

## 2023-01-03 LAB — GLUCOSE, CAPILLARY
Glucose-Capillary: 122 mg/dL — ABNORMAL HIGH (ref 70–99)
Glucose-Capillary: 96 mg/dL (ref 70–99)
Glucose-Capillary: 109 mg/dL — ABNORMAL HIGH (ref 70–99)
Glucose-Capillary: 130 mg/dL — ABNORMAL HIGH (ref 70–99)

## 2023-01-03 MED ORDER — FUROSEMIDE 10 MG/ML IJ SOLN
10.0000 mg | Freq: Once | INTRAMUSCULAR | Status: AC
  Administered 2023-01-03: 10 mg via INTRAVENOUS
  Filled 2023-01-03: qty 2

## 2023-01-03 MED ORDER — CHLORHEXIDINE GLUCONATE CLOTH 2 % EX PADS
6.0000 | MEDICATED_PAD | Freq: Every day | CUTANEOUS | Status: DC
  Administered 2023-01-03: 6 via TOPICAL

## 2023-01-03 MED ORDER — RIVAROXABAN 15 MG PO TABS
15.0000 mg | ORAL_TABLET | Freq: Every day | ORAL | Status: DC
  Administered 2023-01-03: 15 mg via ORAL
  Filled 2023-01-03: qty 1

## 2023-01-03 MED ORDER — HYDRALAZINE HCL 20 MG/ML IJ SOLN
10.0000 mg | INTRAMUSCULAR | Status: DC | PRN
  Administered 2023-01-03: 10 mg via INTRAVENOUS
  Filled 2023-01-03: qty 1

## 2023-01-03 MED ORDER — TAMSULOSIN HCL 0.4 MG PO CAPS
0.4000 mg | ORAL_CAPSULE | Freq: Every day | ORAL | Status: DC
  Administered 2023-01-03: 0.4 mg via ORAL
  Filled 2023-01-03: qty 1

## 2023-01-03 NOTE — Progress Notes (Signed)
Per order, bilateral PAD's removed from pt's groin. Femoral sites closed, no bleeding, signs of infection or swelling noted. Band-aids applied to sites per order. Pt tolerated well.

## 2023-01-03 NOTE — Plan of Care (Signed)
  Problem: Respiratory: Goal: Ability to achieve and maintain a regular respiratory rate will improve Outcome: Progressing

## 2023-01-03 NOTE — Plan of Care (Signed)
Pt continues to ask appropriate questions about his current health status and  verbalizes looking forward to being discharged. Progressing well.

## 2023-01-03 NOTE — Progress Notes (Signed)
  Progress Note    01/03/2023 3:48 PM 1 Day Post-Op  Subjective:  Damon Torres is an 82 yo male now POD #1 from endovascular abdominal aortic aneurysm repair with coil embolization of the left lower pole of the renal artery. Patient resting comfortably this morning in the bedside chair. Patient denies any abdominal pain, groin pain or chest pain. Foley Catheter was just removed. Patient has ambulated around the halls without difficulties. Patient wishes to go home today. Patient will need to urinate on his own prior to discharge. Patient vitals overnight all remain stable. Patient has palpable pulses in bilateral lower extremities.    Vitals:   01/03/23 0930 01/03/23 1000  BP: (!) 150/65 (!) 158/71  Pulse: 67 64  Resp: 18 17  Temp:    SpO2: 98% 96%   Physical Exam: Cardiac:  RRR, Normal S1, S2. No murmurs  Lungs:  Clear on Auscultation throughout, No rale, rhonchi or wheezing.  Incisions:  Bilateral groins with dressings clean dry and intact. No hematoma to note.  Extremities:  Bilateral lower extremities warm to touch with palpable pulses.  Abdomen:  Positive bowel sounds throughout, soft non tender and non distended.  Neurologic: AAOX3 follows all commands and answers all questions appropriately.   CBC    Component Value Date/Time   WBC 10.6 (H) 01/03/2023 0424   RBC 3.15 (L) 01/03/2023 0424   HGB 11.0 (L) 01/03/2023 0424   HCT 33.3 (L) 01/03/2023 0424   PLT 138 (L) 01/03/2023 0424   MCV 105.7 (H) 01/03/2023 0424   MCH 34.9 (H) 01/03/2023 0424   MCHC 33.0 01/03/2023 0424   RDW 13.7 01/03/2023 0424   LYMPHSABS 2.8 12/24/2022 1200   MONOABS 1.1 (H) 12/24/2022 1200   EOSABS 0.2 12/24/2022 1200   BASOSABS 0.1 12/24/2022 1200    BMET    Component Value Date/Time   NA 131 (L) 01/03/2023 0424   K 5.4 (H) 01/03/2023 0424   CL 106 01/03/2023 0424   CO2 17 (L) 01/03/2023 0424   GLUCOSE 123 (H) 01/03/2023 0424   BUN 41 (H) 01/03/2023 0424   CREATININE 1.75 (H) 01/03/2023  0424   CALCIUM 8.5 (L) 01/03/2023 0424   GFRNONAA 38 (L) 01/03/2023 0424    INR    Component Value Date/Time   INR 1.3 (H) 01/02/2023 1147     Intake/Output Summary (Last 24 hours) at 01/03/2023 1548 Last data filed at 01/03/2023 1000 Gross per 24 hour  Intake 1684.36 ml  Output 750 ml  Net 934.36 ml     Assessment/Plan:  82 y.o. male is s/p endovascular abdominal aortic aneurysm repair with coil embolization of the left lower pole of the renal artery.  1 Day Post-Op   PLAN: Advance Diet as tolerated Pain medications PRN Ambulate in Halls Discontinue Foley  Possible discharge later today after urinating.   DVT prophylaxis:  Xarelto 15 mg    Marcie Bal [facility] 01/03/2023 3:48 PM

## 2023-01-04 LAB — GLUCOSE, CAPILLARY: Glucose-Capillary: 105 mg/dL — ABNORMAL HIGH (ref 70–99)

## 2023-01-04 MED ORDER — ASPIRIN 81 MG PO TBEC: 81.0000 mg | DELAYED_RELEASE_TABLET | Freq: Every day | ORAL | 12 refills | Status: DC

## 2023-01-04 MED ORDER — ASPIRIN 81 MG PO TBEC
81.0000 mg | DELAYED_RELEASE_TABLET | Freq: Every day | ORAL | Status: DC
  Administered 2023-01-04: 81 mg via ORAL

## 2023-01-04 MED ORDER — LEVOTHYROXINE SODIUM 50 MCG PO TABS
75.0000 ug | ORAL_TABLET | Freq: Every day | ORAL | Status: DC
  Administered 2023-01-04: 75 ug via ORAL

## 2023-01-04 NOTE — Discharge Summary (Signed)
[facility]    Discharge Summary    Patient ID:  Damon Torres MRN: [id number] DOB/AGE: 1940/08/10 82 y.o.  Admit date: 01/02/2023 Discharge date: 01/04/2023 Date of Surgery: 01/02/2023 Surgeon: Surgeon(s): Dew, Marlow Baars, MD  Admission Diagnosis: AAA (abdominal aortic aneurysm, ruptured) [facility]) [I71.30]  Discharge Diagnoses:  AAA (abdominal aortic aneurysm, ruptured) (HCC) [I71.30]  Secondary Diagnoses: Past Medical History:  Diagnosis Date   Acquired thrombophilia (HCC) 05/19/2020   Aortic atherosclerosis (HCC)    Atrial fibrillation (HCC) 04/04/2013   a.) CHA2DS2VASc = 5 (age x2, CHF, HTN, vascular disease history);  b.) rate/rhythm maintained intrinsically without pharmacological intervention; chronically anticoagulated with rivaroxaban   Atrioventricular node dysfunction 01/08/2020   Bilateral elbow joint pain 12/16/2020   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   Chronic venous insufficiency 05/24/2021   Erectile dysfunction due to arterial insufficiency 05/06/2019   Gastric nodule 03/28/2016   Hallux valgus, left 02/26/2012   HFrEF (heart failure with reduced ejection fraction) (HCC) 10/12/2022   a.) TTE 10/12/2022: EF 40-45%, glob HK, mild-mod LV dil, mod LAE, sev RAE, mod TR/MR, mild-mod AR, PASP 52.9   Hyperlipidemia, mixed 01/06/2019   Hypertension 07/01/2012   Infrarenal abdominal aortic aneurysm (AAA) without rupture (HCC) 07/11/2014   a.) CT AP 07/11/2014: 3.8 cm; b.) MRI abd 05/31/2015: 4.0cm; c.) AAA duplex 03/17/2019: 3.7cm; d.) AAA duplex 03/18/2020: 4.2cm; e.) AAA duplex 03/17/2021: 4.4cm; f.) AAA duplex 03/20/2022: 4.6cm; g.) 09/18/2022: 5.0cm; h.) CTA AP 09/26/2022: 5.3 x 5.1cm   Long term current use of anticoagulant    a.) rivaroxaban   Long term current use of immunosuppressive drug    a.) adalimumab for RA   Long-term corticosteroid use    a.) prednisione for RA   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    Macrocytosis 09/29/2015   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   Pancreatic insufficiency 09/29/2015   a.) on lipase/protease/amylase with meals (4 capsules TID) and with snacks (2 capsules)   Pelvic floor dysfunction 09/29/2015   Penile lesion 05/06/2019   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   Pulmonary hypertension (HCC) 10/12/2022   a.) TTE 10/12/2022: RVSP 52.9 mmHg   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; Tx'd at [facility] with systemic antineoplastic chemotherapy + XRT   Rheumatoid arthritis involving multiple joints (HCC) 08/29/2011   a.) Tx'd with adalimumab + prednisone   Right lower lobe pulmonary nodule 11/20/2022   a.) cCTA 11/20/2022: indeterminant 4 mm   Stage 3 chronic kidney disease (HCC) 02/12/2019   Subclinical hypothyroidism 06/04/2012   Tachycardia-bradycardia syndrome (HCC)    Venous stasis ulcer (HCC) 08/27/2022    Procedure(s): ENDOVASCULAR REPAIR/STENT GRAFT  Discharged Condition: good  HPI:  Damon Torres is an 82 year old male who is now status post endovascular abdominal aortic repair with coil embolization.  He is resting comfortably in bed this morning.  He has no complaints of pain.  Denies any chest pain, shortness of breath or dizziness.  Patient had some difficulty urinating yesterday but urinated 600 mL overnight.  Patient is recovering as expected.  Patient cleared for discharge today.  Patient to be discharged on aspirin 81 mg daily and Xarelto 15 mg twice a day and Crestor 10 mg daily.  No need for dual anticoagulation therapy due to Xarelto.  Hospital Course:  Damon Torres is a 82 y.o. male is S/P now POD #1 from endovascular abdominal aortic aneurysm repair with coil embolization of the left lower pole of the renal artery.  Extubated: POD # 0 Physical Exam:  Alert notes x3, no acute distress Face: Symmetrical.  Tongue is midline. Neck: Trachea is midline.  No swelling or bruising. Cardiovascular: Regular  rate and rhythm Pulmonary: Clear to auscultation bilaterally Abdomen: Soft, nontender, nondistended Right groin access: Clean dry and intact.  No swelling or drainage noted Left groin access: Clean dry and intact.  No swelling or drainage noted Left lower extremity: Thigh soft.  Calf soft.  Extremities warm distally toes.  Hard to palpate pedal pulses however the foot is warm is her good capillary refill. Right lower extremity: Thigh soft.  Calf soft.  Extremities warm distally toes.  Hard to palpate pedal pulses however the foot is warm is her good capillary refill. Neurological: No deficits noted   Post-op wounds:  clean, dry, intact or healing well  Pt. Ambulating, voiding and taking PO diet without difficulty. Pt pain controlled with PO pain meds.  Labs:  As below  Complications: none  Consults:    Significant Diagnostic Studies: CBC Lab Results  Component Value Date   WBC 10.6 (H) 01/03/2023   HGB 11.0 (L) 01/03/2023   HCT 33.3 (L) 01/03/2023   MCV 105.7 (H) 01/03/2023   PLT 138 (L) 01/03/2023    BMET    Component Value Date/Time   NA 131 (L) 01/03/2023 0424   K 5.4 (H) 01/03/2023 0424   CL 106 01/03/2023 0424   CO2 17 (L) 01/03/2023 0424   GLUCOSE 123 (H) 01/03/2023 0424   BUN 41 (H) 01/03/2023 0424   CREATININE 1.75 (H) 01/03/2023 0424   CALCIUM 8.5 (L) 01/03/2023 0424   GFRNONAA 38 (L) 01/03/2023 0424   COAG Lab Results  Component Value Date   INR 1.3 (H) 01/02/2023     Disposition:  Discharge to :Home  Allergies as of 01/04/2023   No Known Allergies      Medication List     TAKE these medications    aspirin EC 81 MG tablet Take 1 tablet (81 mg total) by mouth daily before breakfast. Swallow whole.   budesonide 3 MG 24 hr capsule Commonly known as: ENTOCORT EC Take 6 mg by mouth daily.   Entresto 24-26 MG Generic drug: sacubitril-valsartan Take 1 tablet by mouth 2 (two) times daily.   Farxiga 10 MG Tabs tablet Generic drug:  dapagliflozin propanediol Take 10 mg by mouth every morning.   furosemide 40 MG tablet Commonly known as: LASIX Take 1 tablet (40 mg total) by mouth daily. What changed:  how much to take when to take this   Humira (2 Pen) 40 MG/0.4ML pen Generic drug: adalimumab Inject 40 mg into the skin every 14 (fourteen) days.   levothyroxine 75 MCG tablet Commonly known as: SYNTHROID TAKE 1 TABLET DAILY BEFORE BREAKFAST **MFR MYLAN** What changed: See the new instructions.   lipase/protease/amylase [address] UNITS Cpep capsule Commonly known as: Creon TAKE 4 CAPSULES 3 TIMES A  DAY BEFORE MEALS. TAKE UP  TO 2 CAPSULES BEFORE ALL  SNACKS   Rivaroxaban 15 MG Tabs tablet Commonly known as: XARELTO Take 15 mg by mouth at bedtime.   rosuvastatin 10 MG tablet Commonly known as: CRESTOR Take 10 mg by mouth daily.   spironolactone 25 MG tablet Commonly known as: ALDACTONE Take 25 mg by mouth every morning.       Verbal and written Discharge instructions given to the patient. Wound care per Discharge AVS  Follow-up Information     Schnier, Latina Craver, MD Follow up in 1  month(s).   Specialties: Vascular Surgery, Cardiology, Radiology, Vascular Surgery Why: EVAR Contact information: [address] [place] [address] [phone]                 Signed: Marcie Bal, NP  01/04/2023, 7:21 AM

## 2023-01-04 NOTE — Progress Notes (Signed)
Patient discharged, accompanied by daughter who will stay with him over night. Gait steady, denies difficulties urinating.

## 2023-01-04 NOTE — Discharge Instructions (Signed)
No heavy lifting. Do not lift anything more than a gallon of milk.   No driving for 2 weeks.  Keep both groin incision clean dry and intact. Cover site with band aid for the next 3 days.   Follow up as scheduled.

## 2023-01-08 ENCOUNTER — Telehealth: Payer: Self-pay

## 2023-01-08 NOTE — Transitions of Care (Post Inpatient/ED Visit) (Signed)
   01/08/2023  Name: CHAE ROBLEY MRN: [id number] DOB: [birth year]  Today's TOC FU Call Status: Today's TOC FU Call Status:: Successful TOC FU Call Completed TOC FU Call Complete Date: 01/08/23  Transition Care Management Follow-up Telephone Call Date of Discharge: 01/04/23 Discharge Facility: [facility] Type of Discharge: Inpatient Admission Primary Inpatient Discharge Diagnosis:: AAA How have you been since you were released from the hospital?: Better Any questions or concerns?: No  Items Reviewed: Medications obtained,verified, and reconciled?: Yes (Medications Reviewed) Any new allergies since your discharge?: No Dietary orders reviewed?: No Do you have support at home?: Yes People in Home: child(ren), dependent  Medications Reviewed Today: Medications Reviewed Today   Medications were not reviewed in this encounter     Home Care and Equipment/Supplies: Were Home Health Services Ordered?: No Any new equipment or medical supplies ordered?: No  Functional Questionnaire: Do you need assistance with bathing/showering or dressing?: No Do you need assistance with meal preparation?: No Do you need assistance with eating?: No Do you have difficulty maintaining continence: No Do you need assistance with getting out of bed/getting out of a chair/moving?: No Do you have difficulty managing or taking your medications?: No  Follow up appointments reviewed: PCP Follow-up appointment confirmed?: No (pt declined) MD Provider Line [phone] Given: Yes Specialist Hospital Follow-up appointment confirmed?: No Do you need transportation to your follow-up appointment?: No Do you understand care options if your condition(s) worsen?: Yes-patient verbalized understanding    SIGNATURE TB,CMA

## 2023-02-05 ENCOUNTER — Other Ambulatory Visit (INDEPENDENT_AMBULATORY_CARE_PROVIDER_SITE_OTHER): Payer: Self-pay | Admitting: Vascular Surgery

## 2023-02-05 ENCOUNTER — Ambulatory Visit (INDEPENDENT_AMBULATORY_CARE_PROVIDER_SITE_OTHER): Payer: Medicare Other | Admitting: Vascular Surgery

## 2023-02-05 DIAGNOSIS — Z8679 Personal history of other diseases of the circulatory system: Secondary | ICD-10-CM

## 2023-02-05 DIAGNOSIS — I714 Abdominal aortic aneurysm, without rupture, unspecified: Secondary | ICD-10-CM

## 2023-02-06 ENCOUNTER — Ambulatory Visit (INDEPENDENT_AMBULATORY_CARE_PROVIDER_SITE_OTHER): Payer: Federal, State, Local not specified - PPO | Admitting: Nurse Practitioner

## 2023-02-06 ENCOUNTER — Ambulatory Visit (INDEPENDENT_AMBULATORY_CARE_PROVIDER_SITE_OTHER): Payer: Federal, State, Local not specified - PPO

## 2023-02-06 ENCOUNTER — Encounter (INDEPENDENT_AMBULATORY_CARE_PROVIDER_SITE_OTHER): Payer: Self-pay | Admitting: Nurse Practitioner

## 2023-02-06 VITALS — BP 126/60 | HR 75 | Resp 16 | Wt 167.0 lb

## 2023-02-06 DIAGNOSIS — Z9889 Other specified postprocedural states: Secondary | ICD-10-CM | POA: Diagnosis not present

## 2023-02-06 DIAGNOSIS — Z8679 Personal history of other diseases of the circulatory system: Secondary | ICD-10-CM

## 2023-02-06 DIAGNOSIS — I7143 Infrarenal abdominal aortic aneurysm, without rupture: Secondary | ICD-10-CM

## 2023-02-06 DIAGNOSIS — I10 Essential (primary) hypertension: Secondary | ICD-10-CM

## 2023-02-06 DIAGNOSIS — E782 Mixed hyperlipidemia: Secondary | ICD-10-CM

## 2023-02-06 DIAGNOSIS — I714 Abdominal aortic aneurysm, without rupture, unspecified: Secondary | ICD-10-CM | POA: Diagnosis not present

## 2023-02-06 NOTE — Progress Notes (Signed)
Subjective:    Patient ID: Damon Torres, male    DOB: 1940-09-07, 82 y.o.   MRN: [id number] Chief Complaint  Patient presents with   Follow-up    Ultrasound follow up    The patient returns to the office for surveillance of an abdominal aortic aneurysm status post stent graft placement on 01/02/2023.    PROCEDURE: 1. US guidance for vascular access, bilateral femoral arteries 2. Catheter placement into aorta from bilateral femoral approaches 3. Placement of a 26 x 14 x 12 C3 conformable Gore Excluder Endoprosthesis main body and a 18 x 12 ipsilateral extension limb with a 20 x 14 contralateral limb 4. Catheter placement into left lower pole accessory renal artery from right femoral approach with selective renal angiogram.   5. Coil embolization of left lower pole accessory renal artery using Ruby coils.   6. ProGlide closure devices bilateral femoral arteries   Patient denies abdominal pain or back pain, no other abdominal complaints. No groin related complaints. No symptoms consistent with distal embolization No changes in claudication distance or new rest pain symptoms.   There have been no interval changes in his overall healthcare since his last visit.   Patient denies amaurosis fugax or TIA symptoms.  The patient denies recent episodes of angina or shortness of breath.   Duplex US of the aorta and iliac arteries shows a 4.87 AAA sac with no endoleak, decrease in the sac compared to the previous study.    Review of Systems  Cardiovascular:  Negative for leg swelling.  Skin:  Negative for wound.  All other systems reviewed and are negative.      Objective:   Physical Exam Vitals reviewed.  HENT:     Head: Normocephalic.  Neck:     Vascular: No carotid bruit.  Cardiovascular:     Rate and Rhythm: Normal rate. Rhythm irregular.     Pulses: Normal pulses.     Heart sounds: Normal heart sounds.  Pulmonary:     Effort: Pulmonary effort is normal.  Skin:     General: Skin is warm and dry.  Neurological:     Mental Status: He is alert and oriented to person, place, and time.  Psychiatric:        Mood and Affect: Mood normal.        Behavior: Behavior normal.        Thought Content: Thought content normal.        Judgment: Judgment normal.     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   Chronic venous insufficiency 05/24/2021   Erectile dysfunction due to arterial insufficiency 05/06/2019   Gastric nodule 03/28/2016   Hallux valgus, left 02/26/2012   HFrEF (heart failure with reduced ejection fraction) (HCC) 10/12/2022   a.) TTE 10/12/2022: EF 40-45%, glob HK, mild-mod LV dil, mod LAE, sev RAE, mod TR/MR, mild-mod AR, PASP 52.9   Hyperlipidemia, mixed 01/06/2019   Hypertension 07/01/2012   Infrarenal abdominal  aortic aneurysm (AAA) without rupture (HCC) 07/11/2014   a.) CT AP 07/11/2014: 3.8 cm; b.) MRI abd 05/31/2015: 4.0cm; c.) AAA duplex 03/17/2019: 3.7cm; d.) AAA duplex 03/18/2020: 4.2cm; e.) AAA duplex 03/17/2021: 4.4cm; f.) AAA duplex 03/20/2022: 4.6cm; g.) 09/18/2022: 5.0cm; h.) CTA AP 09/26/2022: 5.3 x 5.1cm   Long term current use of anticoagulant    a.) rivaroxaban   Long term current use of immunosuppressive drug    a.) adalimumab for RA   Long-term corticosteroid use    a.) prednisione for RA   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   Macrocytosis 09/29/2015   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   Pancreatic insufficiency 09/29/2015   a.) on lipase/protease/amylase with meals (4 capsules TID) and with snacks (2 capsules)   Pelvic floor dysfunction 09/29/2015   Penile lesion 05/06/2019   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   Pulmonary hypertension (HCC) 10/12/2022   a.) TTE 10/12/2022: RVSP 52.9 mmHg   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; Tx'd at [facility] with systemic antineoplastic chemotherapy + XRT   Rheumatoid arthritis involving multiple joints (HCC) 08/29/2011   a.) Tx'd with adalimumab + prednisone   Right lower lobe pulmonary nodule 11/20/2022   a.) cCTA 11/20/2022: indeterminant 4 mm   Stage 3 chronic kidney disease (HCC) 02/12/2019   Subclinical hypothyroidism 06/04/2012   Tachycardia-bradycardia syndrome (HCC)    Venous stasis ulcer (HCC) 08/27/2022    Social History   Socioeconomic History   Marital status: Widowed    Spouse name: Not on file   Number of children: 2   Years of education: Not on file   Highest education level: Not on file  Occupational History   Occupation: Retired Event organiser for [employer]    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  Tobacco Use   Smoking status: Former    Passive exposure: Past   Smokeless tobacco: Never  Substance and Sexual Activity   Alcohol use: No   Drug use: No   Sexual activity: Not on file  Other Topics Concern   Not on file  Social History Narrative   Widowed 5/20   Now has living will   Daughter is health care POA   Would accept resuscitation attempts   Would not want tube feeds if cognitively unaware   Social Determinants of Health   Financial Resource Strain: Not on file  Food Insecurity: Not on file  Transportation Needs: Not on file  Physical Activity: Not on file  Stress: Not on file  Social Connections: Not on file  Intimate Partner Violence: Not on file    Past Surgical History:  Procedure Laterality Date   CARDIOVASCULAR  STRESS TEST  10/19/2022   CATARACT EXTRACTION W/PHACO Right 02/14/2016   Procedure: CATARACT EXTRACTION PHACO AND INTRAOCULAR LENS PLACEMENT (IOC);  Surgeon: Galen Manila, MD;  Location: [facility] ORS;  Service: Ophthalmology;  Laterality: Right;  [place] 01:02 AP% 18.7 CDE 11.59 Fluid pack lot # [id number]   CATARACT EXTRACTION W/PHACO Left 02/07/2021   Procedure: CATARACT EXTRACTION PHACO AND INTRAOCULAR LENS PLACEMENT (IOC) LEFT;  Surgeon: Galen Manila, MD;  Location: [facility];  Service: Ophthalmology;  Laterality: Left;  9.78 0:49.8   COLONOSCOPY     COLONOSCOPY WITH PROPOFOL N/A 01/27/2015   Procedure: COLONOSCOPY WITH PROPOFOL;  Surgeon: Elnita Maxwell, MD;  Location: [facility] ENDOSCOPY;  Service: Endoscopy;  Laterality: N/A;   DOPPLER ECHOCARDIOGRAPHY Bilateral 10/12/2022   ENDOVASCULAR REPAIR/STENT GRAFT N/A 01/02/2023   Procedure: ENDOVASCULAR REPAIR/STENT GRAFT;  Surgeon:  Annice Needy, MD;  Location: [facility] INVASIVE CV LAB;  Service: Cardiovascular;  Laterality: N/A;   Right foot surgery Right 07/05/2012   toes pinned   TRANSANAL RECTAL RESECTION  06/04/1998   [facility]   VARICOSE VEIN SURGERY  06/04/1977    Family History  Problem Relation Age of Onset   Liver cancer Mother    Breast cancer Mother    Heart disease Mother    Heart disease Brother        CABG and valve repair   Hypertension Brother    Colon cancer Neg Hx    Esophageal cancer Neg Hx    Rectal cancer Neg Hx    Stomach cancer Neg Hx    Diabetes Neg Hx     No Known Allergies     Latest Ref Rng & Units 01/03/2023    4:24 AM 01/02/2023   11:47 AM 12/24/2022   12:00 PM  CBC  WBC 4.0 - 10.5 K/uL 10.6  7.1  8.2   Hemoglobin 13.0 - 17.0 g/dL 16.1  09.6  04.5   Hematocrit 39.0 - 52.0 % 33.3  33.0  34.0   Platelets 150 - 400 K/uL 138  141  152       CMP     Component Value Date/Time   NA 131 (L) 01/03/2023 0424   K 5.4 (H) 01/03/2023 0424   CL 106 01/03/2023 0424   CO2 17 (L) 01/03/2023 0424    GLUCOSE 123 (H) 01/03/2023 0424   BUN 41 (H) 01/03/2023 0424   CREATININE 1.75 (H) 01/03/2023 0424   CALCIUM 8.5 (L) 01/03/2023 0424   PROT 6.4 (L) 01/16/2021 1424   ALBUMIN 3.9 09/10/2022 0824   AST 28 01/16/2021 1424   ALT 21 01/16/2021 1424   ALKPHOS 51 01/16/2021 1424   BILITOT 0.8 01/16/2021 1424   GFR 32.97 (L) 09/10/2022 0824   GFRNONAA 38 (L) 01/03/2023 0424     No results found.     Assessment & Plan:   1. Infrarenal abdominal aortic aneurysm (AAA) without rupture (HCC) Recommend:  Patient is status post successful endovascular repair of the AAA.   No further intervention is required at this time.   No endoleak is detected and the aneurysm sac is stable.  The patient will continue antiplatelet therapy as prescribed as well as aggressive management of hyperlipidemia. Exercise is again strongly encouraged.   However, endografts require continued surveillance with ultrasound or CT scan. This is mandatory to detect any changes that allow repressurization of the aneurysm sac.  The patient is informed that this would be asymptomatic.  The patient is reminded that lifelong routine surveillance is a necessity with an endograft. Patient will continue to follow-up at the specified interval with ultrasound of the aorta.  2. Hyperlipidemia, mixed Continue statin as ordered and reviewed, no changes at this time  3. Essential hypertension Continue antihypertensive medications as already ordered, these medications have been reviewed and there are no changes at this time.   Current Outpatient Medications on File Prior to Visit  Medication Sig Dispense Refill   aspirin EC 81 MG tablet Take 1 tablet (81 mg total) by mouth daily before breakfast. Swallow whole. 30 tablet 12   budesonide (ENTOCORT EC) 3 MG 24 hr capsule Take 6 mg by mouth daily.     dapagliflozin propanediol (FARXIGA) 10 MG TABS tablet Take 10 mg by mouth every morning.     furosemide (LASIX) 40 MG tablet Take 1  tablet (40 mg total)  by mouth daily. (Patient taking differently: Take 20 mg by mouth every morning.) 90 tablet 3   HUMIRA PEN 40 MG/0.4ML PNKT Inject 40 mg into the skin every 14 (fourteen) days.     levothyroxine (SYNTHROID) 75 MCG tablet TAKE 1 TABLET DAILY BEFORE BREAKFAST **MFR MYLAN** (Patient taking differently: Take 75 mcg by mouth daily before breakfast.) 90 tablet 3   lipase/protease/amylase (CREON) 36000 UNITS CPEP capsule TAKE 4 CAPSULES 3 TIMES A  DAY BEFORE MEALS. TAKE UP  TO 2 CAPSULES BEFORE ALL  SNACKS 1620 capsule 3   Rivaroxaban (XARELTO) 15 MG TABS tablet Take 15 mg by mouth at bedtime.     rosuvastatin (CRESTOR) 10 MG tablet Take 10 mg by mouth daily.     sacubitril-valsartan (ENTRESTO) 24-26 MG Take 1 tablet by mouth 2 (two) times daily.     spironolactone (ALDACTONE) 25 MG tablet Take 25 mg by mouth every morning.     No current facility-administered medications on file prior to visit.    There are no Patient Instructions on file for this visit. No follow-ups on file.   Georgiana Spinner, NP

## 2023-03-18 ENCOUNTER — Encounter: Payer: Self-pay | Admitting: Internal Medicine

## 2023-03-18 MED ORDER — LEVOTHYROXINE SODIUM 75 MCG PO TABS: 75.0000 ug | ORAL_TABLET | Freq: Every day | ORAL | 3 refills | Status: DC

## 2023-04-02 ENCOUNTER — Other Ambulatory Visit: Payer: Self-pay | Admitting: Internal Medicine

## 2023-04-02 ENCOUNTER — Encounter: Payer: Self-pay | Admitting: Internal Medicine

## 2023-04-02 MED ORDER — LEVOTHYROXINE SODIUM 75 MCG PO TABS: 75.0000 ug | ORAL_TABLET | Freq: Every day | ORAL | 0 refills | Status: DC

## 2023-04-02 NOTE — Addendum Note (Signed)
Addended by: Eual Fines on: 04/02/2023 04:20 PM   Modules accepted: Orders

## 2023-04-03 MED ORDER — LEVOTHYROXINE SODIUM 75 MCG PO TABS: 75.0000 ug | ORAL_TABLET | Freq: Every day | ORAL | 3 refills | Status: DC

## 2023-04-03 NOTE — Addendum Note (Signed)
Addended by: Eual Fines on: 04/03/2023 07:43 AM   Modules accepted: Orders

## 2023-06-14 ENCOUNTER — Encounter (INDEPENDENT_AMBULATORY_CARE_PROVIDER_SITE_OTHER): Payer: Self-pay | Admitting: Vascular Surgery

## 2023-06-18 ENCOUNTER — Encounter: Payer: Self-pay | Admitting: Internal Medicine

## 2023-06-18 ENCOUNTER — Ambulatory Visit (INDEPENDENT_AMBULATORY_CARE_PROVIDER_SITE_OTHER): Payer: Federal, State, Local not specified - PPO | Admitting: Internal Medicine

## 2023-06-18 VITALS — BP 136/78 | HR 50 | Temp 97.8°F | Ht 73.5 in | Wt 177.0 lb

## 2023-06-18 DIAGNOSIS — K52832 Lymphocytic colitis: Secondary | ICD-10-CM | POA: Diagnosis not present

## 2023-06-18 DIAGNOSIS — Z23 Encounter for immunization: Secondary | ICD-10-CM | POA: Diagnosis not present

## 2023-06-18 DIAGNOSIS — E039 Hypothyroidism, unspecified: Secondary | ICD-10-CM | POA: Diagnosis not present

## 2023-06-18 DIAGNOSIS — I5022 Chronic systolic (congestive) heart failure: Secondary | ICD-10-CM

## 2023-06-18 DIAGNOSIS — I714 Abdominal aortic aneurysm, without rupture, unspecified: Secondary | ICD-10-CM | POA: Diagnosis not present

## 2023-06-18 DIAGNOSIS — M069 Rheumatoid arthritis, unspecified: Secondary | ICD-10-CM

## 2023-06-18 DIAGNOSIS — I4811 Longstanding persistent atrial fibrillation: Secondary | ICD-10-CM | POA: Diagnosis not present

## 2023-06-18 DIAGNOSIS — Z0000 Encounter for general adult medical examination without abnormal findings: Secondary | ICD-10-CM | POA: Diagnosis not present

## 2023-06-18 DIAGNOSIS — N2581 Secondary hyperparathyroidism of renal origin: Secondary | ICD-10-CM

## 2023-06-18 DIAGNOSIS — N1832 Chronic kidney disease, stage 3b: Secondary | ICD-10-CM

## 2023-06-18 LAB — LIPID PANEL
Cholesterol: 160 mg/dL (ref 0–200)
HDL: 69.1 mg/dL (ref >39.00)
LDL Cholesterol: 78 mg/dL (ref 0–99)
NonHDL: 90.65
Total CHOL/HDL Ratio: 2
Triglycerides: 62 mg/dL (ref 0.0–149.0)
VLDL: 12.4 mg/dL (ref 0.0–40.0)

## 2023-06-18 LAB — HEPATIC FUNCTION PANEL
ALT: 27 U/L (ref 0–53)
AST: 33 U/L (ref 0–37)
Albumin: 3.7 g/dL (ref 3.5–5.2)
Alkaline Phosphatase: 56 U/L (ref 39–117)
Bilirubin, Direct: 0.1 mg/dL (ref 0.0–0.3)
Total Bilirubin: 0.5 mg/dL (ref 0.2–1.2)
Total Protein: 6.8 g/dL (ref 6.0–8.3)

## 2023-06-18 LAB — RENAL FUNCTION PANEL
Albumin: 3.7 g/dL (ref 3.5–5.2)
BUN: 41 mg/dL — ABNORMAL HIGH (ref 6–23)
CO2: 26 meq/L (ref 19–32)
Calcium: 9 mg/dL (ref 8.4–10.5)
Chloride: 105 meq/L (ref 96–112)
Creatinine, Ser: 1.93 mg/dL — ABNORMAL HIGH (ref 0.40–1.50)
GFR: 31.77 mL/min — ABNORMAL LOW (ref >60.00)
Glucose, Bld: 76 mg/dL (ref 70–99)
Phosphorus: 3.1 mg/dL (ref 2.3–4.6)
Potassium: 4.7 meq/L (ref 3.5–5.1)
Sodium: 139 meq/L (ref 135–145)

## 2023-06-18 LAB — T4, FREE: Free T4: 1.11 ng/dL (ref 0.60–1.60)

## 2023-06-18 LAB — TSH: TSH: 4.22 u[IU]/mL (ref 0.35–5.50)

## 2023-06-18 LAB — VITAMIN D 25 HYDROXY (VIT D DEFICIENCY, FRACTURES): VITD: 50.87 ng/mL (ref 30.00–100.00)

## 2023-06-18 MED ORDER — KETOCONAZOLE 2 % EX SHAM: 1.0000 | MEDICATED_SHAMPOO | CUTANEOUS | 2 refills | Status: DC

## 2023-06-18 NOTE — Assessment & Plan Note (Signed)
 Doing well with the humira

## 2023-06-18 NOTE — Progress Notes (Signed)
 Subjective:    Patient ID: Damon Torres, male    DOB: December 13, 1940, 83 y.o.   MRN: [id number]  HPI Here for physical  Still has some fluid in legs--not as bad. Does wear support hose. Better in AM No SOB No ulcers now No chest pain No palpitations Considering Watchman implant due to the atrial fib. Still has easy bruising so hoping to get off the xarelto  No dizziness or syncope Has been exercising regularly Sleeps flat in bed---no PND  Had endovascular repair of AAA Has had follow up and doing okay with this  Not taking creon  anymore GI retested pancreas---and seems to be working again  Humira still controlling his RA  Review of GFR measurements are 32-40 in the past year  Current Outpatient Medications on File Prior to Visit  Medication Sig Dispense Refill   budesonide  (ENTOCORT EC ) 3 MG 24 hr capsule Take 6 mg by mouth daily.     furosemide  (LASIX ) 40 MG tablet Take 1 tablet (40 mg total) by mouth daily. (Patient taking differently: Take 20 mg by mouth every morning.) 90 tablet 3   HUMIRA PEN 40 MG/0.4ML PNKT Inject 40 mg into the skin every 14 (fourteen) days.     ketoconazole  (NIZORAL ) 2 % shampoo Apply 1 Application topically 2 (two) times a week.     levothyroxine  (SYNTHROID ) 75 MCG tablet Take 1 tablet (75 mcg total) by mouth daily before breakfast. 90 tablet 3   Rivaroxaban  (XARELTO ) 15 MG TABS tablet Take 15 mg by mouth at bedtime.     rosuvastatin  (CRESTOR ) 10 MG tablet Take 10 mg by mouth daily.     sacubitril-valsartan (ENTRESTO) 24-26 MG Take 1 tablet by mouth 2 (two) times daily.     spironolactone  (ALDACTONE ) 25 MG tablet Take 25 mg by mouth every morning.     dapagliflozin propanediol (FARXIGA) 10 MG TABS tablet Take 10 mg by mouth every morning.     No current facility-administered medications on file prior to visit.    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   Chronic venous insufficiency 05/24/2021   Erectile dysfunction due to arterial insufficiency 05/06/2019   Gastric nodule 03/28/2016   Hallux valgus, left 02/26/2012   HFrEF (heart failure with reduced ejection fraction) (HCC) 10/12/2022   a.) TTE 10/12/2022: EF 40-45%, glob HK, mild-mod LV dil, mod LAE, sev RAE, mod TR/MR, mild-mod AR, PASP 52.9   Hyperlipidemia, mixed 01/06/2019   Hypertension 07/01/2012   Infrarenal abdominal aortic aneurysm (AAA) without rupture (HCC) 07/11/2014   a.) CT AP 07/11/2014: 3.8 cm; b.) MRI abd 05/31/2015: 4.0cm; c.) AAA duplex 03/17/2019: 3.7cm; d.) AAA duplex 03/18/2020: 4.2cm; e.) AAA duplex 03/17/2021: 4.4cm; f.) AAA duplex 03/20/2022: 4.6cm; g.) 09/18/2022: 5.0cm; h.) CTA AP 09/26/2022: 5.3 x 5.1cm   Long term current use of anticoagulant    a.) rivaroxaban    Long term current use of immunosuppressive drug    a.) adalimumab for RA   Long-term corticosteroid use    a.) prednisione for RA   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   Macrocytosis 09/29/2015   Osteoarthritis of left midfoot  08/30/2014   Pancreatic insufficiency 09/29/2015   a.) on lipase/protease/amylase with meals (4 capsules TID) and with snacks (2 capsules)   Pelvic floor dysfunction 09/29/2015   Penile lesion 05/06/2019   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   Pulmonary hypertension (HCC) 10/12/2022   a.) TTE 10/12/2022: RVSP 52.9 mmHg   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; Tx'd at [facility] with systemic antineoplastic chemotherapy + XRT   Rheumatoid arthritis involving multiple joints  (HCC) 08/29/2011   a.) Tx'd with adalimumab + prednisone    Right lower lobe pulmonary nodule 11/20/2022   a.) cCTA 11/20/2022: indeterminant 4 mm   Stage 3 chronic kidney disease (HCC) 02/12/2019   Subclinical hypothyroidism 06/04/2012   Tachycardia-bradycardia syndrome (HCC)    Venous stasis ulcer (HCC) 08/27/2022    Past Surgical History:  Procedure Laterality Date   CARDIOVASCULAR STRESS TEST  10/19/2022   CATARACT EXTRACTION W/PHACO Right 02/14/2016   Procedure: CATARACT EXTRACTION PHACO AND INTRAOCULAR LENS PLACEMENT (IOC);  Surgeon: Elsie Carmine, MD;  Location: [facility] ORS;  Service: Ophthalmology;  Laterality: Right;  US  01:02 AP% 18.7 CDE 11.59 Fluid pack lot # [id number]   CATARACT EXTRACTION W/PHACO Left 02/07/2021   Procedure: CATARACT EXTRACTION PHACO AND INTRAOCULAR LENS PLACEMENT (IOC) LEFT;  Surgeon: Carmine Elsie, MD;  Location: [facility];  Service: Ophthalmology;  Laterality: Left;  9.78 0:49.8   COLONOSCOPY     COLONOSCOPY WITH PROPOFOL  N/A 01/27/2015   Procedure: COLONOSCOPY WITH PROPOFOL ;  Surgeon: Donnice Vaughn Manes, MD;  Location: [facility] ENDOSCOPY;  Service: Endoscopy;  Laterality: N/A;   DOPPLER ECHOCARDIOGRAPHY Bilateral 10/12/2022   ENDOVASCULAR REPAIR/STENT GRAFT N/A 01/02/2023   Procedure: ENDOVASCULAR REPAIR/STENT GRAFT;  Surgeon: Marea Selinda RAMAN, MD;  Location: [facility] INVASIVE CV LAB;  Service: Cardiovascular;  Laterality: N/A;   Right foot surgery Right 07/05/2012   toes pinned   TRANSANAL RECTAL RESECTION  06/04/1998   [facility]   VARICOSE VEIN SURGERY  06/04/1977    Family History  Problem Relation Age of Onset   Liver cancer Mother    Breast cancer Mother    Heart disease Mother    Heart disease Brother        CABG and valve repair   Hypertension Brother    Colon cancer Neg Hx    Esophageal cancer Neg Hx    Rectal cancer Neg Hx    Stomach cancer Neg Hx    Diabetes Neg Hx     Social History   Socioeconomic History   Marital status:  Widowed    Spouse name: Not on file   Number of children: 2   Years of education: Not on file   Highest education level: Not on file  Occupational History   Occupation: Retired event organiser for [employer]    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  Tobacco Use   Smoking status: Former    Passive exposure: Past   Smokeless tobacco: Never  Substance and Sexual Activity   Alcohol use: No   Drug use: No   Sexual activity: Not on file  Other Topics Concern   Not on file  Social History Narrative   Widowed 5/20   Now has living will   Daughter is health care POA   Would accept resuscitation attempts   Would not want tube feeds if cognitively unaware   Social Drivers of Corporate Investment Banker Strain: Not on file  Food Insecurity: Not on file  Transportation Needs: Not on file  Physical  Activity: Not on file  Stress: Not on file  Social Connections: Not on file  Intimate Partner Violence: Not on file   Review of Systems  Constitutional:  Negative for fatigue and unexpected weight change.       Wears seat belt  HENT:  Negative for dental problem, hearing loss and tinnitus.        Keep up with dentist  Eyes:  Negative for visual disturbance.       No diplopia or unilateral vision loss  Respiratory:  Negative for cough, chest tightness and shortness of breath.   Cardiovascular:  Positive for leg swelling. Negative for chest pain and palpitations.  Gastrointestinal:  Negative for blood in stool and constipation.       Did have colonoscopy last April at [facility]. No colitis but still on budesonide  No heartburn  Genitourinary:  Negative for difficulty urinating and urgency.  Musculoskeletal:  Negative for arthralgias, back pain and joint swelling.  Skin:        No suspicious skin lesions  Allergic/Immunologic: Negative for environmental allergies and immunocompromised state.  Neurological:  Negative for dizziness, syncope, light-headedness and headaches.   Hematological:  Negative for adenopathy. Bruises/bleeds easily.  Psychiatric/Behavioral:  Negative for dysphoric mood and sleep disturbance. The patient is not nervous/anxious.        Objective:   Physical Exam Constitutional:      Appearance: Normal appearance.  HENT:     Mouth/Throat:     Pharynx: No oropharyngeal exudate or posterior oropharyngeal erythema.  Eyes:     Conjunctiva/sclera: Conjunctivae normal.     Pupils: Pupils are equal, round, and reactive to light.  Cardiovascular:     Rate and Rhythm: Normal rate. Rhythm irregular.     Pulses: Normal pulses.     Heart sounds: No murmur heard.    No gallop.  Pulmonary:     Effort: Pulmonary effort is normal.     Breath sounds: Normal breath sounds. No wheezing or rales.  Abdominal:     Palpations: Abdomen is soft.     Tenderness: There is no abdominal tenderness.  Musculoskeletal:     Cervical back: Neck supple.     Comments: 1+ pitting edema bilaterally  Lymphadenopathy:     Cervical: No cervical adenopathy.  Skin:    Findings: No lesion or rash.     Comments: Prominent ecchymoses mostly on arms/hands  Neurological:     General: No focal deficit present.     Mental Status: He is alert and oriented to person, place, and time.  Psychiatric:        Mood and Affect: Mood normal.        Behavior: Behavior normal.            Assessment & Plan:

## 2023-06-18 NOTE — Assessment & Plan Note (Signed)
Seems to be euthyroid on levothyroxine 79mcg ?

## 2023-06-18 NOTE — Assessment & Plan Note (Signed)
 Healthy Exercises regularly Done with cancer screening now Had flu vaccine---doesn't want COVID or shingrix Td today RSV at pharmacy

## 2023-06-18 NOTE — Assessment & Plan Note (Addendum)
 Has been relatively stable Did go down with the furosemide Refer to nephrology if any worsening --is on farxiga/valsartan

## 2023-06-18 NOTE — Assessment & Plan Note (Signed)
Quiet on the budesonide ?

## 2023-06-18 NOTE — Assessment & Plan Note (Signed)
 Rate is fine On xarelto 15mg  daily Considering watchman procedure

## 2023-06-18 NOTE — Assessment & Plan Note (Signed)
 Had endovascular repair--now with regular monitoring

## 2023-06-18 NOTE — Addendum Note (Signed)
 Addended by: Eual Fines on: 06/18/2023 09:54 AM   Modules accepted: Orders

## 2023-06-18 NOTE — Assessment & Plan Note (Signed)
 Compensated on entresto, furosemide, farxiga, spironolactone

## 2023-06-19 DIAGNOSIS — N2581 Secondary hyperparathyroidism of renal origin: Secondary | ICD-10-CM | POA: Insufficient documentation

## 2023-06-19 LAB — PARATHYROID HORMONE, INTACT (NO CA): PTH: 83 pg/mL — ABNORMAL HIGH (ref 16–77)

## 2023-06-19 NOTE — Assessment & Plan Note (Signed)
 PTH is elevated Will have him start vitamin D  despite the normal level

## 2023-07-08 ENCOUNTER — Other Ambulatory Visit: Payer: Self-pay | Admitting: Internal Medicine

## 2023-08-07 NOTE — Progress Notes (Signed)
 MRN : [id number]  Damon Torres is a 83 y.o. (March 27, 1941) male who presents with chief complaint of check circulation.  History of Present Illness: The patient returns to the office for surveillance of an abdominal aortic aneurysm status post stent graft placement on 01/02/2023.   Procedure: Placement of a 26 x 14 x 12 C3 conformable Gore Excluder Endoprosthesis main body and a 18 x 12 ipsilateral extension limb with a 20 x 14 contralateral limb Catheter placement into left lower pole accessory renal artery from right femoral approach with selective renal angiogram.   Coil embolization of left lower pole accessory renal artery using Ruby coils.    Patient denies abdominal pain or back pain, no other abdominal complaints.  No symptoms consistent with distal embolization No changes in claudication distance or new rest pain symptoms. No interval development of new ulcers or wounds  There have been no significant interval changes in his overall healthcare since his last visit.   Patient denies amaurosis fugax or TIA symptoms.  The patient denies recent episodes of angina or shortness of breath.   Duplex US of the aorta and iliac arteries shows a 4.5 AAA sac with no endoleak, decrease in the sac compared to the previous study (previous AAA sac measured 4.9 cm).  No outpatient medications have been marked as taking for the 08/12/23 encounter (Appointment) with Gilda Crease, Latina Craver, MD.    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   Chronic venous insufficiency 05/24/2021   Erectile dysfunction due to arterial insufficiency 05/06/2019   Gastric nodule 03/28/2016   Hallux valgus, left 02/26/2012   HFrEF (heart failure with reduced ejection fraction) (HCC) 10/12/2022   a.) TTE 10/12/2022: EF 40-45%, glob HK, mild-mod LV dil, mod LAE, sev RAE, mod TR/MR, mild-mod AR, PASP 52.9   Hyperlipidemia, mixed 01/06/2019   Hypertension 07/01/2012   Infrarenal abdominal aortic aneurysm (AAA) without rupture (HCC) 07/11/2014   a.) CT AP 07/11/2014: 3.8 cm; b.) MRI abd 05/31/2015: 4.0cm; c.) AAA duplex 03/17/2019: 3.7cm; d.) AAA duplex 03/18/2020: 4.2cm; e.) AAA duplex 03/17/2021: 4.4cm; f.) AAA duplex 03/20/2022: 4.6cm; g.) 09/18/2022: 5.0cm; h.) CTA AP 09/26/2022: 5.3 x 5.1cm   Long term current use of anticoagulant    a.) rivaroxaban   Long term current use of immunosuppressive drug    a.) adalimumab for RA   Long-term corticosteroid use    a.) prednisione for RA   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   Macrocytosis 09/29/2015   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   Pancreatic insufficiency 09/29/2015   a.) on lipase/protease/amylase with meals (4 capsules TID) and with snacks (2 capsules)   Pelvic floor dysfunction 09/29/2015   Penile lesion 05/06/2019   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  Pulmonary hypertension (HCC) 10/12/2022   a.) TTE 10/12/2022: RVSP 52.9 mmHg   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; Tx'd at [facility] with systemic antineoplastic chemotherapy + XRT   Rheumatoid arthritis involving multiple joints (HCC) 08/29/2011   a.) Tx'd with adalimumab + prednisone   Right lower lobe pulmonary nodule 11/20/2022   a.) cCTA 11/20/2022: indeterminant 4 mm   Stage 3 chronic kidney disease (HCC) 02/12/2019   Subclinical hypothyroidism 06/04/2012   Tachycardia-bradycardia syndrome (HCC)    Venous stasis ulcer (HCC) 08/27/2022    Past  Surgical History:  Procedure Laterality Date   CARDIOVASCULAR STRESS TEST  10/19/2022   CATARACT EXTRACTION W/PHACO Right 02/14/2016   Procedure: CATARACT EXTRACTION PHACO AND INTRAOCULAR LENS PLACEMENT (IOC);  Surgeon: Galen Manila, MD;  Location: [facility] ORS;  Service: Ophthalmology;  Laterality: Right;  [place] 01:02 AP% 18.7 CDE 11.59 Fluid pack lot # [id number]   CATARACT EXTRACTION W/PHACO Left 02/07/2021   Procedure: CATARACT EXTRACTION PHACO AND INTRAOCULAR LENS PLACEMENT (IOC) LEFT;  Surgeon: Galen Manila, MD;  Location: [facility];  Service: Ophthalmology;  Laterality: Left;  9.78 0:49.8   COLONOSCOPY     COLONOSCOPY WITH PROPOFOL N/A 01/27/2015   Procedure: COLONOSCOPY WITH PROPOFOL;  Surgeon: Elnita Maxwell, MD;  Location: [facility] ENDOSCOPY;  Service: Endoscopy;  Laterality: N/A;   DOPPLER ECHOCARDIOGRAPHY Bilateral 10/12/2022   ENDOVASCULAR REPAIR/STENT GRAFT N/A 01/02/2023   Procedure: ENDOVASCULAR REPAIR/STENT GRAFT;  Surgeon: Annice Needy, MD;  Location: [facility] INVASIVE CV LAB;  Service: Cardiovascular;  Laterality: N/A;   Right foot surgery Right 07/05/2012   toes pinned   TRANSANAL RE[facility]  [facility]   VARICOSE VEIN SURGERY  06/04/1977    Social History Social History   Tobacco Use   Smoking status: Former    Passive exposure: Past   Smokeless tobacco: Never  Substance Use Topics   Alcohol use: No   Drug use: No    Family History Family History  Problem Relation Age of Onset   Liver cancer Mother    Breast cancer Mother    Heart disease Mother    Heart disease Brother        CABG and valve repair   Hypertension Brother    Colon cancer Neg Hx    Esophageal cancer Neg Hx    Rectal cancer Neg Hx    Stomach cancer Neg Hx    Diabetes Neg Hx     No Known Allergies   REVIEW OF SYSTEMS (Negative unless checked)  Constitutional: [] Weight loss  [] Fever  [] Chills Cardiac: [] Chest pain   [] Chest pressure   [] Palpitations    [] Shortness of breath when laying flat   [] Shortness of breath with exertion. Vascular:  [x] Pain in legs with walking   [] Pain in legs at rest  [] History of DVT   [] Phlebitis   [] Swelling in legs   [] Varicose veins   [] Non-healing ulcers Pulmonary:   [] Uses home oxygen   [] Productive cough   [] Hemoptysis   [] Wheeze  [] COPD   [] Asthma Neurologic:  [] Dizziness   [] Seizures   [] History of stroke   [] History of TIA  [] Aphasia   [] Vissual changes   [] Weakness or numbness in arm   [] Weakness or numbness in leg Musculoskeletal:   [] Joint swelling   [] Joint pain   [] Low back pain Hematologic:  [] Easy bruising  [] Easy bleeding   [] Hypercoagulable state   [] Anemic Gastrointestinal:  [] Diarrhea   [] Vomiting  [] Gastroesophageal reflux/heartburn   [] Difficulty swallowing. Genitourinary:  []   Chronic kidney disease   [] Difficult urination  [] Frequent urination   [] Blood in urine Skin:  [] Rashes   [] Ulcers  Psychological:  [] History of anxiety   []  History of major depression.  Physical Examination  There were no vitals filed for this visit. There is no height or weight on file to calculate BMI. Gen: WD/WN, NAD Head: [place]/AT, No temporalis wasting.  Ear/Nose/Throat: Hearing grossly intact, nares w/o erythema or drainage Eyes: PER, EOMI, sclera nonicteric.  Neck: Supple, no masses.  No bruit or JVD.  Pulmonary:  Good air movement, no audible wheezing, no use of accessory muscles.  Cardiac: RRR, normal S1, S2, no Murmurs. Vascular:  no open wounds.  scattered varicosities present bilaterally.  Mild venous stasis changes to the legs bilaterally.  1-2+ soft pitting edema. CEAP C4sEpAsPr.  Popliteal pulses are not enlarged Vessel Right Left  Radial Palpable Palpable  PT Not Palpable Not Palpable  DP Not Palpable Not Palpable  Gastrointestinal: soft, non-distended. No guarding/no peritoneal signs.  Musculoskeletal: M/S 5/5 throughout.  No visible deformity.  Neurologic: CN 2-12 intact. Pain and light touch  intact in extremities.  Symmetrical.  Speech is fluent. Motor exam as listed above. Psychiatric: Judgment intact, Mood & affect appropriate for pt's clinical situation. Dermatologic: No rashes or ulcers noted.  No changes consistent with cellulitis.   CBC Lab Results  Component Value Date   WBC 10.6 (H) 01/03/2023   HGB 11.0 (L) 01/03/2023   HCT 33.3 (L) 01/03/2023   MCV 105.7 (H) 01/03/2023   PLT 138 (L) 01/03/2023    BMET    Component Value Date/Time   NA 139 06/18/2023 0849   K 4.7 06/18/2023 0849   CL 105 06/18/2023 0849   CO2 26 06/18/2023 0849   GLUCOSE 76 06/18/2023 0849   BUN 41 (H) 06/18/2023 0849   CREATININE 1.93 (H) 06/18/2023 0849   CALCIUM 9.0 06/18/2023 0849   GFRNONAA 38 (L) 01/03/2023 0424   CrCl cannot be calculated (Patient's most recent lab result is older than the maximum 21 days allowed.).  COAG Lab Results  Component Value Date   INR 1.3 (H) 01/02/2023    Radiology No results found.   Assessment/Plan 1. Infrarenal abdominal aortic aneurysm (AAA) without rupture (HCC) (Primary) Recommend:  Patient is status post successful endovascular repair of the AAA.   No further intervention is required at this time.   No endoleak is detected and the aneurysm sac is stable.  The patient will continue antiplatelet therapy as prescribed as well as aggressive management of hyperlipidemia. Exercise is encouraged.   However, endografts require continued surveillance with ultrasound or CT scan. This is mandatory to detect any changes that allow repressurization of the aneurysm sac.  The patient is informed that this would be asymptomatic.  The patient is reminded that lifelong routine surveillance is a necessity with an endograft. Patient will continue to follow-up at the specified interval with ultrasound of the aorta. - VAS US AORTA/IVC/ILIACS; Future  2. PAD (peripheral artery disease) (HCC)  Recommend:  The patient has evidence of atherosclerosis of  the lower extremities with claudication.  The patient does not voice lifestyle limiting changes at this point in time.  No invasive studies, angiography or surgery at this time The patient should continue walking and begin a more formal exercise program.  The patient should continue antiplatelet therapy and aggressive treatment of the lipid abnormalities  No changes in the patient's medications at this time  Continued surveillance is indicated as atherosclerosis is  likely to progress with time.    The patient will continue follow up with noninvasive studies as ordered.  - VAS [place] ABI WITH/WO TBI; Future  3. Chronic venous insufficiency No surgery or intervention at this point in time.   The patient is CEAP C4sEpAsPr   I have discussed with the patient venous insufficiency and why it  causes symptoms. I have discussed with the patient the chronic skin changes that accompany venous insufficiency and the long term sequela such as infection and ulceration.  Patient will begin wearing graduated compression stockings or compression wraps on a daily basis.  The patient will put the compression on first thing in the morning and removing them in the evening. The patient is instructed specifically not to sleep in the compression.    In addition, behavioral modification including several periods of elevation of the lower extremities during the day will be continued. I have demonstrated that proper elevation is a position with the ankles at heart level.  The patient is instructed to begin routine exercise, especially walking on a daily basis  The patient will be assessed for a Lymph Pump depending on the effectiveness of conservative therapy and the control of the associated lymphedema.  4. Essential hypertension Continue antihypertensive medications as already ordered, these medications have been reviewed and there are no changes at this time.  5. Hyperlipidemia, mixed Continue statin as ordered and  reviewed, no changes at this time    Levora Dredge, MD  08/07/2023 1:09 PM

## 2023-08-08 ENCOUNTER — Other Ambulatory Visit (INDEPENDENT_AMBULATORY_CARE_PROVIDER_SITE_OTHER): Payer: Medicare Other

## 2023-08-08 ENCOUNTER — Other Ambulatory Visit (INDEPENDENT_AMBULATORY_CARE_PROVIDER_SITE_OTHER): Payer: Self-pay | Admitting: Vascular Surgery

## 2023-08-08 ENCOUNTER — Ambulatory Visit (INDEPENDENT_AMBULATORY_CARE_PROVIDER_SITE_OTHER): Payer: Federal, State, Local not specified - PPO | Admitting: Vascular Surgery

## 2023-08-08 DIAGNOSIS — I714 Abdominal aortic aneurysm, without rupture, unspecified: Secondary | ICD-10-CM

## 2023-08-12 ENCOUNTER — Ambulatory Visit (INDEPENDENT_AMBULATORY_CARE_PROVIDER_SITE_OTHER): Payer: Medicare Other

## 2023-08-12 ENCOUNTER — Encounter (INDEPENDENT_AMBULATORY_CARE_PROVIDER_SITE_OTHER): Payer: Self-pay | Admitting: Vascular Surgery

## 2023-08-12 ENCOUNTER — Ambulatory Visit (INDEPENDENT_AMBULATORY_CARE_PROVIDER_SITE_OTHER): Payer: Federal, State, Local not specified - PPO | Admitting: Vascular Surgery

## 2023-08-12 VITALS — BP 148/76 | HR 66 | Resp 16 | Wt 176.0 lb

## 2023-08-12 DIAGNOSIS — I714 Abdominal aortic aneurysm, without rupture, unspecified: Secondary | ICD-10-CM

## 2023-08-12 DIAGNOSIS — E782 Mixed hyperlipidemia: Secondary | ICD-10-CM

## 2023-08-12 DIAGNOSIS — I872 Venous insufficiency (chronic) (peripheral): Secondary | ICD-10-CM | POA: Diagnosis not present

## 2023-08-12 DIAGNOSIS — I7143 Infrarenal abdominal aortic aneurysm, without rupture: Secondary | ICD-10-CM

## 2023-08-12 DIAGNOSIS — I10 Essential (primary) hypertension: Secondary | ICD-10-CM

## 2023-08-12 DIAGNOSIS — I739 Peripheral vascular disease, unspecified: Secondary | ICD-10-CM

## 2023-08-15 ENCOUNTER — Ambulatory Visit: Payer: Federal, State, Local not specified - PPO | Admitting: Dermatology

## 2023-09-26 ENCOUNTER — Other Ambulatory Visit: Payer: Self-pay | Admitting: Internal Medicine

## 2023-10-21 ENCOUNTER — Ambulatory Visit: Payer: Self-pay

## 2023-10-21 NOTE — Telephone Encounter (Signed)
 Called and spoke to pt. Made OV for tomorrow at 815. If his symptoms change or worsen, he will go somewhere to be evaluated.

## 2023-10-21 NOTE — Telephone Encounter (Signed)
  Chief Complaint: calf swelling Symptoms: L>R, pain, swelling, L calf discoloration Frequency: x 3 days Pertinent Negatives: Patient denies fever, CP, SOB Disposition: [] ED /[x] Urgent Care (no appt availability in office) / [] Appointment(In office/virtual)/ []  [facility]/ [] Home Care/ [] Refused Recommended Disposition /[] [facility]/ []  Follow-up with PCP Additional Notes: Pt c/o bilateral calf swelling L>R, pain and L calf discoloration x 3 days. Pt does endorse having fluid issues, and reports taking daily lasix  40 mg, with additional 20 mg x3 days without relief of sx. Triager attempted to schedule with PCP, but no access. Triager then attempted to schedule with [facility], but unable to. Triager instructed pt to go to [facility] as walk-in. Patient verbalized understanding and to call back/911 with worsening symptoms.    Copied from CRM [id number]. Topic: Clinical - Red Word Triage >> Oct 21, 2023  8:30 AM Bambi Bonine D wrote: Red Word that prompted transfer to Nurse Triage: Pain, Possible Swelling  Pt stated that he has had issues with fluids in his legs and now he has a big blue bruise on his leg. Pt stated that its starts at his knees on down and there may be some swelling. Pt stated that he is also experiencing pain. Reason for Disposition  [1] Thigh, calf, or ankle swelling AND [2] bilateral AND [3] 1 side is more swollen  Answer Assessment - Initial Assessment Questions 1. ONSET: "When did the pain start?"      X3 days Pt endorses having "trouble with fluid" - endorses taking "water pills" and has been taking 40 mg Lasix  daily, and 20 mg additional x 3 days 2. LOCATION: "Where is the pain located?"      Bilateral leg, L>R 3. PAIN: "How bad is the pain?"    (Scale 1-10; or mild, moderate, severe)   -  MILD (1-3): doesn't interfere with normal activities    -  MODERATE (4-7): interferes with normal activities (e.g., work or school) or awakens from sleep, limping    -   SEVERE (8-10): excruciating pain, unable to do any normal activities, unable to walk     Mild pain - able to walk 4. WORK OR EXERCISE: "Has there been any recent work or exercise that involved this part of the body?"      denies 5. CAUSE: "What do you think is causing the leg pain?"     fluid 6. OTHER SYMPTOMS: "Do you have any other symptoms?" (e.g., chest pain, back pain, breathing difficulty, swelling, rash, fever, numbness, weakness)     L leg with blue bruise from knee down, swelling Denies other sx above 7. PREGNANCY: "Is there any chance you are pregnant?" "When was your last menstrual period?"     N/a  Protocols used: Leg Pain-A-AH

## 2023-10-22 ENCOUNTER — Encounter (INDEPENDENT_AMBULATORY_CARE_PROVIDER_SITE_OTHER): Payer: Self-pay

## 2023-10-22 ENCOUNTER — Encounter: Payer: Self-pay | Admitting: Internal Medicine

## 2023-10-22 ENCOUNTER — Ambulatory Visit: Admitting: Internal Medicine

## 2023-10-22 VITALS — BP 126/72 | HR 48 | Temp 98.2°F | Ht 73.5 in | Wt 165.0 lb

## 2023-10-22 DIAGNOSIS — S8012XA Contusion of left lower leg, initial encounter: Secondary | ICD-10-CM | POA: Diagnosis not present

## 2023-10-22 NOTE — Progress Notes (Signed)
 Subjective:    Patient ID: Damon Torres, male    DOB: [birth year], 83 y.o.   MRN: [id number]  HPI Here due to left leg swelling  Trouble with swelling over last 4 days or so No injury or change in routine Doesn't use salt Worst area is in back of calf--above ankle Blue discoloration in ankle area Some pain  Takes furosemide  20--from cardiologist Then increased to 40 and then 60mg  Some better now  Current Outpatient Medications on File Prior to Visit  Medication Sig Dispense Refill   budesonide  (ENTOCORT EC ) 3 MG 24 hr capsule Take 6 mg by mouth daily.     empagliflozin (JARDIANCE) 10 MG TABS tablet Take 1 tablet by mouth daily.     furosemide  (LASIX ) 40 MG tablet TAKE 1 TABLET(40 MG) BY MOUTH DAILY 90 tablet 3   HUMIRA PEN 40 MG/0.4ML PNKT Inject 40 mg into the skin every 14 (fourteen) days.     ketoconazole  (NIZORAL ) 2 % shampoo APPLY TOPICALLY 2 TIMES A WEEK 120 mL 2   levothyroxine  (SYNTHROID ) 75 MCG tablet Take 1 tablet (75 mcg total) by mouth daily before breakfast. 90 tablet 3   Rivaroxaban  (XARELTO ) 15 MG TABS tablet Take 15 mg by mouth at bedtime.     rosuvastatin  (CRESTOR ) 10 MG tablet Take 10 mg by mouth daily.     sacubitril-valsartan (ENTRESTO) 24-26 MG Take 1 tablet by mouth 2 (two) times daily.     spironolactone  (ALDACTONE ) 25 MG tablet Take 25 mg by mouth every morning.     No current facility-administered medications on file prior to visit.    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/08/2020   Bilateral elbow joint pain 12/16/2020   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    Chronic venous insufficiency 05/24/2021   Erectile dysfunction due to arterial insufficiency 05/06/2019   Gastric nodule 03/28/2016   Hallux valgus, left 02/26/2012   HFrEF (heart failure with reduced ejection fraction) (HCC) 10/12/2022   a.) TTE 10/12/2022: EF 40-45%, glob HK, mild-mod LV dil, mod LAE, sev RAE, mod TR/MR, mild-mod AR, PASP 52.9   Hyperlipidemia, mixed 01/06/2019   Hypertension 07/01/2012   Infrarenal abdominal aortic aneurysm (AAA) without rupture (HCC) 07/11/2014   a.) CT AP 07/11/2014: 3.8 cm; b.) MRI abd 05/31/2015: 4.0cm; c.) AAA duplex 03/17/2019: 3.7cm; d.) AAA duplex 03/18/2020: 4.2cm; e.) AAA duplex 03/17/2021: 4.4cm; f.) AAA duplex 03/20/2022: 4.6cm; g.) 09/18/2022: 5.0cm; h.) CTA AP 09/26/2022: 5.3 x 5.1cm   Long term current use of anticoagulant    a.) rivaroxaban    Long term current use of immunosuppressive drug    a.) adalimumab for RA   Long-term corticosteroid use    a.) prednisione for RA   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   Macrocytosis 09/29/2015   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   Pancreatic insufficiency 09/29/2015   a.) on lipase/protease/amylase with meals (4 capsules TID) and with snacks (2 capsules)   Pelvic floor dysfunction 09/29/2015   Penile lesion 05/06/2019   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   Pulmonary hypertension (HCC) 10/12/2022   a.) TTE 10/12/2022: RVSP 52.9 mmHg   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; Tx'd at  [facility] with systemic antineoplastic chemotherapy + XRT   Rheumatoid arthritis involving multiple joints (HCC) 08/29/2011   a.) Tx'd with adalimumab + prednisone    Right lower lobe pulmonary nodule 11/20/2022   a.) cCTA 11/20/2022: indeterminant 4 mm   Stage 3 chronic kidney disease (HCC) 02/12/2019   Subclinical hypothyroidism 06/04/2012   Tachycardia-bradycardia syndrome (HCC)    Venous stasis ulcer (HCC) 08/27/2022    Past Surgical History:  Procedure Laterality Date    CARDIOVASCULAR STRESS TEST  10/19/2022   CATARACT EXTRACTION W/PHACO Right 02/14/2016   Procedure: CATARACT EXTRACTION PHACO AND INTRAOCULAR LENS PLACEMENT (IOC);  Surgeon: Clair Crews, MD;  Location: [facility] ORS;  Service: Ophthalmology;  Laterality: Right;  [place]  01:02 AP% 18.7 CDE 11.59 Fluid pack lot # [id number]   CATARACT EXTRACTION W/PHACO Left 02/07/2021   Procedure: CATARACT EXTRACTION PHACO AND INTRAOCULAR LENS PLACEMENT (IOC) LEFT;  Surgeon: Clair Crews, MD;  Location: [facility];  Service: Ophthalmology;  Laterality: Left;  9.78 0:49.8   COLONOSCOPY     COLONOSCOPY WITH PROPOFOL  N/A 01/27/2015   Procedure: COLONOSCOPY WITH PROPOFOL ;  Surgeon: Luella Sager, MD;  Location: [facility] ENDOSCOPY;  Service: Endoscopy;  Laterality: N/A;   DOPPLER ECHOCARDIOGRAPHY Bilateral 10/12/2022   ENDOVASCULAR REPAIR/STENT GRAFT N/A 01/02/2023   Procedure: ENDOVASCULAR REPAIR/STENT GRAFT;  Surgeon: Celso College, MD;  Location: [facility] INVASIVE CV LAB;  Service: Cardiovascular;  Laterality: N/A;   Right foot surgery Right 07/05/2012   toes pinned   TRANSANAL RECTAL RESECTION  06/04/1998   [facility]   VARICOSE VEIN SURGERY  06/04/1977    Family History  Problem Relation Age of Onset   Liver cancer Mother    Breast cancer Mother    Heart disease Mother    Heart disease Brother        CABG and valve repair   Hypertension Brother    Colon cancer Neg Hx    Esophageal cancer Neg Hx    Rectal cancer Neg Hx    Stomach cancer Neg Hx    Diabetes Neg Hx     Social History   Socioeconomic History   Marital status: Widowed    Spouse name: Not on file   Number of children: 2   Years of education: Not on file   Highest education level: Not on file  Occupational History   Occupation: Retired Event organiser for [employer]    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  Tobacco Use   Smoking status: Former    Passive exposure: Past   Smokeless tobacco: Never  Substance and Sexual  Activity   Alcohol use: No   Drug use: No   Sexual activity: Not on file  Other Topics Concern   Not on file  Social History Narrative   Widowed 5/20   Now has living will   Daughter is health care POA   Would accept resuscitation attempts   Would not want tube feeds if cognitively unaware   Social Drivers of Corporate investment banker Strain: Not on file  Food Insecurity: Not on file  Transportation Needs: Not on file  Physical Activity: Not on file  Stress: Not on file  Social Connections: Not on file  Intimate Partner Violence: Not on file   Review of Systems No chest pain No SOB No open areas or ulcers    Objective:   Physical Exam Constitutional:      Appearance: Normal appearance.  Cardiovascular:     Comments: Faint pedal pulses Musculoskeletal:  Comments: Mild symmetric ankle swelling-- 1+ Ecchymotic changes in lower left calf--posteromedial Isolated ecchymosis in upper calf--no mass or tenderness  Neurological:     Mental Status: He is alert.            Assessment & Plan:

## 2023-10-22 NOTE — Assessment & Plan Note (Signed)
 Discussed that the discoloration is caused by blood--not sure if only upper area with unrecognized trauma--and then seeped down, or both areas Not stasis changes Discussed that this is more likely with the apixaban (and also the reason why a DVT would be unlikely--and the findings don't indicate that at all) Reassured--this should resolve on its own Continue normal activity and furosemide 

## 2023-12-08 ENCOUNTER — Other Ambulatory Visit: Payer: Self-pay | Admitting: Internal Medicine

## 2023-12-16 ENCOUNTER — Encounter: Payer: Self-pay | Admitting: Internal Medicine

## 2023-12-16 MED ORDER — LEVOTHYROXINE SODIUM 75 MCG PO TABS: 75.0000 ug | ORAL_TABLET | Freq: Every day | ORAL | 1 refills | Status: DC

## 2024-01-13 ENCOUNTER — Encounter: Payer: Self-pay | Admitting: Internal Medicine

## 2024-01-22 ENCOUNTER — Other Ambulatory Visit: Payer: Self-pay | Admitting: Internal Medicine

## 2024-01-23 ENCOUNTER — Encounter: Payer: Self-pay | Admitting: Internal Medicine

## 2024-01-24 MED ORDER — LEVOTHYROXINE SODIUM 75 MCG PO TABS: 75.0000 ug | ORAL_TABLET | Freq: Every day | ORAL | 1 refills | Status: AC

## 2024-03-31 ENCOUNTER — Other Ambulatory Visit: Payer: Self-pay | Admitting: Family

## 2024-03-31 MED ORDER — RIVAROXABAN 15 MG PO TABS: 15.0000 mg | ORAL_TABLET | Freq: Every day | ORAL | 1 refills | Status: DC

## 2024-03-31 MED ORDER — RIVAROXABAN 15 MG PO TABS: 15.0000 mg | ORAL_TABLET | Freq: Every day | ORAL | 1 refills | Status: AC

## 2024-06-19 ENCOUNTER — Ambulatory Visit

## 2024-06-19 DIAGNOSIS — I10 Essential (primary) hypertension: Secondary | ICD-10-CM | POA: Diagnosis not present

## 2024-06-19 DIAGNOSIS — D638 Anemia in other chronic diseases classified elsewhere: Secondary | ICD-10-CM | POA: Diagnosis not present

## 2024-06-19 DIAGNOSIS — D649 Anemia, unspecified: Secondary | ICD-10-CM

## 2024-06-19 DIAGNOSIS — D7589 Other specified diseases of blood and blood-forming organs: Secondary | ICD-10-CM

## 2024-06-19 DIAGNOSIS — N1832 Chronic kidney disease, stage 3b: Secondary | ICD-10-CM | POA: Diagnosis not present

## 2024-06-19 DIAGNOSIS — E038 Other specified hypothyroidism: Secondary | ICD-10-CM

## 2024-06-19 DIAGNOSIS — I5022 Chronic systolic (congestive) heart failure: Secondary | ICD-10-CM

## 2024-06-19 LAB — IBC + FERRITIN
Ferritin: 73.6 ng/mL (ref 22.0–322.0)
Iron: 80 ug/dL (ref 42–165)
Saturation Ratios: 29.3 % (ref 20.0–50.0)
TIBC: 273 ug/dL (ref 250.0–450.0)
Transferrin: 195 mg/dL — ABNORMAL LOW (ref 212.0–360.0)

## 2024-06-19 LAB — MICROALBUMIN / CREATININE URINE RATIO
Creatinine,U: 30.9 mg/dL
Microalb Creat Ratio: 154.8 mg/g — ABNORMAL HIGH (ref 0.0–30.0)
Microalb, Ur: 4.8 mg/dL — ABNORMAL HIGH (ref 0.7–1.9)

## 2024-06-19 LAB — B12 AND FOLATE PANEL
Folate: >23.4 ng/mL
Vitamin B-12: 410 pg/mL (ref 211–911)

## 2024-06-19 LAB — TSH: TSH: 2.9 u[IU]/mL (ref 0.35–5.50)

## 2024-06-19 LAB — BASIC METABOLIC PANEL WITH GFR
BUN: 48 mg/dL — ABNORMAL HIGH (ref 6–23)
CO2: 27 meq/L (ref 19–32)
Calcium: 9 mg/dL (ref 8.4–10.5)
Chloride: 104 meq/L (ref 96–112)
Creatinine, Ser: 1.88 mg/dL — ABNORMAL HIGH (ref 0.40–1.50)
GFR: 32.56 mL/min — ABNORMAL LOW
Glucose, Bld: 79 mg/dL (ref 70–99)
Potassium: 5.2 meq/L — ABNORMAL HIGH (ref 3.5–5.1)
Sodium: 137 meq/L (ref 135–145)

## 2024-06-19 LAB — VITAMIN D 25 HYDROXY (VIT D DEFICIENCY, FRACTURES): VITD: 62.19 ng/mL (ref 30.00–100.00)

## 2024-06-19 NOTE — Patient Instructions (Signed)
 Thank you for visiting [facility] today! Here's what we talked about: - Continue all meds as prescribed

## 2024-06-19 NOTE — Addendum Note (Signed)
 Addended by: HOPE VEVA PARAS on: 06/19/2024 09:58 AM   Modules accepted: Orders

## 2024-06-19 NOTE — Progress Notes (Signed)
 "  Subjective:   This visit was conducted in person. The patient gave informed consent to the use of [employer] AI technology to record the contents of the encounter as documented below.   Patient ID: Damon Torres, male    DOB: 08-26-1940, 84 y.o.   MRN: [id number]   Discussed the use of AI scribe software for clinical note transcription with the patient, who gave verbal consent to proceed.  History of Present Illness Damon Torres is an 84 year old male with heart failure, tachycardia-bradycardia syndrome, and atrial fibrillation who presents for medication management and follow-up.  He has a history of heart failure, tachycardia-bradycardia syndrome, and atrial fibrillation. His current medications include Crestor  10 mg, Entresto one tablet twice daily, spironolactone  one tablet daily, Xarelto  one tablet at bedtime, and Lasix  40 mg daily, with occasional use of 20 mg if leg swelling is not severe. Fluid retention is a significant issue. He recently saw a cardiologist who restarted his Crestor .  He has hypothyroidism and takes Synthroid  75 mcg on an empty stomach before breakfast, ensuring it is separate from other medications by at least an hour. His thyroid  levels were last checked a year ago.  He has rheumatoid arthritis and is on Humira, with recent follow-up with a rheumatologist. He also has lymphocytic colitis, for which he takes budesonide  9 mg. Attempts to taper the budesonide  result in increased symptoms. He experiences easy bruising.  He has chronic kidney disease with a GFR of 33, last checked six months ago. He does not currently see a nephrologist. He takes Jardiance 10 mg daily.  He has a history of anemia, which he attributes to chronic disease, including rheumatoid arthritis and kidney disease. His anemia has been present for 30 years. His anemia labs were recently checked by his rheumatologist.  Socially, he is a former smoker, having quit in 1990, and does not consume  alcohol. He lives alone following the loss of his wife in 2020 and stays active by working out at [employer] daily. He has a living will and his daughter is his healthcare power of attorney.      Review of Systems  All other systems reviewed and are negative.       Allergies[1]  Medications Ordered Prior to Encounter[2]  BP 138/76 (BP Location: Left Arm, Patient Position: Sitting, Cuff Size: Normal)   Pulse (!) 44   Temp 97.8 F (36.6 C) (Oral)   Ht 5' 11.5 (1.816 m)   Wt 168 lb (76.2 kg)   SpO2 99%   BMI 23.10 kg/m   Objective:      Physical Exam VITALS: BP- 138/76 GENERAL: Alert, cooperative, well developed, no acute distress. HEAD: Normocephalic atraumatic. EXTREMITIES: No cyanosis or edema. NEUROLOGICAL: Oriented to person, place and time, no gait abnormalities, moves all extremities without gross motor or sensory deficit. 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 Monitoring essential due to heart failure and medication use. No current NSAIDs on medication list. - Ordered urine microalbumin - Ordered potassium level. - Ordered vitamin D  level, PTH, calcium  - Ordered iron panel. -Continue Jardiance 10 mg daily - Continue Entresto 1 tablet twice daily  Essential hypertension BP 138/76, slightly above target <130/80 due to kidney disease. Tight control challenging with age.  Will refrain from medication change at this time and allow more time for monitoring/gather  more data. - Continue current antihypertensive regimen including Lasix , Entresto and spironolactone  as below.  Heart failure Managed with Entresto, spironolactone , and Lasix  and Jardiance. Fluid retention significant, there may also be an element of cardiorenal syndrome.  He does follow with cardiology.  No signs or symptoms of fluid overload today.  - Continue Entresto 1 tablet twice  daily. - Continue spironolactone  1 tablet daily. - Continue Lasix  40 mg daily, with occasional 20 mg dose if symptoms mild. - Ordered BMP given he is on diuretic.   Other specified hypothyroidism Managed with Synthroid  75 mcg. Taken on empty stomach separate from other meds. Last thyroid  check a year ago. - Ordered thyroid  hormone level. - Continue Synthroid  75 mcg daily.  Macrocytosis Anemia of chronic disease Likely related to chronic conditions such as rheumatoid arthritis and kidney disease. Macrocytosis may be a medication side effect of Humira, B12 levels normal a year ago. Refraining from reordering CBC as Hb was only mildly low at 11 three months ago - Ordered B12 and folic acid  levels. - Ordered iron level.     Return in about 3 weeks (around 07/10/2024) for CPE, no fasting labs.   Damon Liptak K Karalyn Kadel, MD  06/19/24     Contains text generated by [employer]bridge.       [1] No Known Allergies [2]  Current Outpatient Medications on File Prior to Visit  Medication Sig Dispense Refill   budesonide  (ENTOCORT EC ) 3 MG 24 hr capsule Take 6 mg by mouth daily. (Patient taking differently: Take 9 mg by mouth daily.)     empagliflozin (JARDIANCE) 10 MG TABS tablet Take 1 tablet by mouth daily.     furosemide  (LASIX ) 20 MG tablet Take 20 mg by mouth daily as needed for fluid or edema.     furosemide  (LASIX ) 40 MG tablet TAKE 1 TABLET(40 MG) BY MOUTH DAILY 90 tablet 3   HUMIRA PEN 40 MG/0.4ML PNKT Inject 40 mg into the skin every 14 (fourteen) days.     levothyroxine  (SYNTHROID ) 75 MCG tablet Take 1 tablet (75 mcg total) by mouth daily before breakfast. TAKE 1 TABLET(75 MCG) BY MOUTH DAILY BEFORE BREAKFAST 90 tablet 1   Rivaroxaban  (XARELTO ) 15 MG TABS tablet Take 1 tablet (15 mg total) by mouth at bedtime. 90 tablet 1   rosuvastatin  (CRESTOR ) 10 MG tablet Take 10 mg by mouth daily.     sacubitril-valsartan (ENTRESTO) 24-26 MG Take 1 tablet by mouth 2 (two) times daily.     spironolactone   (ALDACTONE ) 25 MG tablet Take 25 mg by mouth every morning.     No current facility-administered medications on file prior to visit.   "

## 2024-06-19 NOTE — Addendum Note (Signed)
 Addended by: HOPE VEVA PARAS on: 06/19/2024 10:06 AM   Modules accepted: Orders

## 2024-06-20 LAB — PTH, INTACT AND CALCIUM
Calcium: 8.9 mg/dL (ref 8.6–10.3)
PTH: 109 pg/mL — ABNORMAL HIGH (ref 16–77)

## 2024-06-27 ENCOUNTER — Ambulatory Visit: Payer: Self-pay

## 2024-07-28 ENCOUNTER — Encounter

## 2024-08-10 ENCOUNTER — Ambulatory Visit (INDEPENDENT_AMBULATORY_CARE_PROVIDER_SITE_OTHER): Admitting: Vascular Surgery

## 2024-08-10 ENCOUNTER — Encounter (INDEPENDENT_AMBULATORY_CARE_PROVIDER_SITE_OTHER)
